# Patient Record
Sex: Female | Born: 1990
Health system: Southern US, Community
[De-identification: ages and names within clinical notes are randomized; demographics above are authoritative.]

## PROBLEM LIST (undated history)

## (undated) DIAGNOSIS — C801 Malignant (primary) neoplasm, unspecified: Secondary | ICD-10-CM

## (undated) HISTORY — PX: HERNIA REPAIR: SHX51

## (undated) HISTORY — PX: ANTERIOR CRUCIATE LIGAMENT REPAIR: SHX115

## (undated) HISTORY — PX: TONSILLECTOMY: SUR1361

---
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
## 2010-10-17 NOTE — Op Note (Signed)
NAME:  Leah Floyd, Leah Floyd NO.:  [id number]   MEDICAL RECORD NO.:  [id number]          PATIENT TYPE:  AMB   LOCATION:  DSC                          FACILITY:  [facility]   PHYSICIAN:  Thornton Park. Daphine Deutscher, MD  DATE OF BIRTH:  03-Jul-1990   DATE OF PROCEDURE:  02/17/2007  DATE OF DISCHARGE:                               OPERATIVE REPORT   PREOPERATIVE DIAGNOSIS:  Left inguinal hernia.   POSTOPERATIVE DIAGNOSIS:  Left indirect inguinal hernia.   PROCEDURE:  Left inguinal exploration, with opening and ligation of the  sac for an indirect hernia, closure of internal ring, division of  gubernaculum, and repair of floor with Ultrapro mesh.   SURGEON:  Thornton Park. Daphine Deutscher, M.D.   ASSISTANT:  None.   ANESTHESIA:  General.   DESCRIPTION OF PROCEDURE:  [name] was taken to room 8, [facility], and was given general by LMA.  The inguinal region was  clipped and then prepped with Techni-Care draped sterilely.  In the area  that we had marked and she had felt a bulge, I made a small transverse  incision and carried this down through the fatty tissue, opening her  external oblique from along the fibers down to the inguinal ligament.  Initially, the floor felt a little weak down there, but no obvious  indirect hernia was seen until I went ahead and dissected at the pubis  and then got around the very prominent wide cord, and I dissected it  proximally to the ring.  I opened it and then demonstrated a very nice  defined sac.  I took it distally and then peeled it away from the cord  structures and then ultimately ligating the distal gubernaculum with 2-0  Vicryl and then overclosing the internal portion of the sac with 2-0  Vicryl and then just tucking everything inside.  I then closed the  external oblique with a figure-of-eight suture of 2-0 Prolene.   Next, I cut a piece of Ultrapro mesh, and a little small piece was laid  down on the floor over the ring and again  tacked with 4 sutures of 2-0  Prolene.  All this did not encroach upon the nerve, which I separated,  and then closed the external oblique with running 2-0 Vicryl.  I  injected everything with 10 cc of 0.5% Marcaine.  Scarpa's was closed  with 4-0 Vicryl.  The wound was closed with 4-0 and 5-0 Vicryl in  layers, with a running subcuticular on the  outside, with Dermabond on the skin.  The patient tolerated the  procedure well.  She will be given Vicodin for pain, and is instructed  to return to the office in 2 weeks to discuss when she can go back to  playing basketball and her activities.      Thornton Park Daphine Deutscher, MD  Electronically Signed     MBM/MEDQ  D:  02/17/2007  T:  02/17/2007  Job:  [id number]   cc:   Clovis Riley, M.D.

## 2011-03-15 LAB — POCT HEMOGLOBIN-HEMACUE
Hemoglobin: 11.8 — ABNORMAL LOW
Operator id: 123881

## 2015-03-17 ENCOUNTER — Other Ambulatory Visit: Payer: Self-pay | Admitting: Family Medicine

## 2015-03-17 DIAGNOSIS — G4489 Other headache syndrome: Secondary | ICD-10-CM

## 2016-10-01 ENCOUNTER — Telehealth: Payer: Self-pay | Admitting: Hematology

## 2016-10-01 NOTE — Telephone Encounter (Signed)
Appt has been scheduled for the pt to see Dr. Irene Limbo on 5/1 at 230pm. Pt aware to arrive 30 minutes early. Demographics verified.

## 2016-10-02 ENCOUNTER — Encounter: Payer: Self-pay | Admitting: Hematology

## 2016-10-02 ENCOUNTER — Ambulatory Visit (HOSPITAL_BASED_OUTPATIENT_CLINIC_OR_DEPARTMENT_OTHER): Payer: Self-pay

## 2016-10-02 ENCOUNTER — Ambulatory Visit (HOSPITAL_BASED_OUTPATIENT_CLINIC_OR_DEPARTMENT_OTHER): Payer: Self-pay | Admitting: Hematology

## 2016-10-02 ENCOUNTER — Telehealth: Payer: Self-pay | Admitting: Hematology

## 2016-10-02 VITALS — BP 138/90 | HR 87 | Temp 98.4°F | Resp 18 | Ht 61.0 in | Wt 163.1 lb

## 2016-10-02 DIAGNOSIS — D509 Iron deficiency anemia, unspecified: Secondary | ICD-10-CM

## 2016-10-02 DIAGNOSIS — C8198 Hodgkin lymphoma, unspecified, lymph nodes of multiple sites: Secondary | ICD-10-CM

## 2016-10-02 DIAGNOSIS — J45909 Unspecified asthma, uncomplicated: Secondary | ICD-10-CM

## 2016-10-02 DIAGNOSIS — C8194 Hodgkin lymphoma, unspecified, lymph nodes of axilla and upper limb: Secondary | ICD-10-CM

## 2016-10-02 DIAGNOSIS — D649 Anemia, unspecified: Secondary | ICD-10-CM

## 2016-10-02 LAB — CBC & DIFF AND RETIC
BASO%: 0.2 % (ref 0.0–2.0)
Basophils Absolute: 0 10*3/uL (ref 0.0–0.1)
EOS%: 21.3 % — ABNORMAL HIGH (ref 0.0–7.0)
Eosinophils Absolute: 0.9 10*3/uL — ABNORMAL HIGH (ref 0.0–0.5)
HCT: 33.3 % — ABNORMAL LOW (ref 34.8–46.6)
HGB: 10.8 g/dL — ABNORMAL LOW (ref 11.6–15.9)
IMMATURE RETIC FRACT: 7.7 % (ref 1.60–10.00)
LYMPH#: 0.4 10*3/uL — AB (ref 0.9–3.3)
LYMPH%: 9.3 % — ABNORMAL LOW (ref 14.0–49.7)
MCH: 25.8 pg (ref 25.1–34.0)
MCHC: 32.4 g/dL (ref 31.5–36.0)
MCV: 79.7 fL (ref 79.5–101.0)
MONO#: 0.5 10*3/uL (ref 0.1–0.9)
MONO%: 10.2 % (ref 0.0–14.0)
NEUT%: 59 % (ref 38.4–76.8)
NEUTROS ABS: 2.6 10*3/uL (ref 1.5–6.5)
Platelets: 302 10*3/uL (ref 145–400)
RBC: 4.18 10*6/uL (ref 3.70–5.45)
RDW: 17.3 % — AB (ref 11.2–14.5)
RETIC %: 1.5 % (ref 0.70–2.10)
RETIC CT ABS: 62.7 10*3/uL (ref 33.70–90.70)
WBC: 4.4 10*3/uL (ref 3.9–10.3)

## 2016-10-02 LAB — COMPREHENSIVE METABOLIC PANEL
ALT: 8 U/L (ref 0–55)
AST: 13 U/L (ref 5–34)
Albumin: 3.2 g/dL — ABNORMAL LOW (ref 3.5–5.0)
Alkaline Phosphatase: 72 U/L (ref 40–150)
Anion Gap: 4 mEq/L (ref 3–11)
BUN: 6.2 mg/dL — ABNORMAL LOW (ref 7.0–26.0)
CO2: 26 meq/L (ref 22–29)
CREATININE: 0.7 mg/dL (ref 0.6–1.1)
Calcium: 9.3 mg/dL (ref 8.4–10.4)
Chloride: 104 mEq/L (ref 98–109)
EGFR: >90 mL/min/{1.73_m2} (ref >90)
GLUCOSE: 80 mg/dL (ref 70–140)
Potassium: 3.9 mEq/L (ref 3.5–5.1)
SODIUM: 135 meq/L — AB (ref 136–145)
TOTAL PROTEIN: 9.5 g/dL — AB (ref 6.4–8.3)

## 2016-10-02 LAB — LACTATE DEHYDROGENASE: LDH: 194 U/L (ref 125–245)

## 2016-10-02 NOTE — Patient Instructions (Signed)
Thank you for choosing [facility] to provide your oncology and hematology care.  To afford each patient quality time with our providers, please arrive 30 minutes before your scheduled appointment time.  If you arrive late for your appointment, you may be asked to reschedule.  We strive to give you quality time with our providers, and arriving late affects you and other patients whose appointments are after yours.  If you are a no show for multiple scheduled visits, you may be dismissed from the clinic at the providers discretion.   Again, thank you for choosing [facility], our hope is that these requests will decrease the amount of time that you wait before being seen by our physicians.  ______________________________________________________________________ Should you have questions after your visit to the [facility], please contact our office at [phone] between the hours of 8:30 and 4:30 p.m.    Voicemails left after 4:30p.m will not be returned until the following business day.   For prescription refill requests, please have your pharmacy contact us directly.  Please also try to allow 48 hours for prescription requests.   Please contact the scheduling department for questions regarding scheduling.  For scheduling of procedures such as PET scans, CT scans, MRI, Ultrasound, etc please contact central scheduling at [phone].   Resources For Cancer Patients and Caregivers:  American Cancer Society:  800-227-2345  Can help patients locate various types of support and financial assistance Cancer Care: 1-800-813-HOPE (4673) Provides financial assistance, online support groups, medication/co-pay assistance.   [place] DSS:  336-641-3447 Where to apply for food stamps, Medicaid, and utility assistance Medicare Rights Center: 800-333-4114 Helps people with Medicare understand their rights and benefits, navigate the Medicare system, and secure the  quality healthcare they deserve SCAT: 336-333-6589 [place] [employer]'s shared-ride transportation service for eligible riders who have a disability that prevents them from riding the fixed route bus.   For additional information on assistance programs please contact our social worker:   Grier Hock/Abigail Elmore:  [phone] 

## 2016-10-02 NOTE — Telephone Encounter (Signed)
Gave patient avs report and appointments for May, including PFT and echo. Central radiology will call re scans.

## 2016-10-02 NOTE — Progress Notes (Signed)
[name]    HEMATOLOGY/ONCOLOGY CONSULTATION NOTE  Date of Service: 10/02/2016  Patient Care Team: No Pcp Per Patient (General Practice)  CHIEF COMPLAINTS/PURPOSE OF CONSULTATION:  Newly diagnosed classical Hodgkin's lymphoma  HISTORY OF PRESENTING ILLNESS:   Leah Floyd is a wonderful 26 y.o. female who has been referred to [place] by [facility] for evaluation and management of newly diagnosed classical Hodgkin's lymphoma.  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  She notes that in November 2017 she developed generalized body aches and flulike symptoms with some subjective fevers chills and night sweats. A primary care physician noted a right axillary lump and she was treated empirically with Z-Pak for possible infection. She notes that around Christmas time in 2017 she developed worsening fatigue and generalized body aches a point that she was having difficulties getting through the day. She notes that she had an autoimmune workup HIV testing which was unrevealing she was noted to have some iron deficiency. She notes that she had a follow-up ultrasound that showed right axillary lymph node was enlarging and she also had a left axillary lymph node and had noted inguinal lymph nodes in the groin. Some of her workup was delayed due to insurance issues that she eventually had a left axillary lymph node biopsy on 09/24/2016 accession number [id number] which showed classical Hodgkin's lymphoma.  Patient then has moved back from [place] to be with her mother and family in [place] not, and is here to seek further cares for her classical Hodgkin's lymphoma newly diagnosed.  Patient notes significant fatigue, night sweats, weight loss of about 40 pounds in the last 4-5 months, generalized body aches,  dry cough and loss of appetite.  She notes that she previously had heavy menstrual periods lasting 7 days which are now become lighter and last about 4 days. She has been taking ferrous sulfate plus vitamin C for her iron deficiency anemia.  No previous heart problems. Has been having some mild diarrhea which she describes as soft stools 2-3 times a day.  MEDICAL HISTORY:   #1 iron deficiency anemia #2 GERD #3 obesity. #4 pseudotumor cerebri however this has resolved with her weight loss as per patient. #5 asthma #6 of newly diagnosed Hodgkin's lymphoma #7 shellfish allergy #8 history of Chlamydia infection   SURGICAL HISTORY: Past Surgical History:  Procedure Laterality Date  . ANTERIOR CRUCIATE LIGAMENT REPAIR     left  . HERNIA REPAIR     left    SOCIAL HISTORY: Social History   Social History  . Marital status: Single    Spouse name: N/A  . Number of children: N/A  . Years of education: N/A   Occupational History  . Not on file.   Social History Main Topics  . Smoking status: Never Smoker  . Smokeless tobacco: Never Used  . Alcohol use Yes     Comment: socially  . Drug use: Yes    Types: Marijuana     Comment: medical   . Sexual activity: Not on file   Other Topics Concern  . Not on file   Social History Narrative  . No narrative on file    FAMILY HISTORY: Notes her first cousin on her mom's side had some form off rare bone marrow cancer Does not know history on her father's side of the family Maternal grandmother-hypertension,  diabetes, fibromyalgia Mother anemia Other relatives on her mom's side with lymphoma and bladder cancer.  ALLERGIES:  is allergic to shellfish-derived products.  MEDICATIONS:  Current Outpatient Prescriptions  Medication Sig Dispense Refill  . Acetaminophen-Pamabrom (WOMENS TYLENOL PO) Take by mouth.    . Ascorbic Acid (VITAMIN C PO) Take by mouth.    . Cholecalciferol (VITAMIN D PO) Take by mouth.    . IRON PO Take  by mouth.     No current facility-administered medications for this visit.     REVIEW OF SYSTEMS:    10 Point review of Systems was done is negative except as noted above.  PHYSICAL EXAMINATION: ECOG PERFORMANCE STATUS: 1 - Symptomatic but completely ambulatory  . Vitals:   10/02/16 1437  BP: 138/90  Pulse: 87  Resp: 18  Temp: 98.4 F (36.9 C)   Filed Weights   10/02/16 1437  Weight: 163 lb 2 oz (74 kg)   .Body mass index is 30.82 kg/m.  GENERAL:alert, in no acute distress and comfortable SKIN: no acute rashes, no significant lesions EYES: conjunctiva are pink and non-injected, sclera anicteric OROPHARYNX: MMM, no exudates, no oropharyngeal erythema or ulceration NECK: supple, no JVD LYMPH:  Patient has palpable lymphadenopathy bilaterally in her neck , occipital area, supraclavicular, bilateral axillary and inguinal areas  LUNGS: clear to auscultation b/l with normal respiratory effort HEART: regular rate & rhythm ABDOMEN:  normoactive bowel sounds , non tender, not distended. Borderline palpable splenomegaly, no palpable hepatomegaly. Extremity: no pedal edema PSYCH: alert & oriented x 3 with fluent speech NEURO: no focal motor/sensory deficits  LABORATORY DATA:  I have reviewed the data as listed  . CBC Latest Ref Rng & Units 10/02/2016 02/17/2007  WBC 3.9 - 10.3 10e3/uL 4.4 -  Hemoglobin 11.6 - 15.9 g/dL 10.8(L) 11.8(L)  Hematocrit 34.8 - 46.6 % 33.3(L) -  Platelets 145 - 400 10e3/uL 302 -   . CMP Latest Ref Rng & Units 10/02/2016  Glucose 70 - 140 mg/dl 80  BUN 7.0 - 26.0 mg/dL 6.2(L)  Creatinine 0.6 - 1.1 mg/dL 0.7  Sodium 136 - 145 mEq/L 135(L)  Potassium 3.5 - 5.1 mEq/L 3.9  CO2 22 - 29 mEq/L 26  Calcium 8.4 - 10.4 mg/dL 9.3  Total Protein 6.4 - 8.3 g/dL 9.5(H)  Total Bilirubin 0.20 - 1.20 mg/dL <0.22  Alkaline Phos 40 - 150 U/L 72  AST 5 - 34 U/L 13  ALT 0 - 55 U/L 8   . Lab Results  Component Value Date   LDH 194 10/02/2016      RADIOGRAPHIC STUDIES: I have personally reviewed the radiological images as listed and agreed with the findings in the report. No results found.  ASSESSMENT & PLAN:   26 year old female with   #1 Classical Hodgkin's lymphoma  Clinically appears to be at least stage III B Patient has constitutional symptoms with significant weight loss of 40 pounds, and night sweats and debilitating fatigue. PLAN -Pathology results were reviewed in detail with the patient We discussed in detail diagnosis, natural history, prognosis based on likely stage, treatment option and potential adverse effects, possible impact on fertility etc. -Patient is agreeable with Port-A-Cath placement for chemotherapy -ordered -Labs today PET/CT ASAP in 5 days ECHO ASAP in 5 days PFT's in 5 days Chemo-counseling for ABVD for Hodgkins lymphoma in 1 week ABVD to be started in about 8-10 days RTC with Dr Irene Limbo C1D1 with labs  #2 fertility preservation -We discussed that ABVD regimen might have up to  a 10% chances of loss of fertility. -We discussed options for using Lupron could try to reduce ovarian injury. -We discussed options for egg preservation and local resources available. Patient is not inclined to pursue oocyte/egg preservation at this time. -She was counseled about the use of Lupron and the potential adverse effects and advantages. She wants to think about this and let us know.  #3 history of iron deficiency anemia Has been on oral iron that causes significant constipation Plan -We'll get iron studies and treat accordingly  #4 history of asthma. This has been mild but tends to get worse in [place]. Plan -she does not have a primary care physician at this time. 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 Patient notes that she tolerates eating shrimp. -Prescribed EpiPen for when necessary use. -Recommended also to carry  Benadryl.  #6 general medical cares -Patient was recommended to establish a primary care physician as soon as possible.   All, subjective fevers and chills and debilitating fatigue of the patients questions were answered with apparent satisfaction. The patient knows to call the clinic with any problems, questions or concerns.  I spent 60 minutes counseling the patient face to face. The total time spent in the appointment was 80 minutes and more than 50% was on counseling and direct patient cares.    Sullivan Lone MD [place] AAHIVMS [facility] Hematology/Oncology Physician [facility]  (Office):       [phone] (Work cell):  [phone] (Fax):           [phone]  10/02/2016 2:44 PM

## 2016-10-03 ENCOUNTER — Ambulatory Visit (HOSPITAL_COMMUNITY)
Admission: RE | Admit: 2016-10-03 | Discharge: 2016-10-03 | Disposition: A | Discharge status: Home / Self Care | Payer: Self-pay | Source: Ambulatory Visit | Attending: Hematology | Admitting: Hematology

## 2016-10-03 ENCOUNTER — Encounter: Payer: Self-pay | Admitting: *Deleted

## 2016-10-03 ENCOUNTER — Other Ambulatory Visit: Payer: Self-pay | Admitting: General Surgery

## 2016-10-03 DIAGNOSIS — C8198 Hodgkin lymphoma, unspecified, lymph nodes of multiple sites: Secondary | ICD-10-CM | POA: Insufficient documentation

## 2016-10-03 LAB — FERRITIN: FERRITIN: 10 ng/mL (ref 9–269)

## 2016-10-03 LAB — IRON AND TIBC
%SAT: 7 % — AB (ref 21–57)
IRON: 26 ug/dL — AB (ref 41–142)
TIBC: 349 ug/dL (ref 236–444)
UIBC: 323 ug/dL (ref 120–384)

## 2016-10-03 LAB — HEPATITIS B CORE ANTIBODY, TOTAL: Hep B Core Ab, Tot: NEGATIVE

## 2016-10-03 LAB — HEPATITIS C ANTIBODY: Hep C Virus Ab: <0.1 s/co ratio (ref 0.0–0.9)

## 2016-10-03 LAB — SEDIMENTATION RATE: Sedimentation Rate-Westergren: 54 mm/hr — ABNORMAL HIGH (ref 0–32)

## 2016-10-03 LAB — HIV ANTIBODY (ROUTINE TESTING W REFLEX): HIV SCREEN 4TH GENERATION: NONREACTIVE

## 2016-10-03 LAB — HEPATITIS B SURFACE ANTIGEN: HEP B S AG: NEGATIVE

## 2016-10-03 MED ORDER — EPINEPHRINE 0.3 MG/0.3ML IJ SOAJ: 0.3000 mg | Freq: Once | INTRAMUSCULAR | 0 refills | Status: AC

## 2016-10-03 MED ORDER — ALBUTEROL SULFATE HFA 108 (90 BASE) MCG/ACT IN AERS: 2.0000 | INHALATION_SPRAY | Freq: Four times a day (QID) | RESPIRATORY_TRACT | 2 refills | Status: AC | PRN

## 2016-10-03 NOTE — Progress Notes (Signed)
  Echocardiogram 2D Echocardiogram has been performed.  Roseanna Rainbow R 10/03/2016, 10:00 AM

## 2016-10-04 ENCOUNTER — Other Ambulatory Visit: Payer: Self-pay | Admitting: Student

## 2016-10-04 ENCOUNTER — Ambulatory Visit (HOSPITAL_COMMUNITY)
Admission: RE | Admit: 2016-10-04 | Discharge: 2016-10-04 | Disposition: A | Discharge status: Home / Self Care | Payer: Self-pay | Source: Ambulatory Visit | Attending: Hematology | Admitting: Hematology

## 2016-10-04 ENCOUNTER — Encounter: Payer: Self-pay | Admitting: *Deleted

## 2016-10-04 ENCOUNTER — Encounter (HOSPITAL_COMMUNITY): Payer: Self-pay

## 2016-10-04 ENCOUNTER — Other Ambulatory Visit: Payer: Self-pay | Admitting: Hematology

## 2016-10-04 DIAGNOSIS — C8198 Hodgkin lymphoma, unspecified, lymph nodes of multiple sites: Secondary | ICD-10-CM

## 2016-10-04 DIAGNOSIS — D509 Iron deficiency anemia, unspecified: Secondary | ICD-10-CM | POA: Insufficient documentation

## 2016-10-04 DIAGNOSIS — K219 Gastro-esophageal reflux disease without esophagitis: Secondary | ICD-10-CM | POA: Insufficient documentation

## 2016-10-04 DIAGNOSIS — C8190 Hodgkin lymphoma, unspecified, unspecified site: Secondary | ICD-10-CM | POA: Insufficient documentation

## 2016-10-04 DIAGNOSIS — J45909 Unspecified asthma, uncomplicated: Secondary | ICD-10-CM | POA: Insufficient documentation

## 2016-10-04 DIAGNOSIS — E669 Obesity, unspecified: Secondary | ICD-10-CM | POA: Insufficient documentation

## 2016-10-04 DIAGNOSIS — F1290 Cannabis use, unspecified, uncomplicated: Secondary | ICD-10-CM | POA: Insufficient documentation

## 2016-10-04 HISTORY — PX: IR US GUIDE VASC ACCESS RIGHT: IMG2390

## 2016-10-04 HISTORY — PX: IR FLUORO GUIDE PORT INSERTION RIGHT: IMG5741

## 2016-10-04 LAB — CBC WITH DIFFERENTIAL/PLATELET
BASOS PCT: 0 %
Basophils Absolute: 0 10*3/uL (ref 0.0–0.1)
EOS ABS: 1.2 10*3/uL — AB (ref 0.0–0.7)
Eosinophils Relative: 20 %
HCT: 33.4 % — ABNORMAL LOW (ref 36.0–46.0)
Hemoglobin: 10.8 g/dL — ABNORMAL LOW (ref 12.0–15.0)
Lymphocytes Relative: 11 %
Lymphs Abs: 0.7 10*3/uL (ref 0.7–4.0)
MCH: 25.5 pg — AB (ref 26.0–34.0)
MCHC: 32.3 g/dL (ref 30.0–36.0)
MCV: 79 fL (ref 78.0–100.0)
MONO ABS: 0.6 10*3/uL (ref 0.1–1.0)
MONOS PCT: 10 %
Neutro Abs: 3.5 10*3/uL (ref 1.7–7.7)
Neutrophils Relative %: 59 %
Platelets: 334 10*3/uL (ref 150–400)
RBC: 4.23 MIL/uL (ref 3.87–5.11)
RDW: 17.4 % — AB (ref 11.5–15.5)
WBC: 5.9 10*3/uL (ref 4.0–10.5)

## 2016-10-04 LAB — PROTIME-INR
INR: 1.13
Prothrombin Time: 14.6 seconds (ref 11.4–15.2)

## 2016-10-04 MED ORDER — HEPARIN SOD (PORK) LOCK FLUSH 100 UNIT/ML IV SOLN
INTRAVENOUS | Status: AC
  Administered 2016-10-04: 10:00:00
  Filled 2016-10-04: qty 5

## 2016-10-04 MED ORDER — FENTANYL CITRATE (PF) 100 MCG/2ML IJ SOLN
INTRAMUSCULAR | Status: AC
  Filled 2016-10-04: qty 8

## 2016-10-04 MED ORDER — MIDAZOLAM HCL 2 MG/2ML IJ SOLN
INTRAMUSCULAR | Status: AC | PRN
  Administered 2016-10-04 (×3): 1 mg via INTRAVENOUS

## 2016-10-04 MED ORDER — CEFAZOLIN SODIUM-DEXTROSE 2-4 GM/100ML-% IV SOLN
2.0000 g | INTRAVENOUS | Status: AC
  Administered 2016-10-04: 2 g via INTRAVENOUS

## 2016-10-04 MED ORDER — MIDAZOLAM HCL 2 MG/2ML IJ SOLN
INTRAMUSCULAR | Status: AC
  Filled 2016-10-04: qty 8

## 2016-10-04 MED ORDER — FENTANYL CITRATE (PF) 100 MCG/2ML IJ SOLN
INTRAMUSCULAR | Status: AC | PRN
  Administered 2016-10-04 (×2): 25 ug via INTRAVENOUS
  Administered 2016-10-04: 50 ug via INTRAVENOUS

## 2016-10-04 MED ORDER — SODIUM CHLORIDE 0.9 % IV SOLN
INTRAVENOUS | Status: DC
  Administered 2016-10-04: 08:00:00 via INTRAVENOUS

## 2016-10-04 MED ORDER — CEFAZOLIN SODIUM-DEXTROSE 2-4 GM/100ML-% IV SOLN
INTRAVENOUS | Status: AC
  Filled 2016-10-04: qty 100

## 2016-10-04 MED ORDER — LIDOCAINE-EPINEPHRINE (PF) 2 %-1:200000 IJ SOLN
INTRAMUSCULAR | Status: AC
  Filled 2016-10-04: qty 20

## 2016-10-04 MED ORDER — LIDOCAINE-EPINEPHRINE (PF) 2 %-1:200000 IJ SOLN
INTRAMUSCULAR | Status: AC | PRN
  Administered 2016-10-04: 10 mL via INTRADERMAL

## 2016-10-04 NOTE — Discharge Instructions (Signed)
Moderate Conscious Sedation, Adult, Care After °These instructions provide you with information about caring for yourself after your procedure. Your health care provider may also give you more specific instructions. Your treatment has been planned according to current medical practices, but problems sometimes occur. Call your health care provider if you have any problems or questions after your procedure. °What can I expect after the procedure? °After your procedure, it is common: °· To feel sleepy for several hours. °· To feel clumsy and have poor balance for several hours. °· To have poor judgment for several hours. °· To vomit if you eat too soon. °Follow these instructions at home: °For at least 24 hours after the procedure:  ° °· Do not: °¨ Participate in activities where you could fall or become injured. °¨ Drive. °¨ Use heavy machinery. °¨ Drink alcohol. °¨ Take sleeping pills or medicines that cause drowsiness. °¨ Make important decisions or sign legal documents. °¨ Take care of children on your own. °· Rest. °Eating and drinking  °· Follow the diet recommended by your health care provider. °· If you vomit: °¨ Drink water, juice, or soup when you can drink without vomiting. °¨ Make sure you have little or no nausea before eating solid foods. °General instructions  °· Have a responsible adult stay with you until you are awake and alert. °· Take over-the-counter and prescription medicines only as told by your health care provider. °· If you smoke, do not smoke without supervision. °· Keep all follow-up visits as told by your health care provider. This is important. °Contact a health care provider if: °· You keep feeling nauseous or you keep vomiting. °· You feel light-headed. °· You develop a rash. °· You have a fever. °Get help right away if: °· You have trouble breathing. °This information is not intended to replace advice given to you by your health care provider. Make sure you discuss any questions you  have with your health care provider. °Document Released: 03/11/2013 Document Revised: 10/24/2015 Document Reviewed: 09/10/2015 °Elsevier Interactive Patient Education © 2017 Elsevier Inc[employer] ° ° °Implanted Port Insertion, Care After °This sheet gives you information about how to care for yourself after your procedure. Your health care provider may also give you more specific instructions. If you have problems or questions, contact your health care provider. °What can I expect after the procedure? °After your procedure, it is common to have: °· Discomfort at the port insertion site. °· Bruising on the skin over the port. This should improve over 3-4 days. °Follow these instructions at home: °Port care  °· After your port is placed, you will get a manufacturer's information card. The card has information about your port. Keep this card with you at all times. °· Take care of the port as told by your health care provider. Ask your health care provider if you or a family member can get training for taking care of the port at home. A home health care nurse may also take care of the port. °· Make sure to remember what type of port you have. °Incision care  °· Follow instructions from your health care provider about how to take care of your port insertion site. Make sure you: °¨ Wash your hands with soap and water before you change your bandage (dressing). If soap and water are not available, use hand sanitizer. °¨ Change your dressing as told by your health care provider. °¨ Leave stitches (sutures), skin glue, or adhesive strips in place. These skin   closures may need to stay in place for 2 weeks or longer. If adhesive strip edges start to loosen and curl up, you may trim the loose edges. Do not remove adhesive strips completely unless your health care provider tells you to do that. °· Check your port insertion site every day for signs of infection. Check for: °¨ More redness, swelling, or pain. °¨ More fluid or  blood. °¨ Warmth. °¨ Pus or a bad smell. °General instructions  °· Do not take baths, swim, or use a hot tub until your health care provider approves. °· Do not lift anything that is heavier than 10 lb (4.5 kg) for a week, or as told by your health care provider. °· Ask your health care provider when it is okay to: °¨ Return to work or school. °¨ Resume usual physical activities or sports. °· Do not drive for 24 hours if you were given a medicine to help you relax (sedative). °· Take over-the-counter and prescription medicines only as told by your health care provider. °· Wear a medical alert bracelet in case of an emergency. This will tell any health care providers that you have a port. °· Keep all follow-up visits as told by your health care provider. This is important. °Contact a health care provider if: °· You cannot flush your port with saline as directed, or you cannot draw blood from the port. °· You have a fever or chills. °· You have more redness, swelling, or pain around your port insertion site. °· You have more fluid or blood coming from your port insertion site. °· Your port insertion site feels warm to the touch. °· You have pus or a bad smell coming from the port insertion site. °Get help right away if: °· You have chest pain or shortness of breath. °· You have bleeding from your port that you cannot control. °Summary °· Take care of the port as told by your health care provider. °· Change your dressing as told by your health care provider. °· Keep all follow-up visits as told by your health care provider. °This information is not intended to replace advice given to you by your health care provider. Make sure you discuss any questions you have with your health care provider. °Document Released: 03/11/2013 Document Revised: 04/11/2016 Document Reviewed: 04/11/2016 °Elsevier Interactive Patient Education © 2017 Elsevier Inc. ° °

## 2016-10-04 NOTE — Consult Note (Signed)
Chief Complaint: Patient was seen in consultation today for Port-A-Cath placement   Referring Physician(s): Brunetta Genera  Supervising Physician: Sandi Mariscal  Patient Status: [facility]  History of Present Illness: Leah Floyd is a 26 y.o. female with history of newly diagnosed classical Hodgkin's lymphoma who presents today for Port-A-Cath placement for chemotherapy. Past medical history also significant for obesity/pseudotumor cerebra which is now resolved with weight loss, iron deficiency anemia, asthma and GERD.  History reviewed. No pertinent past medical history.  Past Surgical History:  Procedure Laterality Date  . ANTERIOR CRUCIATE LIGAMENT REPAIR     left  . HERNIA REPAIR     left    Allergies: Shellfish-derived products  Medications: Prior to Admission medications   Medication Sig Start Date End Date Taking? Authorizing Provider  Acetaminophen-Pamabrom (WOMENS TYLENOL PO) Take by mouth.   Yes Historical Provider, MD  albuterol (PROVENTIL HFA;VENTOLIN HFA) 108 (90 Base) MCG/ACT inhaler Inhale 2 puffs into the lungs every 6 (six) hours as needed for wheezing or shortness of breath. 10/03/16  Yes Brunetta Genera, MD  Ascorbic Acid (VITAMIN C PO) Take by mouth.   Yes Historical Provider, MD  Cholecalciferol (VITAMIN D PO) Take by mouth.   Yes Historical Provider, MD  IRON PO Take by mouth.   Yes Historical Provider, MD  EPINEPHrine 0.3 mg/0.3 mL IJ SOAJ injection Inject 0.3 mLs (0.3 mg total) into the muscle once. For anaphylactic reactions 10/03/16 10/03/16  Brunetta Genera, MD     History reviewed. No pertinent family history.  Social History   Social History  . Marital status: Single    Spouse name: N/A  . Number of children: N/A  . Years of education: N/A   Social History Main Topics  . Smoking status: Never Smoker  . Smokeless tobacco: Never Used  . Alcohol use Yes     Comment: socially  . Drug use: Yes    Types: Marijuana   Comment: medical   . Sexual activity: Not Asked   Other Topics Concern  . None   Social History Narrative  . None      Review of Systems denies fever, headache, vomiting or abnormal bleeding. She does have occasional left-sided chest discomfort, some dyspnea with exertion, occasional cough, back pain, occasional nausea as well as some nosebleeds, weight loss and night sweats.  Vital Signs: BP (!) 147/81 (BP Location: Right Arm)   Pulse 85   Temp 98 F (36.7 C) (Oral)   Resp 16   SpO2 99%   Physical Exam awake, alert. Chest clear to auscultation bilaterally. Heart with regular rate and rhythm. Abdomen soft, positive bowel sounds, mild generalized tenderness, no lower extremity edema  Mallampati Score:     Imaging: No results found.  Labs:  CBC:  Recent Labs  10/02/16 1604 10/04/16 0747  WBC 4.4 5.9  HGB 10.8* 10.8*  HCT 33.3* 33.4*  PLT 302 334    COAGS:  Recent Labs  10/04/16 0747  INR 1.13    BMP:  Recent Labs  10/02/16 1604  NA 135*  K 3.9  CO2 26  GLUCOSE 80  BUN 6.2*  CALCIUM 9.3  CREATININE 0.7    LIVER FUNCTION TESTS:  Recent Labs  10/02/16 1604  BILITOT <0.22  AST 13  ALT 8  ALKPHOS 72  PROT 9.5*  ALBUMIN 3.2*    TUMOR MARKERS: No results for input(s): AFPTM, CEA, CA199, CHROMGRNA in the last 8760 hours.  Assessment and Plan: History of  newly diagnosed classical Hodgkin's lymphoma; plan today is for Port-A-Cath placement for chemotherapy.Risks and benefits discussed with the patient/mother including, but not limited to bleeding, infection, pneumothorax, or fibrin sheath development and need for additional procedures.All of the patient's questions were answered, patient is agreeable to proceed.Consent signed and in chart.     Thank you for this interesting consult.  I greatly enjoyed meeting Leah Floyd and look forward to participating in their care.  A copy of this report was sent to the requesting provider on this  date.  Electronically Signed: D. Rowe Robert 10/04/2016, 8:35 AM   I spent a total of 20 minutes  in face to face in clinical consultation, greater than 50% of which was counseling/coordinating care for Port-A-Cath placement

## 2016-10-04 NOTE — Procedures (Signed)
Pre Procedure Dx: Lymphoma Post Procedural Dx: Same  Successful placement of right IJ approach port-a-cath with tip at the superior caval atrial junction. The catheter is ready for immediate use.  Estimated Blood Loss: Minimal  Complications: None immediate.  Jay Charlesia Canaday, MD Pager #: [phone]   

## 2016-10-05 ENCOUNTER — Telehealth: Payer: Self-pay | Admitting: *Deleted

## 2016-10-05 ENCOUNTER — Ambulatory Visit (HOSPITAL_COMMUNITY)
Admission: RE | Admit: 2016-10-05 | Discharge: 2016-10-05 | Disposition: A | Discharge status: Home / Self Care | Payer: Self-pay | Source: Ambulatory Visit | Attending: Hematology | Admitting: Hematology

## 2016-10-05 ENCOUNTER — Other Ambulatory Visit: Payer: Self-pay

## 2016-10-05 DIAGNOSIS — J984 Other disorders of lung: Secondary | ICD-10-CM | POA: Insufficient documentation

## 2016-10-05 DIAGNOSIS — C8198 Hodgkin lymphoma, unspecified, lymph nodes of multiple sites: Secondary | ICD-10-CM

## 2016-10-05 LAB — PULMONARY FUNCTION TEST
DL/VA % PRED: 139 %
DL/VA: 6.14 ml/min/mmHg/L
DLCO COR % PRED: 135 %
DLCO cor: 27.42 ml/min/mmHg
DLCO unc % pred: 123 %
DLCO unc: 24.95 ml/min/mmHg
FEF 25-75 PRE: 4.69 L/s
FEF 25-75 Post: 4.66 L/sec
FEF2575-%Change-Post: 0 %
FEF2575-%Pred-Post: 148 %
FEF2575-%Pred-Pre: 149 %
FEV1-%Change-Post: 1 %
FEV1-%PRED-POST: 127 %
FEV1-%Pred-Pre: 125 %
FEV1-Post: 3.23 L
FEV1-Pre: 3.19 L
FEV1FVC-%CHANGE-POST: -1 %
FEV1FVC-%Pred-Pre: 110 %
FEV6-%CHANGE-POST: 4 %
FEV6-%Pred-Post: 119 %
FEV6-%Pred-Pre: 114 %
FEV6-Post: 3.44 L
FEV6-Pre: 3.3 L
FEV6FVC-%PRED-POST: 101 %
FEV6FVC-%PRED-PRE: 101 %
FVC-%CHANGE-POST: 3 %
FVC-%PRED-PRE: 114 %
FVC-%Pred-Post: 118 %
FVC-POST: 3.46 L
FVC-PRE: 3.36 L
POST FEV1/FVC RATIO: 93 %
POST FEV6/FVC RATIO: 100 %
PRE FEV1/FVC RATIO: 95 %
Pre FEV6/FVC Ratio: 100 %
RV % PRED: 113 %
RV: 1.32 L
TLC % pred: 100 %
TLC: 4.63 L

## 2016-10-05 MED ORDER — ALBUTEROL SULFATE (2.5 MG/3ML) 0.083% IN NEBU
2.5000 mg | INHALATION_SOLUTION | Freq: Once | RESPIRATORY_TRACT | Status: AC
  Administered 2016-10-05: 2.5 mg via RESPIRATORY_TRACT

## 2016-10-05 NOTE — Telephone Encounter (Signed)
Received call from [facility] with questions about PFT's  She is scheduled for today @ 11 am.  Does she need 6 min walk & mip/mep?  These don't really match her diagnosis.  Discussed with Dr Irene Limbo & pt does not need these two test.  Informed Tara/Respiratory.

## 2016-10-09 ENCOUNTER — Other Ambulatory Visit: Payer: Self-pay | Admitting: *Deleted

## 2016-10-10 ENCOUNTER — Other Ambulatory Visit: Payer: Self-pay | Admitting: Hematology

## 2016-10-10 ENCOUNTER — Encounter (HOSPITAL_COMMUNITY)
Admission: RE | Admit: 2016-10-10 | Discharge: 2016-10-10 | Disposition: A | Discharge status: Home / Self Care | Payer: Self-pay | Source: Ambulatory Visit | Attending: Hematology | Admitting: Hematology

## 2016-10-10 DIAGNOSIS — C8190 Hodgkin lymphoma, unspecified, unspecified site: Secondary | ICD-10-CM | POA: Insufficient documentation

## 2016-10-10 DIAGNOSIS — C8198 Hodgkin lymphoma, unspecified, lymph nodes of multiple sites: Secondary | ICD-10-CM

## 2016-10-10 LAB — GLUCOSE, CAPILLARY: Glucose-Capillary: 91 mg/dL (ref 65–99)

## 2016-10-10 MED ORDER — FLUDEOXYGLUCOSE F - 18 (FDG) INJECTION
8.8000 | Freq: Once | INTRAVENOUS | Status: AC | PRN
  Administered 2016-10-10: 8.8 via INTRAVENOUS

## 2016-10-10 NOTE — Progress Notes (Signed)
START ON PATHWAY REGIMEN - Lymphoma and CLL     A cycle is every 28 days:     Doxorubicin      Dacarbazine      Vinblastine      Bleomycin   **Always confirm dose/schedule in your pharmacy ordering system**    Patient Characteristics: Classic Hodgkin Lymphoma, First Line, Stage III / IV Disease Type: Classic Hodgkin Lymphoma Disease Type: Not Applicable Line of therapy: First Line [place] Stage: IVB  Intent of Therapy: Curative Intent, Discussed with Patient

## 2016-10-11 ENCOUNTER — Other Ambulatory Visit: Payer: Self-pay | Admitting: *Deleted

## 2016-10-11 DIAGNOSIS — C8190 Hodgkin lymphoma, unspecified, unspecified site: Secondary | ICD-10-CM

## 2016-10-12 ENCOUNTER — Ambulatory Visit (HOSPITAL_BASED_OUTPATIENT_CLINIC_OR_DEPARTMENT_OTHER): Payer: Self-pay | Admitting: Hematology

## 2016-10-12 ENCOUNTER — Other Ambulatory Visit (HOSPITAL_COMMUNITY)
Admission: RE | Admit: 2016-10-12 | Discharge: 2016-10-12 | Disposition: A | Discharge status: Home / Self Care | Payer: Self-pay | Source: Ambulatory Visit | Attending: Hematology | Admitting: Hematology

## 2016-10-12 ENCOUNTER — Encounter: Payer: Self-pay | Admitting: Hematology

## 2016-10-12 ENCOUNTER — Ambulatory Visit (HOSPITAL_BASED_OUTPATIENT_CLINIC_OR_DEPARTMENT_OTHER): Payer: Self-pay

## 2016-10-12 ENCOUNTER — Telehealth: Payer: Self-pay | Admitting: Hematology

## 2016-10-12 ENCOUNTER — Other Ambulatory Visit (HOSPITAL_BASED_OUTPATIENT_CLINIC_OR_DEPARTMENT_OTHER): Payer: Self-pay

## 2016-10-12 VITALS — BP 139/78 | HR 73 | Temp 98.3°F | Resp 16

## 2016-10-12 VITALS — BP 134/92 | HR 66 | Temp 98.5°F | Resp 19 | Ht 61.0 in | Wt 162.2 lb

## 2016-10-12 DIAGNOSIS — C8198 Hodgkin lymphoma, unspecified, lymph nodes of multiple sites: Secondary | ICD-10-CM

## 2016-10-12 DIAGNOSIS — Z5111 Encounter for antineoplastic chemotherapy: Secondary | ICD-10-CM

## 2016-10-12 DIAGNOSIS — C8190 Hodgkin lymphoma, unspecified, unspecified site: Secondary | ICD-10-CM

## 2016-10-12 DIAGNOSIS — D500 Iron deficiency anemia secondary to blood loss (chronic): Secondary | ICD-10-CM | POA: Insufficient documentation

## 2016-10-12 DIAGNOSIS — C8194 Hodgkin lymphoma, unspecified, lymph nodes of axilla and upper limb: Secondary | ICD-10-CM

## 2016-10-12 DIAGNOSIS — D509 Iron deficiency anemia, unspecified: Secondary | ICD-10-CM

## 2016-10-12 LAB — CBC WITH DIFFERENTIAL/PLATELET
BASO%: 0.4 % (ref 0.0–2.0)
Basophils Absolute: 0 10*3/uL (ref 0.0–0.1)
EOS ABS: 1.1 10*3/uL — AB (ref 0.0–0.5)
EOS%: 23.7 % — ABNORMAL HIGH (ref 0.0–7.0)
HCT: 32.1 % — ABNORMAL LOW (ref 34.8–46.6)
HEMOGLOBIN: 10.2 g/dL — AB (ref 11.6–15.9)
LYMPH%: 10.9 % — ABNORMAL LOW (ref 14.0–49.7)
MCH: 25.8 pg (ref 25.1–34.0)
MCHC: 31.8 g/dL (ref 31.5–36.0)
MCV: 81.1 fL (ref 79.5–101.0)
MONO#: 0.4 10*3/uL (ref 0.1–0.9)
MONO%: 8.8 % (ref 0.0–14.0)
NEUT%: 56.2 % (ref 38.4–76.8)
NEUTROS ABS: 2.6 10*3/uL (ref 1.5–6.5)
Platelets: 247 10*3/uL (ref 145–400)
RBC: 3.96 10*6/uL (ref 3.70–5.45)
RDW: 18 % — AB (ref 11.2–14.5)
WBC: 4.7 10*3/uL (ref 3.9–10.3)
lymph#: 0.5 10*3/uL — ABNORMAL LOW (ref 0.9–3.3)

## 2016-10-12 LAB — COMPREHENSIVE METABOLIC PANEL
ALBUMIN: 2.9 g/dL — AB (ref 3.5–5.0)
ALK PHOS: 81 U/L (ref 40–150)
ALT: 8 U/L (ref 0–55)
AST: 13 U/L (ref 5–34)
Anion Gap: 4 mEq/L (ref 3–11)
BUN: 5.7 mg/dL — ABNORMAL LOW (ref 7.0–26.0)
CO2: 27 mEq/L (ref 22–29)
CREATININE: 0.7 mg/dL (ref 0.6–1.1)
Calcium: 9.2 mg/dL (ref 8.4–10.4)
Chloride: 105 mEq/L (ref 98–109)
EGFR: >90 mL/min/{1.73_m2} (ref >90)
GLUCOSE: 89 mg/dL (ref 70–140)
Potassium: 4.3 mEq/L (ref 3.5–5.1)
Sodium: 136 mEq/L (ref 136–145)
TOTAL PROTEIN: 9 g/dL — AB (ref 6.4–8.3)

## 2016-10-12 LAB — PREGNANCY, URINE: Preg Test, Ur: NEGATIVE

## 2016-10-12 MED ORDER — DEXAMETHASONE 4 MG PO TABS: ORAL_TABLET | ORAL | 1 refills | Status: DC

## 2016-10-12 MED ORDER — PROCHLORPERAZINE MALEATE 10 MG PO TABS: 10.0000 mg | ORAL_TABLET | Freq: Four times a day (QID) | ORAL | 1 refills | Status: DC | PRN

## 2016-10-12 MED ORDER — ALBUTEROL SULFATE (2.5 MG/3ML) 0.083% IN NEBU
INHALATION_SOLUTION | RESPIRATORY_TRACT | Status: AC
  Filled 2016-10-12: qty 3

## 2016-10-12 MED ORDER — HYDROCODONE-ACETAMINOPHEN 5-325 MG PO TABS: 1.0000 | ORAL_TABLET | Freq: Four times a day (QID) | ORAL | 0 refills | Status: DC | PRN

## 2016-10-12 MED ORDER — SENNOSIDES-DOCUSATE SODIUM 8.6-50 MG PO TABS: 2.0000 | ORAL_TABLET | Freq: Every day | ORAL | 1 refills | Status: DC

## 2016-10-12 MED ORDER — LIDOCAINE-PRILOCAINE 2.5-2.5 % EX CREA: TOPICAL_CREAM | CUTANEOUS | 3 refills | Status: DC

## 2016-10-12 MED ORDER — DOXORUBICIN HCL CHEMO IV INJECTION 2 MG/ML
25.0000 mg/m2 | Freq: Once | INTRAVENOUS | Status: AC
  Administered 2016-10-12: 44 mg via INTRAVENOUS
  Filled 2016-10-12: qty 22

## 2016-10-12 MED ORDER — PALONOSETRON HCL INJECTION 0.25 MG/5ML
INTRAVENOUS | Status: AC
  Filled 2016-10-12: qty 5

## 2016-10-12 MED ORDER — ALBUTEROL SULFATE (2.5 MG/3ML) 0.083% IN NEBU
2.5000 mg | INHALATION_SOLUTION | Freq: Once | RESPIRATORY_TRACT | Status: AC
  Administered 2016-10-12: 2.5 mg via RESPIRATORY_TRACT
  Filled 2016-10-12: qty 3

## 2016-10-12 MED ORDER — SODIUM CHLORIDE 0.9 % IV SOLN
10.0000 [IU]/m2 | Freq: Once | INTRAVENOUS | Status: AC
  Administered 2016-10-12: 18 [IU] via INTRAVENOUS
  Filled 2016-10-12: qty 6

## 2016-10-12 MED ORDER — DACARBAZINE 200 MG IV SOLR
375.0000 mg/m2 | Freq: Once | INTRAVENOUS | Status: AC
  Administered 2016-10-12: 660 mg via INTRAVENOUS
  Filled 2016-10-12: qty 33

## 2016-10-12 MED ORDER — VINBLASTINE SULFATE CHEMO INJECTION 1 MG/ML
5.6000 mg/m2 | Freq: Once | INTRAVENOUS | Status: AC
  Administered 2016-10-12: 10 mg via INTRAVENOUS
  Filled 2016-10-12: qty 10

## 2016-10-12 MED ORDER — SODIUM CHLORIDE 0.9% FLUSH
10.0000 mL | INTRAVENOUS | Status: DC | PRN
  Administered 2016-10-12: 10 mL
  Filled 2016-10-12: qty 10

## 2016-10-12 MED ORDER — SODIUM CHLORIDE 0.9 % IV SOLN
Freq: Once | INTRAVENOUS | Status: AC
  Administered 2016-10-12: 12:00:00 via INTRAVENOUS

## 2016-10-12 MED ORDER — PALONOSETRON HCL INJECTION 0.25 MG/5ML
0.2500 mg | Freq: Once | INTRAVENOUS | Status: AC
Start: 2016-10-12 — End: 2016-10-12
  Administered 2016-10-12: 0.25 mg via INTRAVENOUS

## 2016-10-12 MED ORDER — SODIUM CHLORIDE 0.9 % IV SOLN
Freq: Once | INTRAVENOUS | Status: AC
  Administered 2016-10-12: 12:00:00 via INTRAVENOUS
  Filled 2016-10-12: qty 5

## 2016-10-12 MED ORDER — HEPARIN SOD (PORK) LOCK FLUSH 100 UNIT/ML IV SOLN
500.0000 [IU] | Freq: Once | INTRAVENOUS | Status: AC | PRN
  Administered 2016-10-12: 500 [IU]
  Filled 2016-10-12: qty 5

## 2016-10-12 MED ORDER — ONDANSETRON HCL 8 MG PO TABS
8.0000 mg | ORAL_TABLET | Freq: Two times a day (BID) | ORAL | 1 refills | Status: DC | PRN
Start: 2016-10-12 — End: 2017-06-17

## 2016-10-12 MED ORDER — LORAZEPAM 0.5 MG PO TABS: 0.5000 mg | ORAL_TABLET | Freq: Four times a day (QID) | ORAL | 0 refills | Status: DC | PRN

## 2016-10-12 MED FILL — HYDROCODON-APAP 5-325: 5-325 | 3 days supply | Qty: 30 | Fill #0

## 2016-10-12 MED FILL — PROCHLORPERAZINE 10 MG TAB: 10 | 7 days supply | Qty: 30 | Fill #0

## 2016-10-12 MED FILL — DEXAMETHASONE 4 MG TABLET: 4 | 15 days supply | Qty: 30 | Fill #0

## 2016-10-12 MED FILL — ONDANSETRON HCL 8 MG TABLET: 8 | 15 days supply | Qty: 30 | Fill #0

## 2016-10-12 MED FILL — VENTOLIN HFA 90 MCG INHALER: 108 (90 BAS | 25 days supply | Qty: 18 | Fill #0

## 2016-10-12 MED FILL — SENNA PLUS 8.6-50 MG TAB: 8.6-50 | 30 days supply | Qty: 60 | Fill #0

## 2016-10-12 MED FILL — EPINEPHRINE 0.3 MG AUTO-INJ: 0.3 | 30 days supply | Qty: 2 | Fill #0

## 2016-10-12 MED FILL — LORazepam 0.5 MG TABS: 0.5 | 7 days supply | Qty: 30 | Fill #0

## 2016-10-12 NOTE — Patient Instructions (Signed)
[facility] Discharge Instructions for Patients Receiving Chemotherapy  Today you received the following chemotherapy agents Adriamycin, Vinblastine, Bleomycin and DTIC  To help prevent nausea and vomiting after your treatment, we encourage you to take your nausea medication as directed. No Zofran for 3 days. Take Compazine instead.    If you develop nausea and vomiting that is not controlled by your nausea medication, call the clinic.   BELOW ARE SYMPTOMS THAT SHOULD BE REPORTED IMMEDIATELY:  *FEVER GREATER THAN 100.5 F  *CHILLS WITH OR WITHOUT FEVER  NAUSEA AND VOMITING THAT IS NOT CONTROLLED WITH YOUR NAUSEA MEDICATION  *UNUSUAL SHORTNESS OF BREATH  *UNUSUAL BRUISING OR BLEEDING  TENDERNESS IN MOUTH AND THROAT WITH OR WITHOUT PRESENCE OF ULCERS  *URINARY PROBLEMS  *BOWEL PROBLEMS  UNUSUAL RASH Items with * indicate a potential emergency and should be followed up as soon as possible.  Feel free to call the clinic you have any questions or concerns. The clinic phone number is [phone].  Please show the [place] [facility] at check-in to the Emergency Department and triage nurse.  Doxorubicin injection What is this medicine? DOXORUBICIN (dox oh ROO bi sin) is a chemotherapy drug. It is used to treat many kinds of cancer like leukemia, lymphoma, neuroblastoma, sarcoma, and Wilms' tumor. It is also used to treat bladder cancer, breast cancer, lung cancer, ovarian cancer, stomach cancer, and thyroid cancer. This medicine may be used for other purposes; ask your health care provider or pharmacist if you have questions. COMMON BRAND NAME(S): Adriamycin, Adriamycin PFS, Adriamycin RDF, Rubex What should I tell my health care provider before I take this medicine? They need to know if you have any of these conditions: -heart disease -history of low blood counts caused by a medicine -liver disease -recent or ongoing radiation therapy -an unusual or allergic  reaction to doxorubicin, other chemotherapy agents, other medicines, foods, dyes, or preservatives -pregnant or trying to get pregnant -breast-feeding How should I use this medicine? This drug is given as an infusion into a vein. It is administered in a hospital or clinic by a specially trained health care professional. If you have pain, swelling, burning or any unusual feeling around the site of your injection, tell your health care professional right away. Talk to your pediatrician regarding the use of this medicine in children. Special care may be needed. Overdosage: If you think you have taken too much of this medicine contact a poison control center or emergency room at once. NOTE: This medicine is only for you. Do not share this medicine with others. What if I miss a dose? It is important not to miss your dose. Call your doctor or health care professional if you are unable to keep an appointment. What may interact with this medicine? This medicine may interact with the following medications: -6-mercaptopurine -paclitaxel -phenytoin -St. John's Wort -trastuzumab -verapamil This list may not describe all possible interactions. Give your health care provider a list of all the medicines, herbs, non-prescription drugs, or dietary supplements you use. Also tell them if you smoke, drink alcohol, or use illegal drugs. Some items may interact with your medicine. What should I watch for while using this medicine? This drug may make you feel generally unwell. This is not uncommon, as chemotherapy can affect healthy cells as well as cancer cells. Report any side effects. Continue your course of treatment even though you feel ill unless your doctor tells you to stop. There is a maximum amount of this medicine  you should receive throughout your life. The amount depends on the medical condition being treated and your overall health. Your doctor will watch how much of this medicine you receive in your  lifetime. Tell your doctor if you have taken this medicine before. You may need blood work done while you are taking this medicine. Your urine may turn red for a few days after your dose. This is not blood. If your urine is dark or brown, call your doctor. In some cases, you may be given additional medicines to help with side effects. Follow all directions for their use. Call your doctor or health care professional for advice if you get a fever, chills or sore throat, or other symptoms of a cold or flu. Do not treat yourself. This drug decreases your body's ability to fight infections. Try to avoid being around people who are sick. This medicine may increase your risk to bruise or bleed. Call your doctor or health care professional if you notice any unusual bleeding. Talk to your doctor about your risk of cancer. You may be more at risk for certain types of cancers if you take this medicine. Do not become pregnant while taking this medicine or for 6 months after stopping it. Women should inform their doctor if they wish to become pregnant or think they might be pregnant. Men should not father a child while taking this medicine and for 6 months after stopping it. There is a potential for serious side effects to an unborn child. Talk to your health care professional or pharmacist for more information. Do not breast-feed an infant while taking this medicine. This medicine has caused ovarian failure in some women and reduced sperm counts in some men This medicine may interfere with the ability to have a child. Talk with your doctor or health care professional if you are concerned about your fertility. What side effects may I notice from receiving this medicine? Side effects that you should report to your doctor or health care professional as soon as possible: -allergic reactions like skin rash, itching or hives, swelling of the face, lips, or tongue -breathing problems -chest pain -fast or irregular  heartbeat -low blood counts - this medicine may decrease the number of white blood cells, red blood cells and platelets. You may be at increased risk for infections and bleeding. -pain, redness, or irritation at site where injected -signs of infection - fever or chills, cough, sore throat, pain or difficulty passing urine -signs of decreased platelets or bleeding - bruising, pinpoint red spots on the skin, black, tarry stools, blood in the urine -swelling of the ankles, feet, hands -tiredness -weakness Side effects that usually do not require medical attention (report to your doctor or health care professional if they continue or are bothersome): -diarrhea -hair loss -mouth sores -nail discoloration or damage -nausea -red colored urine -vomiting This list may not describe all possible side effects. Call your doctor for medical advice about side effects. You may report side effects to FDA at 1-800-FDA-1088. Where should I keep my medicine? This drug is given in a hospital or clinic and will not be stored at home. NOTE: This sheet is a summary. It may not cover all possible information. If you have questions about this medicine, talk to your doctor, pharmacist, or health care provider.  2018 Elsevier/Gold Standard (2015-07-18 11:28:51)  Vinblastine injection What is this medicine? VINBLASTINE (vin BLAS teen) is a chemotherapy drug. It slows the growth of cancer cells. This medicine is used  to treat many types of cancer like breast cancer, testicular cancer, Hodgkin's disease, non-Hodgkin's lymphoma, and sarcoma. This medicine may be used for other purposes; ask your health care provider or pharmacist if you have questions. COMMON BRAND NAME(S): Velban What should I tell my health care provider before I take this medicine? They need to know if you have any of these conditions: -blood disorders -dental disease -gout -infection (especially a virus infection such as chickenpox, cold sores,  or herpes) -liver disease -lung disease -nervous system disease -recent or ongoing radiation therapy -an unusual or allergic reaction to vinblastine, other chemotherapy agents, other medicines, foods, dyes, or preservatives -pregnant or trying to get pregnant -breast-feeding How should I use this medicine? This drug is given as an infusion into a vein. It is administered in a hospital or clinic by a specially trained health care professional. If you have pain, swelling, burning or any unusual feeling around the site of your injection, tell your health care professional right away. Talk to your pediatrician regarding the use of this medicine in children. While this drug may be prescribed for selected conditions, precautions do apply. Overdosage: If you think you have taken too much of this medicine contact a poison control center or emergency room at once. NOTE: This medicine is only for you. Do not share this medicine with others. What if I miss a dose? It is important not to miss your dose. Call your doctor or health care professional if you are unable to keep an appointment. What may interact with this medicine? Do not take this medicine with any of the following medications: -erythromycin -itraconazole -mibefradil -voriconazole This medicine may also interact with the following medications: -cyclosporine -fluconazole -ketoconazole -medicines for seizures like phenytoin -medicines to increase blood counts like filgrastim, pegfilgrastim, sargramostim -vaccines -verapamil Talk to your doctor or health care professional before taking any of these medicines: -acetaminophen -aspirin -ibuprofen -ketoprofen -naproxen This list may not describe all possible interactions. Give your health care provider a list of all the medicines, herbs, non-prescription drugs, or dietary supplements you use. Also tell them if you smoke, drink alcohol, or use illegal drugs. Some items may interact with  your medicine. What should I watch for while using this medicine? Your condition will be monitored carefully while you are receiving this medicine. You will need important blood work done while you are taking this medicine. This drug may make you feel generally unwell. This is not uncommon, as chemotherapy can affect healthy cells as well as cancer cells. Report any side effects. Continue your course of treatment even though you feel ill unless your doctor tells you to stop. In some cases, you may be given additional medicines to help with side effects. Follow all directions for their use. Call your doctor or health care professional for advice if you get a fever, chills or sore throat, or other symptoms of a cold or flu. Do not treat yourself. This drug decreases your body's ability to fight infections. Try to avoid being around people who are sick. This medicine may increase your risk to bruise or bleed. Call your doctor or health care professional if you notice any unusual bleeding. Be careful brushing and flossing your teeth or using a toothpick because you may get an infection or bleed more easily. If you have any dental work done, tell your dentist you are receiving this medicine. Avoid taking products that contain aspirin, acetaminophen, ibuprofen, naproxen, or ketoprofen unless instructed by your doctor. These medicines may hide  a fever. Do not become pregnant while taking this medicine. Women should inform their doctor if they wish to become pregnant or think they might be pregnant. There is a potential for serious side effects to an unborn child. Talk to your health care professional or pharmacist for more information. Do not breast-feed an infant while taking this medicine. Men may have a lower sperm count while taking this medicine. Talk to your doctor if you plan to father a child. What side effects may I notice from receiving this medicine? Side effects that you should report to your doctor  or health care professional as soon as possible: -allergic reactions like skin rash, itching or hives, swelling of the face, lips, or tongue -low blood counts - This drug may decrease the number of white blood cells, red blood cells and platelets. You may be at increased risk for infections and bleeding. -signs of infection - fever or chills, cough, sore throat, pain or difficulty passing urine -signs of decreased platelets or bleeding - bruising, pinpoint red spots on the skin, black, tarry stools, nosebleeds -signs of decreased red blood cells - unusually weak or tired, fainting spells, lightheadedness -breathing problems -changes in hearing -change in the amount of urine -chest pain -high blood pressure -mouth sores -nausea and vomiting -pain, swelling, redness or irritation at the injection site -pain, tingling, numbness in the hands or feet -problems with balance, dizziness -seizures Side effects that usually do not require medical attention (report to your doctor or health care professional if they continue or are bothersome): -constipation -hair loss -jaw pain -loss of appetite -sensitivity to light -stomach pain -tumor pain This list may not describe all possible side effects. Call your doctor for medical advice about side effects. You may report side effects to FDA at 1-800-FDA-1088. Where should I keep my medicine? This drug is given in a hospital or clinic and will not be stored at home. NOTE: This sheet is a summary. It may not cover all possible information. If you have questions about this medicine, talk to your doctor, pharmacist, or health care provider.  2018 Elsevier/Gold Standard (2008-02-16 17:15:59)  Bleomycin injection What is this medicine? BLEOMYCIN (blee oh MYE sin) is a chemotherapy drug. It is used to treat many kinds of cancer like lymphoma, cervical cancer, head and neck cancer, and testicular cancer. It is also used to prevent and to treat fluid  build-up around the lungs caused by some cancers. This medicine may be used for other purposes; ask your health care provider or pharmacist if you have questions. COMMON BRAND NAME(S): Blenoxane What should I tell my health care provider before I take this medicine? They need to know if you have any of these conditions: -cigarette smoker -kidney disease -lung disease -recent or ongoing radiation therapy -an unusual or allergic reaction to bleomycin, other chemotherapy agents, other medicines, foods, dyes, or preservatives -pregnant or trying to get pregnant -breast-feeding How should I use this medicine? This drug is given as an infusion into a vein or a body cavity. It can also be given as an injection into a muscle or under the skin. It is administered in a hospital or clinic by a specially trained health care professional. Talk to your pediatrician regarding the use of this medicine in children. Special care may be needed. Overdosage: If you think you have taken too much of this medicine contact a poison control center or emergency room at once. NOTE: This medicine is only for you. Do not share  this medicine with others. What if I miss a dose? It is important not to miss your dose. Call your doctor or health care professional if you are unable to keep an appointment. What may interact with this medicine? -certain antibiotics given by injection -cisplatin -cyclosporine -diuretics -foscarnet -medicines to increase blood counts like filgrastim, pegfilgrastim, sargramostim -vaccines This list may not describe all possible interactions. Give your health care provider a list of all the medicines, herbs, non-prescription drugs, or dietary supplements you use. Also tell them if you smoke, drink alcohol, or use illegal drugs. Some items may interact with your medicine. What should I watch for while using this medicine? Visit your doctor for checks on your progress. This drug may make you feel  generally unwell. This is not uncommon, as chemotherapy can affect healthy cells as well as cancer cells. Report any side effects. Continue your course of treatment even though you feel ill unless your doctor tells you to stop. Call your doctor or health care professional for advice if you get a fever, chills or sore throat, or other symptoms of a cold or flu. Do not treat yourself. This drug decreases your body's ability to fight infections. Try to avoid being around people who are sick. Avoid taking products that contain aspirin, acetaminophen, ibuprofen, naproxen, or ketoprofen unless instructed by your doctor. These medicines may hide a fever. Do not become pregnant while taking this medicine. Women should inform their doctor if they wish to become pregnant or think they might be pregnant. There is a potential for serious side effects to an unborn child. Talk to your health care professional or pharmacist for more information. Do not breast-feed an infant while taking this medicine. There is a maximum amount of this medicine you should receive throughout your life. The amount depends on the medical condition being treated and your overall health. Your doctor will watch how much of this medicine you receive in your lifetime. Tell your doctor if you have taken this medicine before. What side effects may I notice from receiving this medicine? Side effects that you should report to your doctor or health care professional as soon as possible: -allergic reactions like skin rash, itching or hives, swelling of the face, lips, or tongue -breathing problems -chest pain -confusion -cough -fast, irregular heartbeat -feeling faint or lightheaded, falls -fever or chills -mouth sores -pain, tingling, numbness in the hands or feet -trouble passing urine or change in the amount of urine -yellowing of the eyes or skin Side effects that usually do not require medical attention (report to your doctor or health  care professional if they continue or are bothersome): -darker skin color -hair loss -irritation at site where injected -loss of appetite -nail changes -nausea and vomiting -weight loss This list may not describe all possible side effects. Call your doctor for medical advice about side effects. You may report side effects to FDA at 1-800-FDA-1088. Where should I keep my medicine? This drug is given in a hospital or clinic and will not be stored at home. NOTE: This sheet is a summary. It may not cover all possible information. If you have questions about this medicine, talk to your doctor, pharmacist, or health care provider.  2018 Else[employer]/Gold Standard (2012-09-16 09:36:48)  Dacarbazine, DTIC injection What is this medicine? DACARBAZINE (da KAR ba zeen) is a chemotherapy drug. This medicine is used to treat skin cancer. It is also used with other medicines to treat Hodgkin's disease. This medicine may be used for other  purposes; ask your health care provider or pharmacist if you have questions. COMMON BRAND NAME(S): DTIC-Dome What should I tell my health care provider before I take this medicine? They need to know if you have any of these conditions: -infection (especially virus infection such as chickenpox, cold sores, or herpes) -kidney disease -liver disease -low blood counts like low platelets, red blood cells, white blood cells -recent radiation therapy -an unusual or allergic reaction to dacarbazine, other chemotherapy agents, other medicines, foods, dyes, or preservatives -pregnant or trying to get pregnant -breast-feeding How should I use this medicine? This drug is given as an injection or infusion into a vein. It is administered in a hospital or clinic by a specially trained health care professional. Talk to your pediatrician regarding the use of this medicine in children. While this drug may be prescribed for selected conditions, precautions do apply. Overdosage: If you  think you have taken too much of this medicine contact a poison control center or emergency room at once. NOTE: This medicine is only for you. Do not share this medicine with others. What if I miss a dose? It is important not to miss your dose. Call your doctor or health care professional if you are unable to keep an appointment. What may interact with this medicine? -medicines to increase blood counts like filgrastim, pegfilgrastim, sargramostim -vaccines This list may not describe all possible interactions. Give your health care provider a list of all the medicines, herbs, non-prescription drugs, or dietary supplements you use. Also tell them if you smoke, drink alcohol, or use illegal drugs. Some items may interact with your medicine. What should I watch for while using this medicine? Your condition will be monitored carefully while you are receiving this medicine. You will need important blood work done while you are taking this medicine. This drug may make you feel generally unwell. This is not uncommon, as chemotherapy can affect healthy cells as well as cancer cells. Report any side effects. Continue your course of treatment even though you feel ill unless your doctor tells you to stop. Call your doctor or health care professional for advice if you get a fever, chills or sore throat, or other symptoms of a cold or flu. Do not treat yourself. This drug decreases your body's ability to fight infections. Try to avoid being around people who are sick. This medicine may increase your risk to bruise or bleed. Call your doctor or health care professional if you notice any unusual bleeding. Talk to your doctor about your risk of cancer. You may be more at risk for certain types of cancers if you take this medicine. Do not become pregnant while taking this medicine. Women should inform their doctor if they wish to become pregnant or think they might be pregnant. There is a potential for serious side  effects to an unborn child. Talk to your health care professional or pharmacist for more information. Do not breast-feed an infant while taking this medicine. What side effects may I notice from receiving this medicine? Side effects that you should report to your doctor or health care professional as soon as possible: -allergic reactions like skin rash, itching or hives, swelling of the face, lips, or tongue -low blood counts - this medicine may decrease the number of white blood cells, red blood cells and platelets. You may be at increased risk for infections and bleeding. -signs of infection - fever or chills, cough, sore throat, pain or difficulty passing urine -signs of decreased platelets  or bleeding - bruising, pinpoint red spots on the skin, black, tarry stools, blood in the urine -signs of decreased red blood cells - unusually weak or tired, fainting spells, lightheadedness -breathing problems -muscle pains -pain at site where injected -trouble passing urine or change in the amount of urine -vomiting -yellowing of the eyes or skin Side effects that usually do not require medical attention (report to your doctor or health care professional if they continue or are bothersome): -diarrhea -hair loss -loss of appetite -nausea -skin more sensitive to sun or ultraviolet light -stomach upset This list may not describe all possible side effects. Call your doctor for medical advice about side effects. You may report side effects to FDA at 1-800-FDA-1088. Where should I keep my medicine? This drug is given in a hospital or clinic and will not be stored at home. NOTE: This sheet is a summary. It may not cover all possible information. If you have questions about this medicine, talk to your doctor, pharmacist, or health care provider.  2018 [employer]/Gold Standard (2015-07-22 15:17:39)

## 2016-10-12 NOTE — Progress Notes (Signed)
After administration of Adrimycin, Vinblastin and Bleomycin, patient reported needing to urinate. After patient returned from restroom, patient reported feeling short of breath stating "I feel like I can't take a deep breath in". Patient also reports a history of asthma and that she did not have her inhaler with her today. MD Gudena notified. 2L O2 applied via nasal cannula. VS taken, elevated BP noted (See flowsheets). MD Lindi Adie at chairside. Per MD Lindi Adie, administer Proventil nebulizer (See eMAR). After nebulizer treatment, patient reports "feeling much better", VSS. Per MD Lindi Adie, ok to start DTIC.

## 2016-10-12 NOTE — Progress Notes (Signed)
Met with patient and mom with financial questions or concerns regarding prescriptions. Patient's mom wrote a letter of support stating she provides a place to live for her daughter. Her daughter has no income and is a Ship broker. Approved patient for one-time $400 [place]. Called [facility]) to have rx's pulled from [facility] to them for her to be able to use the grant to cover. Patient has a copy of the approval letter as well as the expense sheet and Outpatient pharmacy information. Patient has my contact information for any additional financial questions or concerns.

## 2016-10-12 NOTE — Telephone Encounter (Signed)
Scheduled appt per 5/11 LOS and treatment plan . Patient did not stop by scheduling - patient to get new schedule in the treatment area.

## 2016-10-13 LAB — SEDIMENTATION RATE: Sedimentation Rate-Westergren: 71 mm/hr — ABNORMAL HIGH (ref 0–32)

## 2016-10-13 NOTE — Progress Notes (Signed)
[name]    HEMATOLOGY/ONCOLOGY CLINIC NOTE  Date of Service: .10/12/2016  Patient Care Team: Brunetta Genera, MD as PCP - General (Hematology) Patient, No Pcp Per (General Practice)  CHIEF COMPLAINTS/PURPOSE OF CONSULTATION:  Newly diagnosed classical Hodgkin's lymphoma  HISTORY OF PRESENTING ILLNESS:   Leah Floyd is a wonderful 26 y.o. female who has been referred to [place] by [employer] [place] for evaluation and management of newly diagnosed classical Hodgkin's lymphoma.  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  She notes that in November 2017 she developed generalized body aches and flulike symptoms with some subjective fevers chills and night sweats. A primary care physician noted a right axillary lump and she was treated empirically with Z-Pak for possible infection. She notes that around Christmas time in 2017 she developed worsening fatigue and generalized body aches a point that she was having difficulties getting through the day. She notes that she had an autoimmune workup HIV testing which was unrevealing she was noted to have some iron deficiency. She notes that she had a follow-up ultrasound that showed right axillary lymph node was enlarging and she also had a left axillary lymph node and had noted inguinal lymph nodes in the groin. Some of her workup was delayed due to insurance issues that she eventually had a left axillary lymph node biopsy on 09/24/2016 accession number [id number] which showed classical Hodgkin's lymphoma.  Patient then has moved back from [place] to be with her mother and family in [place] not, and is here to seek further cares for her classical Hodgkin's lymphoma newly diagnosed.  Patient notes significant fatigue, night sweats, weight loss of about 40  pounds in the last 4-5 months, generalized body aches, dry cough and loss of appetite.  She notes that she previously had heavy menstrual periods lasting 7 days which are now become lighter and last about 4 days. She has been taking ferrous sulfate plus vitamin C for her iron deficiency anemia.  No previous heart problems. Has been having some mild diarrhea which she describes as soft stools 2-3 times a day.  INTERVAL HISTORY  Leah Floyd is here with her mother prior to starting her 1st cycle of ABVD. She has thought about it but refuses lupron for fertility preservation. Notes she had a heavier period and has been feeling more fatigued.She notes that she cannot tolerate po iron and prefers po iron replacement. Chemo-counseling completed PET/CT scan results were discussed in details with the patient. All of her questions we answered in details and she is ready to proceed with treatment.  MEDICAL HISTORY:   #1 iron deficiency anemia #2 GERD #3 obesity. #4 pseudotumor cerebri however this has resolved with her weight loss as per patient. #5 asthma #6 of newly diagnosed Hodgkin's lymphoma #7 shellfish allergy #8 history of Chlamydia infection   SURGICAL HISTORY: Past Surgical History:  Procedure Laterality Date  . ANTERIOR CRUCIATE LIGAMENT REPAIR     left  . HERNIA REPAIR     left  . IR FLUORO GUIDE PORT INSERTION RIGHT  10/04/2016  . IR US GUIDE VASC ACCESS RIGHT  10/04/2016    SOCIAL HISTORY: Social History   Social History  . Marital status: Single    Spouse name: N/A  . Number of children: N/A  . Years of education: N/A   Occupational History  .  Not on file.   Social History Main Topics  . Smoking status: Never Smoker  . Smokeless tobacco: Never Used  . Alcohol use Yes     Comment: socially  . Drug use: Yes    Types: Marijuana     Comment: medical   . Sexual activity: Not on file   Other Topics Concern  . Not on file   Social History Narrative  . No  narrative on file    FAMILY HISTORY: Notes her first cousin on her mom's side had some form off rare bone marrow cancer Does not know history on her father's side of the family Maternal grandmother-hypertension, diabetes, fibromyalgia Mother anemia Other relatives on her mom's side with lymphoma and bladder cancer.  ALLERGIES:  is allergic to shellfish-derived products.  MEDICATIONS:  Current Outpatient Prescriptions  Medication Sig Dispense Refill  . Acetaminophen-Pamabrom (WOMENS TYLENOL PO) Take by mouth.    [name] albuterol (PROVENTIL HFA;VENTOLIN HFA) 108 (90 Base) MCG/ACT inhaler Inhale 2 puffs into the lungs every 6 (six) hours as needed for wheezing or shortness of breath. 1 Inhaler 2  . Ascorbic Acid (VITAMIN C PO) Take by mouth.    . Cholecalciferol (VITAMIN D PO) Take by mouth.    . dexamethasone (DECADRON) 4 MG tablet Take 2 tablets by mouth once a day on the day after chemotherapy and then take 2 tablets two times a day for 2 days. Take with food. 30 tablet 1  . lidocaine-prilocaine (EMLA) cream Apply to affected area once 30 g 3  . LORazepam (ATIVAN) 0.5 MG tablet Take 1 tablet (0.5 mg total) by mouth every 6 (six) hours as needed (Nausea or vomiting). 30 tablet 0  . ondansetron (ZOFRAN) 8 MG tablet Take 1 tablet (8 mg total) by mouth 2 (two) times daily as needed. Start on the third day after chemotherapy. 30 tablet 1  . prochlorperazine (COMPAZINE) 10 MG tablet Take 1 tablet (10 mg total) by mouth every 6 (six) hours as needed (Nausea or vomiting). 30 tablet 1  . EPINEPHrine 0.3 mg/0.3 mL IJ SOAJ injection Inject 0.3 mLs (0.3 mg total) into the muscle once. For anaphylactic reactions 1 Device 0  . HYDROcodone-acetaminophen (NORCO/VICODIN) 5-325 MG tablet Take 1-2 tablets by mouth every 6 (six) hours as needed for moderate pain or severe pain. 30 tablet 0  . senna-docusate (SENNA S) 8.6-50 MG tablet Take 2 tablets by mouth at bedtime. Hold if diarrhea ensues 60 tablet 1   No  current facility-administered medications for this visit.     REVIEW OF SYSTEMS:    10 Point review of Systems was done is negative except as noted above.  PHYSICAL EXAMINATION: ECOG PERFORMANCE STATUS: 1 - Symptomatic but completely ambulatory  . Vitals:   10/12/16 0947  BP: (!) 134/92  Pulse: 66  Resp: 19  Temp: 98.5 F (36.9 C)   Filed Weights   10/12/16 0947  Weight: 162 lb 3.2 oz (73.6 kg)   .Body mass index is 30.65 kg/m.  GENERAL:alert, in no acute distress and comfortable SKIN: no acute rashes, no significant lesions EYES: conjunctiva are pink and non-injected, sclera anicteric OROPHARYNX: MMM, no exudates, no oropharyngeal erythema or ulceration NECK: supple, no JVD LYMPH:  Patient has palpable lymphadenopathy bilaterally in her neck , occipital area, supraclavicular, bilateral axillary and inguinal areas  LUNGS: clear to auscultation b/l with normal respiratory effort HEART: regular rate & rhythm ABDOMEN:  normoactive bowel sounds , non tender, not distended. Borderline palpable splenomegaly, no palpable hepatomegaly.  Extremity: no pedal edema PSYCH: alert & oriented x 3 with fluent speech NEURO: no focal motor/sensory deficits  LABORATORY DATA:  I have reviewed the data as listed  . CBC Latest Ref Rng & Units 10/12/2016 10/04/2016 10/02/2016  WBC 3.9 - 10.3 10e3/uL 4.7 5.9 4.4  Hemoglobin 11.6 - 15.9 g/dL 10.2(L) 10.8(L) 10.8(L)  Hematocrit 34.8 - 46.6 % 32.1(L) 33.4(L) 33.3(L)  Platelets 145 - 400 10e3/uL 247 334 302   . CBC    Component Value Date/Time   WBC 4.7 10/12/2016 0933   WBC 5.9 10/04/2016 0747   RBC 3.96 10/12/2016 0933   RBC 4.23 10/04/2016 0747   HGB 10.2 (L) 10/12/2016 0933   HCT 32.1 (L) 10/12/2016 0933   PLT 247 10/12/2016 0933   MCV 81.1 10/12/2016 0933   MCH 25.8 10/12/2016 0933   MCH 25.5 (L) 10/04/2016 0747   MCHC 31.8 10/12/2016 0933   MCHC 32.3 10/04/2016 0747   RDW 18.0 (H) 10/12/2016 0933   LYMPHSABS 0.5 (L) 10/12/2016  0933   MONOABS 0.4 10/12/2016 0933   EOSABS 1.1 (H) 10/12/2016 0933   BASOSABS 0.0 10/12/2016 0933    . CMP Latest Ref Rng & Units 10/12/2016 10/02/2016  Glucose 70 - 140 mg/dl 89 80  BUN 7.0 - 26.0 mg/dL 5.7(L) 6.2(L)  Creatinine 0.6 - 1.1 mg/dL 0.7 0.7  Sodium 136 - 145 mEq/L 136 135(L)  Potassium 3.5 - 5.1 mEq/L 4.3 3.9  CO2 22 - 29 mEq/L 27 26  Calcium 8.4 - 10.4 mg/dL 9.2 9.3  Total Protein 6.4 - 8.3 g/dL 9.0(H) 9.5(H)  Total Bilirubin 0.20 - 1.20 mg/dL <0.22 <0.22  Alkaline Phos 40 - 150 U/L 81 72  AST 5 - 34 U/L 13 13  ALT 0 - 55 U/L 8 8   . Lab Results  Component Value Date   LDH 194 10/02/2016   . Lab Results  Component Value Date   IRON 26 (L) 10/02/2016   TIBC 349 10/02/2016   IRONPCTSAT 7 (L) 10/02/2016   (Iron and TIBC)  Lab Results  Component Value Date   FERRITIN 10 10/02/2016      RADIOGRAPHIC STUDIES: I have personally reviewed the radiological images as listed and agreed with the findings in the report. Nm Pet Image Initial (pi) Skull Base To Thigh  Result Date: 10/10/2016 CLINICAL DATA:  Initial treatment strategy for Hodgkin's lymphoma. EXAM: NUCLEAR MEDICINE PET SKULL BASE TO THIGH TECHNIQUE: 8.8 mCi F-18 FDG was injected intravenously. Full-ring PET imaging was performed from the skull base to thigh after the radiotracer. CT data was obtained and used for attenuation correction and anatomic localization. FASTING BLOOD GLUCOSE:  Value: 91 mg/dl COMPARISON:  None. FINDINGS: NECK Left-sided deep parotid lymph node about 0.8 cm in short axis on image 19/4, maximum SUV 7.6. Left station IIa lymph nodes are small, measuring up to 7 mm in short axis on image 23/4, with maximum SUV 4.5. Somewhat similar small right station 2A lymph nodes. Accentuated metabolic activity in the supraclavicular region is thought to be due to a combination of lymph nodes but also hypermetabolic brown fat. An index left supraclavicular lymph node with short axis diameter of 1.1 cm  on image 40/4 has a maximum standard uptake value of 6.3. CHEST Enlarged and hypermetabolic bilateral axillary, bilateral subpectoral, and prevascular adenopathy is noted. An index left lateral axillary lymph node on image 51/4 measures 2.8 cm in short axis and has a maximum standard uptake value of 8.0. Port-A-Cath tip: Right atrium.  For reference purposes, background mediastinal blood pool activity is 2.7. ABDOMEN/PELVIS Hypermetabolic and enlarged retrocrural, periaortic, iliac chain, and inguinal adenopathy. An index left eccentric periaortic lymph node measures 1.7 cm on image 112/4 and has a maximum standard uptake value of 7.6. A left common iliac node measuring 2.2 cm on image 139/4 has a maximum standard uptake value of 19.0. An index left external iliac node measuring 2.2 cm in short axis on image 105 T5 of series 4 has a maximum standard uptake value of 10.1. Left inguinal lymph node measuring 1.5 cm in short axis on image 161/4 has a maximum standard uptake value of 7.2. For reference purposes, background liver activity SUV is 3.1. SKELETON Variable degrees of hypermetabolic activity within the bony structures including the left mandible, right proximal humerus, spine, bony pelvis, and proximal femurs. An index lesion in the left iliac bone above the acetabulum has a maximum SUV of 11.4, this is one of the more hypermetabolic foci although many of the lesions have less striking but still abnormal activity. There appears to be a small hypermetabolic right brachial lymph node in the right upper extremity near the proximal humeral metaphysis which is mildly hypermetabolic, this measures 8 mm in short axis diameter on image 31/4 and has maximum SUV of 5.1. IMPRESSION: 1. Extensive hypermetabolic adenopathy in the chest, abdomen, and pelvis compatible with active lymphoma, maximum standard uptake value measurements up to 19.0 (Deauville 5). 2. Scattered hypermetabolic activity in the axial and appendicular  skeleton, much of which is Deauville 5. 3. Level IIa lymph nodes in the neck are small and Deauville 4. Electronically Signed   By: Van Clines M.D.   On: 10/10/2016 15:29   Ir US Guide Vasc Access Right  Result Date: 10/04/2016 INDICATION: History of lymphoma. In need of durable intravenous access for chemotherapy administration. EXAM: IMPLANTED PORT A CATH PLACEMENT WITH ULTRASOUND AND FLUOROSCOPIC GUIDANCE COMPARISON:  None. MEDICATIONS: Ancef 2 gm IV; The antibiotic was administered within an appropriate time interval prior to skin puncture. ANESTHESIA/SEDATION: Moderate (conscious) sedation was employed during this procedure. A total of Versed 3 mg and Fentanyl 100 mcg was administered intravenously. Moderate Sedation Time: 27 minutes. The patient's level of consciousness and vital signs were monitored continuously by radiology nursing throughout the procedure under my direct supervision. CONTRAST:  None FLUOROSCOPY TIME:  36 seconds (7.8 MGy) COMPLICATIONS: None immediate. PROCEDURE: The procedure, risks, benefits, and alternatives were explained to the patient. Questions regarding the procedure were encouraged and answered. The patient understands and consents to the procedure. The right neck and chest were prepped with chlorhexidine in a sterile fashion, and a sterile drape was applied covering the operative field. Maximum barrier sterile technique with sterile gowns and gloves were used for the procedure. A timeout was performed prior to the initiation of the procedure. Local anesthesia was provided with 1% lidocaine with epinephrine. After creating a small venotomy incision, a micropuncture kit was utilized to access the internal jugular vein under direct, real-time ultrasound guidance. Ultrasound image documentation was performed. The microwire was kinked to measure appropriate catheter length. A subcutaneous port pocket was then created along the upper chest wall utilizing a combination of  sharp and blunt dissection. The pocket was irrigated with sterile saline. A single lumen ISP power injectable port was chosen for placement. The 8 Fr catheter was tunneled from the port pocket site to the venotomy incision. The port was placed in the pocket. The external catheter was trimmed to appropriate length. At the  venotomy, an 8 Fr peel-away sheath was placed over a guidewire under fluoroscopic guidance. The catheter was then placed through the sheath and the sheath was removed. Final catheter positioning was confirmed and documented with a fluoroscopic spot radiograph. The port was accessed with a Huber needle, aspirated and flushed with heparinized saline. The venotomy site was closed with an interrupted 4-0 Vicryl suture. The port pocket incision was closed with interrupted 2-0 Vicryl suture and the skin was opposed with a running subcuticular 4-0 Vicryl suture. Dermabond and Steri-strips were applied to both incisions. Dressings were placed. The patient tolerated the procedure well without immediate post procedural complication. FINDINGS: After catheter placement, the tip lies within the superior aspect of the right atrium. The catheter aspirates and flushes normally and is ready for immediate use. IMPRESSION: Successful placement of a right internal jugular approach power injectable Port-A-Cath. The catheter is ready for immediate use. Electronically Signed   By: Sandi Mariscal M.D.   On: 10/04/2016 10:27   Ir Fluoro Guide Port Insertion Right  Result Date: 10/04/2016 INDICATION: History of lymphoma. In need of durable intravenous access for chemotherapy administration. EXAM: IMPLANTED PORT A CATH PLACEMENT WITH ULTRASOUND AND FLUOROSCOPIC GUIDANCE COMPARISON:  None. MEDICATIONS: Ancef 2 gm IV; The antibiotic was administered within an appropriate time interval prior to skin puncture. ANESTHESIA/SEDATION: Moderate (conscious) sedation was employed during this procedure. A total of Versed 3 mg and  Fentanyl 100 mcg was administered intravenously. Moderate Sedation Time: 27 minutes. The patient's level of consciousness and vital signs were monitored continuously by radiology nursing throughout the procedure under my direct supervision. CONTRAST:  None FLUOROSCOPY TIME:  36 seconds (7.8 MGy) COMPLICATIONS: None immediate. PROCEDURE: The procedure, risks, benefits, and alternatives were explained to the patient. Questions regarding the procedure were encouraged and answered. The patient understands and consents to the procedure. The right neck and chest were prepped with chlorhexidine in a sterile fashion, and a sterile drape was applied covering the operative field. Maximum barrier sterile technique with sterile gowns and gloves were used for the procedure. A timeout was performed prior to the initiation of the procedure. Local anesthesia was provided with 1% lidocaine with epinephrine. After creating a small venotomy incision, a micropuncture kit was utilized to access the internal jugular vein under direct, real-time ultrasound guidance. Ultrasound image documentation was performed. The microwire was kinked to measure appropriate catheter length. A subcutaneous port pocket was then created along the upper chest wall utilizing a combination of sharp and blunt dissection. The pocket was irrigated with sterile saline. A single lumen ISP power injectable port was chosen for placement. The 8 Fr catheter was tunneled from the port pocket site to the venotomy incision. The port was placed in the pocket. The external catheter was trimmed to appropriate length. At the venotomy, an 8 Fr peel-away sheath was placed over a guidewire under fluoroscopic guidance. The catheter was then placed through the sheath and the sheath was removed. Final catheter positioning was confirmed and documented with a fluoroscopic spot radiograph. The port was accessed with a Huber needle, aspirated and flushed with heparinized saline. The  venotomy site was closed with an interrupted 4-0 Vicryl suture. The port pocket incision was closed with interrupted 2-0 Vicryl suture and the skin was opposed with a running subcuticular 4-0 Vicryl suture. Dermabond and Steri-strips were applied to both incisions. Dressings were placed. The patient tolerated the procedure well without immediate post procedural complication. FINDINGS: After catheter placement, the tip lies within the superior aspect  of the right atrium. The catheter aspirates and flushes normally and is ready for immediate use. IMPRESSION: Successful placement of a right internal jugular approach power injectable Port-A-Cath. The catheter is ready for immediate use. Electronically Signed   By: Sandi Mariscal M.D.   On: 10/04/2016 10:27    ASSESSMENT & PLAN:   26 year old female with   #1 Classical Hodgkin's lymphoma Stage IVB as per PET/CT Patient has constitutional symptoms with significant weight loss of 40 pounds, and night sweats and debilitating fatigue. PLAN -PET/CT results discussed in details with the patient and her mom ECHO shows nl ef -PFT's nl DLCO -patient will start ABVD from today. Chemo-counseling completed.. -RTC in 1 weeks with labs for toxicity check.   #2 fertility preservation -patient declined all fertility preservation options including lupron.  #3 history of iron deficiency anemia Has been on oral iron that causes significant constipation and is still significantly anemic. Plan -IV Injectafer 740m po x 1 dose in 1 week  #4 history of asthma. This has been mild [place] Plan -she does not have a primary care physician at this time. 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 Patient notes that she tolerates eating shrimp. -Prescribed EpiPen for when necessary use. -Recommended also to carry Benadryl.  #6 general medical cares -Patient was  recommended to establish a primary care physician as soon as possible.  Continue ABVD as per schedule starting today IV Injectafer 7553mx 1 dose in 1 week for Iron deficiency RTC with Dr KaIrene Limbon 1 week with labs for a toxicity check  I spent 30 minutes counseling the patient face to face. The total time spent in the appointment was 40 minutes and more than 50% was on counseling and direct patient cares.    GaSullivan LoneD MSAllianceAHIVMS SCRegional One HealthTPinellas Surgery Center Ltd Dba Center For Special Surgeryematology/Oncology Physician CoEastern Orange [facility](Office):       [phone] cell):  [phone]):           [phone]

## 2016-10-15 ENCOUNTER — Telehealth: Payer: Self-pay

## 2016-10-15 NOTE — Telephone Encounter (Signed)
Pt had adria, vinblastin, bleo on 5/11. She started throat tenderness this AM. She denies fever. She is eating and drinking well. Nausea is controlled.  She cannot see the area of her tongue/throat that is sore to visualize color. This RN discussed biotene or baking soda mouth rinses and eating cultured buttermilk or yogurt. She is asking if she needs diflucan or other medication for her sore throat. She is requesting we use [facility].

## 2016-10-16 ENCOUNTER — Other Ambulatory Visit: Payer: Self-pay | Admitting: *Deleted

## 2016-10-16 MED ORDER — MAGIC MOUTHWASH W/LIDOCAINE: 5.0000 mL | Freq: Four times a day (QID) | ORAL | 0 refills | Status: DC | PRN

## 2016-10-16 MED FILL — CMPD-MMW:MYLANTA,LIDO,NYST: 6 days supply | Qty: 120 | Fill #0

## 2016-10-18 ENCOUNTER — Other Ambulatory Visit: Payer: Self-pay | Admitting: *Deleted

## 2016-10-18 DIAGNOSIS — C8190 Hodgkin lymphoma, unspecified, unspecified site: Secondary | ICD-10-CM

## 2016-10-19 ENCOUNTER — Ambulatory Visit (HOSPITAL_BASED_OUTPATIENT_CLINIC_OR_DEPARTMENT_OTHER): Payer: Self-pay | Admitting: Hematology

## 2016-10-19 ENCOUNTER — Encounter: Payer: Self-pay | Admitting: Hematology

## 2016-10-19 ENCOUNTER — Ambulatory Visit (HOSPITAL_BASED_OUTPATIENT_CLINIC_OR_DEPARTMENT_OTHER): Payer: Self-pay

## 2016-10-19 ENCOUNTER — Telehealth: Payer: Self-pay | Admitting: Hematology

## 2016-10-19 ENCOUNTER — Other Ambulatory Visit (HOSPITAL_BASED_OUTPATIENT_CLINIC_OR_DEPARTMENT_OTHER): Payer: Self-pay

## 2016-10-19 VITALS — BP 146/97 | HR 81 | Temp 98.0°F | Resp 18 | Wt 158.8 lb

## 2016-10-19 VITALS — BP 125/92 | HR 79 | Temp 98.5°F | Resp 18

## 2016-10-19 DIAGNOSIS — D509 Iron deficiency anemia, unspecified: Secondary | ICD-10-CM

## 2016-10-19 DIAGNOSIS — D500 Iron deficiency anemia secondary to blood loss (chronic): Secondary | ICD-10-CM

## 2016-10-19 DIAGNOSIS — C8194 Hodgkin lymphoma, unspecified, lymph nodes of axilla and upper limb: Secondary | ICD-10-CM

## 2016-10-19 DIAGNOSIS — C8198 Hodgkin lymphoma, unspecified, lymph nodes of multiple sites: Secondary | ICD-10-CM

## 2016-10-19 DIAGNOSIS — D649 Anemia, unspecified: Secondary | ICD-10-CM

## 2016-10-19 DIAGNOSIS — C8190 Hodgkin lymphoma, unspecified, unspecified site: Secondary | ICD-10-CM

## 2016-10-19 LAB — COMPREHENSIVE METABOLIC PANEL
ALBUMIN: 3.3 g/dL — AB (ref 3.5–5.0)
ALK PHOS: 65 U/L (ref 40–150)
ALT: 15 U/L (ref 0–55)
AST: 13 U/L (ref 5–34)
Anion Gap: 10 mEq/L (ref 3–11)
BILIRUBIN TOTAL: 0.36 mg/dL (ref 0.20–1.20)
BUN: 6.2 mg/dL — AB (ref 7.0–26.0)
CO2: 26 mEq/L (ref 22–29)
Calcium: 9.8 mg/dL (ref 8.4–10.4)
Chloride: 100 mEq/L (ref 98–109)
Creatinine: 0.7 mg/dL (ref 0.6–1.1)
EGFR: >90 mL/min/{1.73_m2} (ref >90)
GLUCOSE: 98 mg/dL (ref 70–140)
POTASSIUM: 4 meq/L (ref 3.5–5.1)
SODIUM: 135 meq/L — AB (ref 136–145)
TOTAL PROTEIN: 8.9 g/dL — AB (ref 6.4–8.3)

## 2016-10-19 LAB — CBC & DIFF AND RETIC
BASO%: 0.4 % (ref 0.0–2.0)
BASOS ABS: 0 10*3/uL (ref 0.0–0.1)
EOS%: 12.9 % — AB (ref 0.0–7.0)
Eosinophils Absolute: 0.3 10*3/uL (ref 0.0–0.5)
HEMATOCRIT: 33.7 % — AB (ref 34.8–46.6)
HGB: 10.8 g/dL — ABNORMAL LOW (ref 11.6–15.9)
Immature Retic Fract: 0 % — ABNORMAL LOW (ref 1.60–10.00)
LYMPH%: 17.3 % (ref 14.0–49.7)
MCH: 25.8 pg (ref 25.1–34.0)
MCHC: 32 g/dL (ref 31.5–36.0)
MCV: 80.4 fL (ref 79.5–101.0)
MONO#: 0 10*3/uL — ABNORMAL LOW (ref 0.1–0.9)
MONO%: 1.2 % (ref 0.0–14.0)
NEUT#: 1.7 10*3/uL (ref 1.5–6.5)
NEUT%: 68.2 % (ref 38.4–76.8)
Platelets: 256 10*3/uL (ref 145–400)
RBC: 4.19 10*6/uL (ref 3.70–5.45)
RDW: 16.3 % — AB (ref 11.2–14.5)
RETIC %: 0.38 % — AB (ref 0.70–2.10)
Retic Ct Abs: 15.92 10*3/uL — ABNORMAL LOW (ref 33.70–90.70)
WBC: 2.5 10*3/uL — AB (ref 3.9–10.3)
lymph#: 0.4 10*3/uL — ABNORMAL LOW (ref 0.9–3.3)

## 2016-10-19 MED ORDER — SODIUM CHLORIDE 0.9% FLUSH
10.0000 mL | INTRAVENOUS | Status: DC | PRN
  Administered 2016-10-19: 10 mL via INTRAVENOUS
  Filled 2016-10-19: qty 10

## 2016-10-19 MED ORDER — HEPARIN SOD (PORK) LOCK FLUSH 100 UNIT/ML IV SOLN
500.0000 [IU] | Freq: Once | INTRAVENOUS | Status: AC
  Administered 2016-10-19: 500 [IU] via INTRAVENOUS
  Filled 2016-10-19: qty 5

## 2016-10-19 MED ORDER — SODIUM CHLORIDE 0.9 % IV SOLN
750.0000 mg | Freq: Once | INTRAVENOUS | Status: AC
  Administered 2016-10-19: 750 mg via INTRAVENOUS
  Filled 2016-10-19: qty 15

## 2016-10-19 NOTE — Patient Instructions (Signed)
Thank you for choosing [facility] to provide your oncology and hematology care.  To afford each patient quality time with our providers, please arrive 30 minutes before your scheduled appointment time.  If you arrive late for your appointment, you may be asked to reschedule.  We strive to give you quality time with our providers, and arriving late affects you and other patients whose appointments are after yours.   If you are a no show for multiple scheduled visits, you may be dismissed from the clinic at the providers discretion.    Again, thank you for choosing [facility], our hope is that these requests will decrease the amount of time that you wait before being seen by our physicians.  ______________________________________________________________________  Should you have questions after your visit to the [facility], please contact our office at [phone] between the hours of 8:30 and 4:30 p.m.    Voicemails left after 4:30p.m will not be returned until the following business day.    For prescription refill requests, please have your pharmacy contact us directly.  Please also try to allow 48 hours for prescription requests.    Please contact the scheduling department for questions regarding scheduling.  For scheduling of procedures such as PET scans, CT scans, MRI, Ultrasound, etc please contact central scheduling at [phone].    Resources For Cancer Patients and Caregivers:   Oncolink.org:  A wonderful resource for patients and healthcare providers for information regarding your disease, ways to tract your treatment, what to expect, etc.     American Cancer Society:  800-227-2345  Can help patients locate various types of support and financial assistance  Cancer Care: 1-800-813-HOPE (4673) Provides financial assistance, online support groups, medication/co-pay assistance.    [place] DSS:  336-641-3447 Where to apply for food  stamps, Medicaid, and utility assistance  Medicare Rights Center: 800-333-4114 Helps people with Medicare understand their rights and benefits, navigate the Medicare system, and secure the quality healthcare they deserve  SCAT: 336-333-6589 [place] [employer]'s shared-ride transportation service for eligible riders who have a disability that prevents them from riding the fixed route bus.    For additional information on assistance programs please contact our social worker:   Grier Hock/Abigail Elmore:  [phone]            

## 2016-10-19 NOTE — Patient Instructions (Signed)
Ferric carboxymaltose injection What is this medicine? FERRIC CARBOXYMALTOSE (ferr-ik car-box-ee-mol-toes) is an iron complex. Iron is used to make healthy red blood cells, which carry oxygen and nutrients throughout the body. This medicine is used to treat anemia in people with chronic kidney disease or people who cannot take iron by mouth. This medicine may be used for other purposes; ask your health care provider or pharmacist if you have questions. COMMON BRAND NAME(S): Injectafer What should I tell my health care provider before I take this medicine? They need to know if you have any of these conditions: -anemia not caused by low iron levels -high levels of iron in the blood -liver disease -an unusual or allergic reaction to iron, other medicines, foods, dyes, or preservatives -pregnant or trying to get pregnant -breast-feeding How should I use this medicine? This medicine is for infusion into a vein. It is given by a health care professional in a hospital or clinic setting. Talk to your pediatrician regarding the use of this medicine in children. Special care may be needed. Overdosage: If you think you have taken too much of this medicine contact a poison control center or emergency room at once. NOTE: This medicine is only for you. Do not share this medicine with others. What if I miss a dose? It is important not to miss your dose. Call your doctor or health care professional if you are unable to keep an appointment. What may interact with this medicine? Do not take this medicine with any of the following medications: -deferoxamine -dimercaprol -other iron products This medicine may also interact with the following medications: -chloramphenicol -deferasirox This list may not describe all possible interactions. Give your health care provider a list of all the medicines, herbs, non-prescription drugs, or dietary supplements you use. Also tell them if you smoke, drink alcohol, or use  illegal drugs. Some items may interact with your medicine. What should I watch for while using this medicine? Visit your doctor or health care professional regularly. Tell your doctor if your symptoms do not start to get better or if they get worse. You may need blood work done while you are taking this medicine. You may need to follow a special diet. Talk to your doctor. Foods that contain iron include: whole grains/cereals, dried fruits, beans, or peas, leafy green vegetables, and organ meats (liver, kidney). What side effects may I notice from receiving this medicine? Side effects that you should report to your doctor or health care professional as soon as possible: -allergic reactions like skin rash, itching or hives, swelling of the face, lips, or tongue -breathing problems -changes in blood pressure -feeling faint or lightheaded, falls -flushing, sweating, or hot feelings Side effects that usually do not require medical attention (report to your doctor or health care professional if they continue or are bothersome): -changes in taste -constipation -dizziness -headache -nausea -pain, redness, or irritation at site where injected -vomiting This list may not describe all possible side effects. Call your doctor for medical advice about side effects. You may report side effects to FDA at 1-800-FDA-1088. Where should I keep my medicine? This drug is given in a hospital or clinic and will not be stored at home. NOTE: This sheet is a summary. It may not cover all possible information. If you have questions about this medicine, talk to your doctor, pharmacist, or health care provider.  2018 [employer] (2015-06-23 11:20:47)  

## 2016-10-19 NOTE — Telephone Encounter (Signed)
Appointments complete per 5/18 los. Lab/fu added for 6/7 due to [place] out of office 6/8 - tx only 6/8.

## 2016-10-19 NOTE — Progress Notes (Signed)
[name]    HEMATOLOGY/ONCOLOGY CLINIC NOTE  Date of Service: .10/19/2016  Patient Care Team: Brunetta Genera, MD as PCP - General (Hematology) Patient, No Pcp Per (General Practice)  CHIEF COMPLAINTS/PURPOSE OF CONSULTATION:  Newly diagnosed classical Hodgkin's lymphoma  HISTORY OF PRESENTING ILLNESS:   Leah Floyd is a wonderful 26 y.o. female who has been referred to [place] by [employer] [place] for evaluation and management of newly diagnosed classical Hodgkin's lymphoma.  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  She notes that in November 2017 she developed generalized body aches and flulike symptoms with some subjective fevers chills and night sweats. A primary care physician noted a right axillary lump and she was treated empirically with Z-Pak for possible infection. She notes that around Christmas time in 2017 she developed worsening fatigue and generalized body aches a point that she was having difficulties getting through the day. She notes that she had an autoimmune workup HIV testing which was unrevealing she was noted to have some iron deficiency. She notes that she had a follow-up ultrasound that showed right axillary lymph node was enlarging and she also had a left axillary lymph node and had noted inguinal lymph nodes in the groin. Some of her workup was delayed due to insurance issues that she eventually had a left axillary lymph node biopsy on 09/24/2016 accession number [id number] which showed classical Hodgkin's lymphoma.  Patient then has moved back from [place] to be with her mother and family in [place] not, and is here to seek further cares for her classical Hodgkin's lymphoma newly diagnosed.  Patient notes significant fatigue, night sweats, weight loss of about 40  pounds in the last 4-5 months, generalized body aches, dry cough and loss of appetite.  She notes that she previously had heavy menstrual periods lasting 7 days which are now become lighter and last about 4 days. She has been taking ferrous sulfate plus vitamin C for her iron deficiency anemia.  No previous heart problems. Has been having some mild diarrhea which she describes as soft stools 2-3 times a day.  INTERVAL HISTORY  Leah Floyd is here with her her grandmother for a one-week follow-up after first cycle of ABVD for toxicity check. She notes that she had some mild wheezing after the first cycle of treatment which responded to nebulization. We have scheduled her albuterol treatment treatment from our next cycle. She has picked up her albuterol inhaler an EpiPen. No significant nausea vomiting diarrhea mouth soreness fevers or chills chest pain or shortness of breath. Overall feels well and notes that she is eating better.  MEDICAL HISTORY:   #1 iron deficiency anemia #2 GERD #3 obesity. #4 pseudotumor cerebri however this has resolved with her weight loss as per patient. #5 asthma #6 of newly diagnosed Hodgkin's lymphoma #7 shellfish allergy #8 history of Chlamydia infection   SURGICAL HISTORY: Past Surgical History:  Procedure Laterality Date  . ANTERIOR CRUCIATE LIGAMENT REPAIR     left  . HERNIA REPAIR     left  . IR FLUORO GUIDE PORT INSERTION RIGHT  10/04/2016  . IR US GUIDE VASC ACCESS RIGHT  10/04/2016    SOCIAL HISTORY: Social History   Social History  . Marital status: Single    Spouse name: N/A  . Number of children: N/A  . Years of education: N/A  Occupational History  . Not on file.   Social History Main Topics  . Smoking status: Never Smoker  . Smokeless tobacco: Never Used  . Alcohol use Yes     Comment: socially  . Drug use: Yes    Types: Marijuana     Comment: medical   . Sexual activity: Not on file   Other Topics Concern  . Not on file    Social History Narrative  . No narrative on file    FAMILY HISTORY: Notes her first cousin on her mom's side had some form off rare bone marrow cancer Does not know history on her father's side of the family Maternal grandmother-hypertension, diabetes, fibromyalgia Mother anemia Other relatives on her mom's side with lymphoma and bladder cancer.  ALLERGIES:  is allergic to shellfish-derived products.  MEDICATIONS:  Current Outpatient Prescriptions  Medication Sig Dispense Refill  . Acetaminophen-Pamabrom (WOMENS TYLENOL PO) Take by mouth.    [name] albuterol (PROVENTIL HFA;VENTOLIN HFA) 108 (90 Base) MCG/ACT inhaler Inhale 2 puffs into the lungs every 6 (six) hours as needed for wheezing or shortness of breath. 1 Inhaler 2  . Ascorbic Acid (VITAMIN C PO) Take by mouth.    . Cholecalciferol (VITAMIN D PO) Take by mouth.    . dexamethasone (DECADRON) 4 MG tablet Take 2 tablets by mouth once a day on the day after chemotherapy and then take 2 tablets two times a day for 2 days. Take with food. 30 tablet 1  . HYDROcodone-acetaminophen (NORCO/VICODIN) 5-325 MG tablet Take 1-2 tablets by mouth every 6 (six) hours as needed for moderate pain or severe pain. 30 tablet 0  . lidocaine-prilocaine (EMLA) cream Apply to affected area once 30 g 3  . LORazepam (ATIVAN) 0.5 MG tablet Take 1 tablet (0.5 mg total) by mouth every 6 (six) hours as needed (Nausea or vomiting). 30 tablet 0  . magic mouthwash w/lidocaine SOLN Take 5 mLs by mouth 4 (four) times daily as needed for mouth pain. 120 mL 0  . ondansetron (ZOFRAN) 8 MG tablet Take 1 tablet (8 mg total) by mouth 2 (two) times daily as needed. Start on the third day after chemotherapy. 30 tablet 1  . prochlorperazine (COMPAZINE) 10 MG tablet Take 1 tablet (10 mg total) by mouth every 6 (six) hours as needed (Nausea or vomiting). 30 tablet 1  . senna-docusate (SENNA S) 8.6-50 MG tablet Take 2 tablets by mouth at bedtime. Hold if diarrhea ensues 60 tablet 1   . EPINEPHrine 0.3 mg/0.3 mL IJ SOAJ injection Inject 0.3 mLs (0.3 mg total) into the muscle once. For anaphylactic reactions 1 Device 0   No current facility-administered medications for this visit.    Facility-Administered Medications Ordered in Other Visits  Medication Dose Route Frequency Provider Last Rate Last Dose  . sodium chloride flush (NS) 0.9 % injection 10 mL  10 mL Intravenous PRN Brunetta Genera, MD   10 mL at 10/19/16 1100    REVIEW OF SYSTEMS:    10 Point review of Systems was done is negative except as noted above.  PHYSICAL EXAMINATION: ECOG PERFORMANCE STATUS: 1 - Symptomatic but completely ambulatory  . Vitals:   10/19/16 0840  BP: (!) 146/97  Pulse: 81  Resp: 18  Temp: 98 F (36.7 C)   Filed Weights   10/19/16 0840  Weight: 158 lb 12.8 oz (72 kg)   .Body mass index is 30 kg/m.  GENERAL:alert, in no acute distress and comfortable SKIN: no acute rashes, no significant  lesions EYES: conjunctiva are pink and non-injected, sclera anicteric OROPHARYNX: MMM, no exudates, no oropharyngeal erythema or ulceration NECK: supple, no JVD LYMPH:  Patient has palpable lymphadenopathy bilaterally in her neck , occipital area, supraclavicular, bilateral axillary and inguinal areas  LUNGS: clear to auscultation b/l with normal respiratory effort HEART: regular rate & rhythm ABDOMEN:  normoactive bowel sounds , non tender, not distended. Borderline palpable splenomegaly, no palpable hepatomegaly. Extremity: no pedal edema PSYCH: alert & oriented x 3 with fluent speech NEURO: no focal motor/sensory deficits  LABORATORY DATA:  I have reviewed the data as listed  . CBC Latest Ref Rng & Units 10/19/2016 10/12/2016 10/04/2016  WBC 3.9 - 10.3 10e3/uL 2.5(L) 4.7 5.9  Hemoglobin 11.6 - 15.9 g/dL 10.8(L) 10.2(L) 10.8(L)  Hematocrit 34.8 - 46.6 % 33.7(L) 32.1(L) 33.4(L)  Platelets 145 - 400 10e3/uL 256 247 334   . CBC    Component Value Date/Time   WBC 2.5 (L)  10/19/2016 0820   WBC 5.9 10/04/2016 0747   RBC 4.19 10/19/2016 0820   RBC 4.23 10/04/2016 0747   HGB 10.8 (L) 10/19/2016 0820   HCT 33.7 (L) 10/19/2016 0820   PLT 256 10/19/2016 0820   MCV 80.4 10/19/2016 0820   MCH 25.8 10/19/2016 0820   MCH 25.5 (L) 10/04/2016 0747   MCHC 32.0 10/19/2016 0820   MCHC 32.3 10/04/2016 0747   RDW 16.3 (H) 10/19/2016 0820   LYMPHSABS 0.4 (L) 10/19/2016 0820   MONOABS 0.0 (L) 10/19/2016 0820   EOSABS 0.3 10/19/2016 0820   BASOSABS 0.0 10/19/2016 0820    . CMP Latest Ref Rng & Units 10/19/2016 10/12/2016 10/02/2016  Glucose 70 - 140 mg/dl 98 89 80  BUN 7.0 - 26.0 mg/dL 6.2(L) 5.7(L) 6.2(L)  Creatinine 0.6 - 1.1 mg/dL 0.7 0.7 0.7  Sodium 136 - 145 mEq/L 135(L) 136 135(L)  Potassium 3.5 - 5.1 mEq/L 4.0 4.3 3.9  CO2 22 - 29 mEq/L _0 Calcium 8.4 - 10.4 mg/dL 9.8 9.2 9.3  Total Protein 6.4 - 8.3 g/dL 8.9(H) 9.0(H) 9.5(H)  Total Bilirubin 0.20 - 1.20 mg/dL 0.36 <0.22 <0.22  Alkaline Phos 40 - 150 U/L 65 81 72  AST 5 - 34 U/L _1 ALT 0 - 55 U/L _2 . Lab Results  Component Value Date   LDH 194 10/02/2016   . Lab Results  Component Value Date   IRON 26 (L) 10/02/2016   TIBC 349 10/02/2016   IRONPCTSAT 7 (L) 10/02/2016   (Iron and TIBC)  Lab Results  Component Value Date   FERRITIN 10 10/02/2016      RADIOGRAPHIC STUDIES: I have personally reviewed the radiological images as listed and agreed with the findings in the report. Nm Pet Image Initial (pi) Skull Base To Thigh  Result Date: 10/10/2016 CLINICAL DATA:  Initial treatment strategy for Hodgkin's lymphoma. EXAM: NUCLEAR MEDICINE PET SKULL BASE TO THIGH TECHNIQUE: 8.8 mCi F-18 FDG was injected intravenously. Full-ring PET imaging was performed from the skull base to thigh after the radiotracer. CT data was obtained and used for attenuation correction and anatomic localization. FASTING BLOOD GLUCOSE:  Value: 91 mg/dl COMPARISON:  None. FINDINGS: NECK Left-sided deep  parotid lymph node about 0.8 cm in short axis on image 19/4, maximum SUV 7.6. Left station IIa lymph nodes are small, measuring up to 7 mm in short axis on image 23/4, with maximum SUV 4.5. Somewhat similar small right station 2A lymph nodes. Accentuated metabolic activity  in the supraclavicular region is thought to be due to a combination of lymph nodes but also hypermetabolic brown fat. An index left supraclavicular lymph node with short axis diameter of 1.1 cm on image 40/4 has a maximum standard uptake value of 6.3. CHEST Enlarged and hypermetabolic bilateral axillary, bilateral subpectoral, and prevascular adenopathy is noted. An index left lateral axillary lymph node on image 51/4 measures 2.8 cm in short axis and has a maximum standard uptake value of 8.0. Port-A-Cath tip: Right atrium. For reference purposes, background mediastinal blood pool activity is 2.7. ABDOMEN/PELVIS Hypermetabolic and enlarged retrocrural, periaortic, iliac chain, and inguinal adenopathy. An index left eccentric periaortic lymph node measures 1.7 cm on image 112/4 and has a maximum standard uptake value of 7.6. A left common iliac node measuring 2.2 cm on image 139/4 has a maximum standard uptake value of 19.0. An index left external iliac node measuring 2.2 cm in short axis on image 105 T5 of series 4 has a maximum standard uptake value of 10.1. Left inguinal lymph node measuring 1.5 cm in short axis on image 161/4 has a maximum standard uptake value of 7.2. For reference purposes, background liver activity SUV is 3.1. SKELETON Variable degrees of hypermetabolic activity within the bony structures including the left mandible, right proximal humerus, spine, bony pelvis, and proximal femurs. An index lesion in the left iliac bone above the acetabulum has a maximum SUV of 11.4, this is one of the more hypermetabolic foci although many of the lesions have less striking but still abnormal activity. There appears to be a small  hypermetabolic right brachial lymph node in the right upper extremity near the proximal humeral metaphysis which is mildly hypermetabolic, this measures 8 mm in short axis diameter on image 31/4 and has maximum SUV of 5.1. IMPRESSION: 1. Extensive hypermetabolic adenopathy in the chest, abdomen, and pelvis compatible with active lymphoma, maximum standard uptake value measurements up to 19.0 (Deauville 5). 2. Scattered hypermetabolic activity in the axial and appendicular skeleton, much of which is Deauville 5. 3. Level IIa lymph nodes in the neck are small and Deauville 4. Electronically Signed   By: Van Clines M.D.   On: 10/10/2016 15:29   Ir US Guide Vasc Access Right  Result Date: 10/04/2016 INDICATION: History of lymphoma. In need of durable intravenous access for chemotherapy administration. EXAM: IMPLANTED PORT A CATH PLACEMENT WITH ULTRASOUND AND FLUOROSCOPIC GUIDANCE COMPARISON:  None. MEDICATIONS: Ancef 2 gm IV; The antibiotic was administered within an appropriate time interval prior to skin puncture. ANESTHESIA/SEDATION: Moderate (conscious) sedation was employed during this procedure. A total of Versed 3 mg and Fentanyl 100 mcg was administered intravenously. Moderate Sedation Time: 27 minutes. The patient's level of consciousness and vital signs were monitored continuously by radiology nursing throughout the procedure under my direct supervision. CONTRAST:  None FLUOROSCOPY TIME:  36 seconds (7.8 MGy) COMPLICATIONS: None immediate. PROCEDURE: The procedure, risks, benefits, and alternatives were explained to the patient. Questions regarding the procedure were encouraged and answered. The patient understands and consents to the procedure. The right neck and chest were prepped with chlorhexidine in a sterile fashion, and a sterile drape was applied covering the operative field. Maximum barrier sterile technique with sterile gowns and gloves were used for the procedure. A timeout was performed  prior to the initiation of the procedure. Local anesthesia was provided with 1% lidocaine with epinephrine. After creating a small venotomy incision, a micropuncture kit was utilized to access the internal jugular vein under direct, real-time  ultrasound guidance. Ultrasound image documentation was performed. The microwire was kinked to measure appropriate catheter length. A subcutaneous port pocket was then created along the upper chest wall utilizing a combination of sharp and blunt dissection. The pocket was irrigated with sterile saline. A single lumen ISP power injectable port was chosen for placement. The 8 Fr catheter was tunneled from the port pocket site to the venotomy incision. The port was placed in the pocket. The external catheter was trimmed to appropriate length. At the venotomy, an 8 Fr peel-away sheath was placed over a guidewire under fluoroscopic guidance. The catheter was then placed through the sheath and the sheath was removed. Final catheter positioning was confirmed and documented with a fluoroscopic spot radiograph. The port was accessed with a Huber needle, aspirated and flushed with heparinized saline. The venotomy site was closed with an interrupted 4-0 Vicryl suture. The port pocket incision was closed with interrupted 2-0 Vicryl suture and the skin was opposed with a running subcuticular 4-0 Vicryl suture. Dermabond and Steri-strips were applied to both incisions. Dressings were placed. The patient tolerated the procedure well without immediate post procedural complication. FINDINGS: After catheter placement, the tip lies within the superior aspect of the right atrium. The catheter aspirates and flushes normally and is ready for immediate use. IMPRESSION: Successful placement of a right internal jugular approach power injectable Port-A-Cath. The catheter is ready for immediate use. Electronically Signed   By: Sandi Mariscal M.D.   On: 10/04/2016 10:27   Ir Fluoro Guide Port Insertion  Right  Result Date: 10/04/2016 INDICATION: History of lymphoma. In need of durable intravenous access for chemotherapy administration. EXAM: IMPLANTED PORT A CATH PLACEMENT WITH ULTRASOUND AND FLUOROSCOPIC GUIDANCE COMPARISON:  None. MEDICATIONS: Ancef 2 gm IV; The antibiotic was administered within an appropriate time interval prior to skin puncture. ANESTHESIA/SEDATION: Moderate (conscious) sedation was employed during this procedure. A total of Versed 3 mg and Fentanyl 100 mcg was administered intravenously. Moderate Sedation Time: 27 minutes. The patient's level of consciousness and vital signs were monitored continuously by radiology nursing throughout the procedure under my direct supervision. CONTRAST:  None FLUOROSCOPY TIME:  36 seconds (7.8 MGy) COMPLICATIONS: None immediate. PROCEDURE: The procedure, risks, benefits, and alternatives were explained to the patient. Questions regarding the procedure were encouraged and answered. The patient understands and consents to the procedure. The right neck and chest were prepped with chlorhexidine in a sterile fashion, and a sterile drape was applied covering the operative field. Maximum barrier sterile technique with sterile gowns and gloves were used for the procedure. A timeout was performed prior to the initiation of the procedure. Local anesthesia was provided with 1% lidocaine with epinephrine. After creating a small venotomy incision, a micropuncture kit was utilized to access the internal jugular vein under direct, real-time ultrasound guidance. Ultrasound image documentation was performed. The microwire was kinked to measure appropriate catheter length. A subcutaneous port pocket was then created along the upper chest wall utilizing a combination of sharp and blunt dissection. The pocket was irrigated with sterile saline. A single lumen ISP power injectable port was chosen for placement. The 8 Fr catheter was tunneled from the port pocket site to the  venotomy incision. The port was placed in the pocket. The external catheter was trimmed to appropriate length. At the venotomy, an 8 Fr peel-away sheath was placed over a guidewire under fluoroscopic guidance. The catheter was then placed through the sheath and the sheath was removed. Final catheter positioning was confirmed and documented  with a fluoroscopic spot radiograph. The port was accessed with a Huber needle, aspirated and flushed with heparinized saline. The venotomy site was closed with an interrupted 4-0 Vicryl suture. The port pocket incision was closed with interrupted 2-0 Vicryl suture and the skin was opposed with a running subcuticular 4-0 Vicryl suture. Dermabond and Steri-strips were applied to both incisions. Dressings were placed. The patient tolerated the procedure well without immediate post procedural complication. FINDINGS: After catheter placement, the tip lies within the superior aspect of the right atrium. The catheter aspirates and flushes normally and is ready for immediate use. IMPRESSION: Successful placement of a right internal jugular approach power injectable Port-A-Cath. The catheter is ready for immediate use. Electronically Signed   By: Sandi Mariscal M.D.   On: 10/04/2016 10:27    ASSESSMENT & PLAN:   26 year old female with   #1 Classical Hodgkin's lymphoma Stage IVB as per PET/CT Patient has constitutional symptoms with significant weight loss of 40 pounds, and night sweats and debilitating fatigue. PLAN -Patient is here for toxicity check. Labs are stable. No overt neutropenia at this time.  We'll continue the same supportive medications. -We shall add albuterol to her premedications due to some wheezing during her previous treatment - will continue treatment as per schedule. -I shall see her back in 3 weeks with cycle 2 day 1 of treatment. -Counseled on importance for infection prevention.  #2 history of iron deficiency anemia Has been on oral iron that  causes significant constipation and is still significantly anemic. Plan -IV Injectafer 781m po x 1 dose today. -we hope this helps her fatigue and reduce significance of possible ctx related anemia.  #3 history of asthma. [place] been mild [place] -continue albuterol inhaler on an as-needed basis  #4 history of shellfish allergies with anaphylaxis/angioedema. Patient notes that she tolerates eating shrimp. -Prescribed EpiPen for when necessary use. -Recommended also to carry Benadryl.  #5 general medical cares -Patient was recommended to establish a primary care physician as soon as possible.  Continue ABVD as per schedule starting today IV Injectafer [id number] 1 dose today Iron deficiency RTC with Dr KaIrene Limbon 3 week with labsC2D1 of treatment.  I spent 20 minutes counseling the patient face to face. The total time spent in the appointment was 25 minutes and more than 50% was on counseling and direct patient cares.    GaSullivan LoneD MSHendersonAHIVMS SCQuad City Ambulatory Surgery Center LLCTBaptist Memorial Hospital - Union Cityematology/Oncology Physician CoDauterive [facility](Office):       [phone] cell):  [phone]):           [phone]

## 2016-10-23 ENCOUNTER — Encounter: Payer: Self-pay | Admitting: *Deleted

## 2016-10-23 ENCOUNTER — Telehealth: Payer: Self-pay | Admitting: *Deleted

## 2016-10-23 NOTE — Telephone Encounter (Signed)
"  I need to receive paperwork, a letter stating I am unable to work.  Call me [phone]) to discuss further details."

## 2016-10-26 ENCOUNTER — Ambulatory Visit (HOSPITAL_BASED_OUTPATIENT_CLINIC_OR_DEPARTMENT_OTHER): Payer: Self-pay

## 2016-10-26 ENCOUNTER — Other Ambulatory Visit (HOSPITAL_BASED_OUTPATIENT_CLINIC_OR_DEPARTMENT_OTHER): Payer: Self-pay

## 2016-10-26 ENCOUNTER — Other Ambulatory Visit: Payer: Self-pay | Admitting: Hematology

## 2016-10-26 VITALS — BP 126/82 | HR 80 | Temp 98.2°F | Resp 20

## 2016-10-26 DIAGNOSIS — Z5189 Encounter for other specified aftercare: Secondary | ICD-10-CM

## 2016-10-26 DIAGNOSIS — C8194 Hodgkin lymphoma, unspecified, lymph nodes of axilla and upper limb: Secondary | ICD-10-CM

## 2016-10-26 DIAGNOSIS — C8198 Hodgkin lymphoma, unspecified, lymph nodes of multiple sites: Secondary | ICD-10-CM

## 2016-10-26 DIAGNOSIS — Z5111 Encounter for antineoplastic chemotherapy: Secondary | ICD-10-CM

## 2016-10-26 LAB — CBC WITH DIFFERENTIAL/PLATELET
BASO%: 2.8 % — ABNORMAL HIGH (ref 0.0–2.0)
BASOS ABS: 0.1 10*3/uL (ref 0.0–0.1)
EOS%: 11.2 % — AB (ref 0.0–7.0)
Eosinophils Absolute: 0.2 10*3/uL (ref 0.0–0.5)
HCT: 31 % — ABNORMAL LOW (ref 34.8–46.6)
HEMOGLOBIN: 10.2 g/dL — AB (ref 11.6–15.9)
LYMPH%: 40.9 % (ref 14.0–49.7)
MCH: 27.2 pg (ref 25.1–34.0)
MCHC: 32.8 g/dL (ref 31.5–36.0)
MCV: 82.7 fL (ref 79.5–101.0)
MONO#: 0.5 10*3/uL (ref 0.1–0.9)
MONO%: 22.9 % — AB (ref 0.0–14.0)
NEUT%: 22.2 % — ABNORMAL LOW (ref 38.4–76.8)
NEUTROS ABS: 0.5 10*3/uL — AB (ref 1.5–6.5)
Platelets: 269 10*3/uL (ref 145–400)
RBC: 3.74 10*6/uL (ref 3.70–5.45)
RDW: 20.7 % — AB (ref 11.2–14.5)
WBC: 2.1 10*3/uL — AB (ref 3.9–10.3)
lymph#: 0.9 10*3/uL (ref 0.9–3.3)

## 2016-10-26 LAB — COMPREHENSIVE METABOLIC PANEL WITH GFR
ALT: 15 U/L (ref 0–55)
AST: 18 U/L (ref 5–34)
Albumin: 2.9 g/dL — ABNORMAL LOW (ref 3.5–5.0)
Alkaline Phosphatase: 49 U/L (ref 40–150)
Anion Gap: 6 meq/L (ref 3–11)
BUN: 3.1 mg/dL — ABNORMAL LOW (ref 7.0–26.0)
CO2: 26 meq/L (ref 22–29)
Calcium: 8.3 mg/dL — ABNORMAL LOW (ref 8.4–10.4)
Chloride: 106 meq/L (ref 98–109)
Creatinine: 0.6 mg/dL (ref 0.6–1.1)
EGFR: >90 ml/min/1.73 m2 (ref >90)
Glucose: 93 mg/dL (ref 70–140)
Potassium: 3.8 meq/L (ref 3.5–5.1)
Sodium: 138 meq/L (ref 136–145)
Total Bilirubin: <0.22 mg/dL (ref 0.20–1.20)
Total Protein: 6.8 g/dL (ref 6.4–8.3)

## 2016-10-26 MED ORDER — DOXORUBICIN HCL CHEMO IV INJECTION 2 MG/ML
25.0000 mg/m2 | Freq: Once | INTRAVENOUS | Status: AC
  Administered 2016-10-26: 44 mg via INTRAVENOUS
  Filled 2016-10-26: qty 22

## 2016-10-26 MED ORDER — PEGFILGRASTIM 6 MG/0.6ML ~~LOC~~ PSKT
6.0000 mg | PREFILLED_SYRINGE | Freq: Once | SUBCUTANEOUS | Status: AC
  Administered 2016-10-26: 6 mg via SUBCUTANEOUS
  Filled 2016-10-26: qty 0.6

## 2016-10-26 MED ORDER — PALONOSETRON HCL INJECTION 0.25 MG/5ML
INTRAVENOUS | Status: AC
  Filled 2016-10-26: qty 5

## 2016-10-26 MED ORDER — SODIUM CHLORIDE 0.9 % IV SOLN
375.0000 mg/m2 | Freq: Once | INTRAVENOUS | Status: AC
  Administered 2016-10-26: 660 mg via INTRAVENOUS
  Filled 2016-10-26: qty 33

## 2016-10-26 MED ORDER — ALBUTEROL SULFATE (2.5 MG/3ML) 0.083% IN NEBU
2.5000 mg | INHALATION_SOLUTION | Freq: Once | RESPIRATORY_TRACT | Status: AC
  Administered 2016-10-26: 2.5 mg via RESPIRATORY_TRACT
  Filled 2016-10-26: qty 3

## 2016-10-26 MED ORDER — VINBLASTINE SULFATE CHEMO INJECTION 1 MG/ML
5.6000 mg/m2 | Freq: Once | INTRAVENOUS | Status: AC
  Administered 2016-10-26: 10 mg via INTRAVENOUS
  Filled 2016-10-26: qty 10

## 2016-10-26 MED ORDER — SODIUM CHLORIDE 0.9 % IV SOLN
Freq: Once | INTRAVENOUS | Status: AC
  Administered 2016-10-26: 15:00:00 via INTRAVENOUS
  Filled 2016-10-26: qty 5

## 2016-10-26 MED ORDER — ALBUTEROL SULFATE (2.5 MG/3ML) 0.083% IN NEBU
INHALATION_SOLUTION | RESPIRATORY_TRACT | Status: AC
  Filled 2016-10-26: qty 3

## 2016-10-26 MED ORDER — SODIUM CHLORIDE 0.9 % IV SOLN
10.0000 [IU]/m2 | Freq: Once | INTRAVENOUS | Status: AC
  Administered 2016-10-26: 18 [IU] via INTRAVENOUS
  Filled 2016-10-26: qty 6

## 2016-10-26 MED ORDER — PALONOSETRON HCL INJECTION 0.25 MG/5ML
0.2500 mg | Freq: Once | INTRAVENOUS | Status: AC
  Administered 2016-10-26: 0.25 mg via INTRAVENOUS

## 2016-10-26 MED ORDER — SODIUM CHLORIDE 0.9 % IV SOLN
Freq: Once | INTRAVENOUS | Status: AC
  Administered 2016-10-26: 15:00:00 via INTRAVENOUS

## 2016-10-26 MED ORDER — HEPARIN SOD (PORK) LOCK FLUSH 100 UNIT/ML IV SOLN
500.0000 [IU] | Freq: Once | INTRAVENOUS | Status: AC | PRN
  Administered 2016-10-26: 500 [IU]
  Filled 2016-10-26: qty 5

## 2016-10-26 MED ORDER — SODIUM CHLORIDE 0.9% FLUSH
10.0000 mL | INTRAVENOUS | Status: DC | PRN
Start: 2016-10-26 — End: 2016-10-26
  Administered 2016-10-26: 10 mL
  Filled 2016-10-26: qty 10

## 2016-10-26 NOTE — Patient Instructions (Signed)
[facility] Discharge Instructions for Patients Receiving Chemotherapy  Today you received the following chemotherapy agents Adriamycin, Vinblastine, Bleomycin and DTIC  To help prevent nausea and vomiting after your treatment, we encourage you to take your nausea medication as directed. No Zofran for 3 days. Take Compazine instead.    If you develop nausea and vomiting that is not controlled by your nausea medication, call the clinic.   BELOW ARE SYMPTOMS THAT SHOULD BE REPORTED IMMEDIATELY:  *FEVER GREATER THAN 100.5 F  *CHILLS WITH OR WITHOUT FEVER  NAUSEA AND VOMITING THAT IS NOT CONTROLLED WITH YOUR NAUSEA MEDICATION  *UNUSUAL SHORTNESS OF BREATH  *UNUSUAL BRUISING OR BLEEDING  TENDERNESS IN MOUTH AND THROAT WITH OR WITHOUT PRESENCE OF ULCERS  *URINARY PROBLEMS  *BOWEL PROBLEMS  UNUSUAL RASH Items with * indicate a potential emergency and should be followed up as soon as possible.  Feel free to call the clinic you have any questions or concerns. The clinic phone number is [phone].  Please show the [place] [place] at check-in to the Emergency Department and triage nurse.  Doxorubicin injection What is this medicine? DOXORUBICIN (dox oh ROO bi sin) is a chemotherapy drug. It is used to treat many kinds of cancer like leukemia, lymphoma, neuroblastoma, sarcoma, and Wilms' tumor. It is also used to treat bladder cancer, breast cancer, lung cancer, ovarian cancer, stomach cancer, and thyroid cancer. This medicine may be used for other purposes; ask your health care provider or pharmacist if you have questions. COMMON BRAND NAME(S): Adriamycin, Adriamycin PFS, Adriamycin RDF, Rubex What should I tell my health care provider before I take this medicine? They need to know if you have any of these conditions: -heart disease -history of low blood counts caused by a medicine -liver disease -recent or ongoing radiation therapy -an unusual or allergic  reaction to doxorubicin, other chemotherapy agents, other medicines, foods, dyes, or preservatives -pregnant or trying to get pregnant -breast-feeding How should I use this medicine? This drug is given as an infusion into a vein. It is administered in a hospital or clinic by a specially trained health care professional. If you have pain, swelling, burning or any unusual feeling around the site of your injection, tell your health care professional right away. Talk to your pediatrician regarding the use of this medicine in children. Special care may be needed. Overdosage: If you think you have taken too much of this medicine contact a poison control center or emergency room at once. NOTE: This medicine is only for you. Do not share this medicine with others. What if I miss a dose? It is important not to miss your dose. Call your doctor or health care professional if you are unable to keep an appointment. What may interact with this medicine? This medicine may interact with the following medications: -6-mercaptopurine -paclitaxel -phenytoin -St. John's Wort -trastuzumab -verapamil This list may not describe all possible interactions. Give your health care provider a list of all the medicines, herbs, non-prescription drugs, or dietary supplements you use. Also tell them if you smoke, drink alcohol, or use illegal drugs. Some items may interact with your medicine. What should I watch for while using this medicine? This drug may make you feel generally unwell. This is not uncommon, as chemotherapy can affect healthy cells as well as cancer cells. Report any side effects. Continue your course of treatment even though you feel ill unless your doctor tells you to stop. There is a maximum amount of this medicine  you should receive throughout your life. The amount depends on the medical condition being treated and your overall health. Your doctor will watch how much of this medicine you receive in your  lifetime. Tell your doctor if you have taken this medicine before. You may need blood work done while you are taking this medicine. Your urine may turn red for a few days after your dose. This is not blood. If your urine is dark or brown, call your doctor. In some cases, you may be given additional medicines to help with side effects. Follow all directions for their use. Call your doctor or health care professional for advice if you get a fever, chills or sore throat, or other symptoms of a cold or flu. Do not treat yourself. This drug decreases your body's ability to fight infections. Try to avoid being around people who are sick. This medicine may increase your risk to bruise or bleed. Call your doctor or health care professional if you notice any unusual bleeding. Talk to your doctor about your risk of cancer. You may be more at risk for certain types of cancers if you take this medicine. Do not become pregnant while taking this medicine or for 6 months after stopping it. Women should inform their doctor if they wish to become pregnant or think they might be pregnant. Men should not father a child while taking this medicine and for 6 months after stopping it. There is a potential for serious side effects to an unborn child. Talk to your health care professional or pharmacist for more information. Do not breast-feed an infant while taking this medicine. This medicine has caused ovarian failure in some women and reduced sperm counts in some men This medicine may interfere with the ability to have a child. Talk with your doctor or health care professional if you are concerned about your fertility. What side effects may I notice from receiving this medicine? Side effects that you should report to your doctor or health care professional as soon as possible: -allergic reactions like skin rash, itching or hives, swelling of the face, lips, or tongue -breathing problems -chest pain -fast or irregular  heartbeat -low blood counts - this medicine may decrease the number of white blood cells, red blood cells and platelets. You may be at increased risk for infections and bleeding. -pain, redness, or irritation at site where injected -signs of infection - fever or chills, cough, sore throat, pain or difficulty passing urine -signs of decreased platelets or bleeding - bruising, pinpoint red spots on the skin, black, tarry stools, blood in the urine -swelling of the ankles, feet, hands -tiredness -weakness Side effects that usually do not require medical attention (report to your doctor or health care professional if they continue or are bothersome): -diarrhea -hair loss -mouth sores -nail discoloration or damage -nausea -red colored urine -vomiting This list may not describe all possible side effects. Call your doctor for medical advice about side effects. You may report side effects to FDA at 1-800-FDA-1088. Where should I keep my medicine? This drug is given in a hospital or clinic and will not be stored at home. NOTE: This sheet is a summary. It may not cover all possible information. If you have questions about this medicine, talk to your doctor, pharmacist, or health care provider.  2018 Elsevier/Gold Standard (2015-07-18 11:28:51)  Vinblastine injection What is this medicine? VINBLASTINE (vin BLAS teen) is a chemotherapy drug. It slows the growth of cancer cells. This medicine is used  to treat many types of cancer like breast cancer, testicular cancer, Hodgkin's disease, non-Hodgkin's lymphoma, and sarcoma. This medicine may be used for other purposes; ask your health care provider or pharmacist if you have questions. COMMON BRAND NAME(S): Velban What should I tell my health care provider before I take this medicine? They need to know if you have any of these conditions: -blood disorders -dental disease -gout -infection (especially a virus infection such as chickenpox, cold sores,  or herpes) -liver disease -lung disease -nervous system disease -recent or ongoing radiation therapy -an unusual or allergic reaction to vinblastine, other chemotherapy agents, other medicines, foods, dyes, or preservatives -pregnant or trying to get pregnant -breast-feeding How should I use this medicine? This drug is given as an infusion into a vein. It is administered in a hospital or clinic by a specially trained health care professional. If you have pain, swelling, burning or any unusual feeling around the site of your injection, tell your health care professional right away. Talk to your pediatrician regarding the use of this medicine in children. While this drug may be prescribed for selected conditions, precautions do apply. Overdosage: If you think you have taken too much of this medicine contact a poison control center or emergency room at once. NOTE: This medicine is only for you. Do not share this medicine with others. What if I miss a dose? It is important not to miss your dose. Call your doctor or health care professional if you are unable to keep an appointment. What may interact with this medicine? Do not take this medicine with any of the following medications: -erythromycin -itraconazole -mibefradil -voriconazole This medicine may also interact with the following medications: -cyclosporine -fluconazole -ketoconazole -medicines for seizures like phenytoin -medicines to increase blood counts like filgrastim, pegfilgrastim, sargramostim -vaccines -verapamil Talk to your doctor or health care professional before taking any of these medicines: -acetaminophen -aspirin -ibuprofen -ketoprofen -naproxen This list may not describe all possible interactions. Give your health care provider a list of all the medicines, herbs, non-prescription drugs, or dietary supplements you use. Also tell them if you smoke, drink alcohol, or use illegal drugs. Some items may interact with  your medicine. What should I watch for while using this medicine? Your condition will be monitored carefully while you are receiving this medicine. You will need important blood work done while you are taking this medicine. This drug may make you feel generally unwell. This is not uncommon, as chemotherapy can affect healthy cells as well as cancer cells. Report any side effects. Continue your course of treatment even though you feel ill unless your doctor tells you to stop. In some cases, you may be given additional medicines to help with side effects. Follow all directions for their use. Call your doctor or health care professional for advice if you get a fever, chills or sore throat, or other symptoms of a cold or flu. Do not treat yourself. This drug decreases your body's ability to fight infections. Try to avoid being around people who are sick. This medicine may increase your risk to bruise or bleed. Call your doctor or health care professional if you notice any unusual bleeding. Be careful brushing and flossing your teeth or using a toothpick because you may get an infection or bleed more easily. If you have any dental work done, tell your dentist you are receiving this medicine. Avoid taking products that contain aspirin, acetaminophen, ibuprofen, naproxen, or ketoprofen unless instructed by your doctor. These medicines may hide  a fever. Do not become pregnant while taking this medicine. Women should inform their doctor if they wish to become pregnant or think they might be pregnant. There is a potential for serious side effects to an unborn child. Talk to your health care professional or pharmacist for more information. Do not breast-feed an infant while taking this medicine. Men may have a lower sperm count while taking this medicine. Talk to your doctor if you plan to father a child. What side effects may I notice from receiving this medicine? Side effects that you should report to your doctor  or health care professional as soon as possible: -allergic reactions like skin rash, itching or hives, swelling of the face, lips, or tongue -low blood counts - This drug may decrease the number of white blood cells, red blood cells and platelets. You may be at increased risk for infections and bleeding. -signs of infection - fever or chills, cough, sore throat, pain or difficulty passing urine -signs of decreased platelets or bleeding - bruising, pinpoint red spots on the skin, black, tarry stools, nosebleeds -signs of decreased red blood cells - unusually weak or tired, fainting spells, lightheadedness -breathing problems -changes in hearing -change in the amount of urine -chest pain -high blood pressure -mouth sores -nausea and vomiting -pain, swelling, redness or irritation at the injection site -pain, tingling, numbness in the hands or feet -problems with balance, dizziness -seizures Side effects that usually do not require medical attention (report to your doctor or health care professional if they continue or are bothersome): -constipation -hair loss -jaw pain -loss of appetite -sensitivity to light -stomach pain -tumor pain This list may not describe all possible side effects. Call your doctor for medical advice about side effects. You may report side effects to FDA at 1-800-FDA-1088. Where should I keep my medicine? This drug is given in a hospital or clinic and will not be stored at home. NOTE: This sheet is a summary. It may not cover all possible information. If you have questions about this medicine, talk to your doctor, pharmacist, or health care provider.  2018 Elsevier/Gold Standard (2008-02-16 17:15:59)  Bleomycin injection What is this medicine? BLEOMYCIN (blee oh MYE sin) is a chemotherapy drug. It is used to treat many kinds of cancer like lymphoma, cervical cancer, head and neck cancer, and testicular cancer. It is also used to prevent and to treat fluid  build-up around the lungs caused by some cancers. This medicine may be used for other purposes; ask your health care provider or pharmacist if you have questions. COMMON BRAND NAME(S): Blenoxane What should I tell my health care provider before I take this medicine? They need to know if you have any of these conditions: -cigarette smoker -kidney disease -lung disease -recent or ongoing radiation therapy -an unusual or allergic reaction to bleomycin, other chemotherapy agents, other medicines, foods, dyes, or preservatives -pregnant or trying to get pregnant -breast-feeding How should I use this medicine? This drug is given as an infusion into a vein or a body cavity. It can also be given as an injection into a muscle or under the skin. It is administered in a hospital or clinic by a specially trained health care professional. Talk to your pediatrician regarding the use of this medicine in children. Special care may be needed. Overdosage: If you think you have taken too much of this medicine contact a poison control center or emergency room at once. NOTE: This medicine is only for you. Do not share  this medicine with others. What if I miss a dose? It is important not to miss your dose. Call your doctor or health care professional if you are unable to keep an appointment. What may interact with this medicine? -certain antibiotics given by injection -cisplatin -cyclosporine -diuretics -foscarnet -medicines to increase blood counts like filgrastim, pegfilgrastim, sargramostim -vaccines This list may not describe all possible interactions. Give your health care provider a list of all the medicines, herbs, non-prescription drugs, or dietary supplements you use. Also tell them if you smoke, drink alcohol, or use illegal drugs. Some items may interact with your medicine. What should I watch for while using this medicine? Visit your doctor for checks on your progress. This drug may make you feel  generally unwell. This is not uncommon, as chemotherapy can affect healthy cells as well as cancer cells. Report any side effects. Continue your course of treatment even though you feel ill unless your doctor tells you to stop. Call your doctor or health care professional for advice if you get a fever, chills or sore throat, or other symptoms of a cold or flu. Do not treat yourself. This drug decreases your body's ability to fight infections. Try to avoid being around people who are sick. Avoid taking products that contain aspirin, acetaminophen, ibuprofen, naproxen, or ketoprofen unless instructed by your doctor. These medicines may hide a fever. Do not become pregnant while taking this medicine. Women should inform their doctor if they wish to become pregnant or think they might be pregnant. There is a potential for serious side effects to an unborn child. Talk to your health care professional or pharmacist for more information. Do not breast-feed an infant while taking this medicine. There is a maximum amount of this medicine you should receive throughout your life. The amount depends on the medical condition being treated and your overall health. Your doctor will watch how much of this medicine you receive in your lifetime. Tell your doctor if you have taken this medicine before. What side effects may I notice from receiving this medicine? Side effects that you should report to your doctor or health care professional as soon as possible: -allergic reactions like skin rash, itching or hives, swelling of the face, lips, or tongue -breathing problems -chest pain -confusion -cough -fast, irregular heartbeat -feeling faint or lightheaded, falls -fever or chills -mouth sores -pain, tingling, numbness in the hands or feet -trouble passing urine or change in the amount of urine -yellowing of the eyes or skin Side effects that usually do not require medical attention (report to your doctor or health  care professional if they continue or are bothersome): -darker skin color -hair loss -irritation at site where injected -loss of appetite -nail changes -nausea and vomiting -weight loss This list may not describe all possible side effects. Call your doctor for medical advice about side effects. You may report side effects to FDA at 1-800-FDA-1088. Where should I keep my medicine? This drug is given in a hospital or clinic and will not be stored at home. NOTE: This sheet is a summary. It may not cover all possible information. If you have questions about this medicine, talk to your doctor, pharmacist, or health care provider.  2018 Else[employer]/Gold Standard (2012-09-16 09:36:48)  Dacarbazine, DTIC injection What is this medicine? DACARBAZINE (da KAR ba zeen) is a chemotherapy drug. This medicine is used to treat skin cancer. It is also used with other medicines to treat Hodgkin's disease. This medicine may be used for other  purposes; ask your health care provider or pharmacist if you have questions. COMMON BRAND NAME(S): DTIC-Dome What should I tell my health care provider before I take this medicine? They need to know if you have any of these conditions: -infection (especially virus infection such as chickenpox, cold sores, or herpes) -kidney disease -liver disease -low blood counts like low platelets, red blood cells, white blood cells -recent radiation therapy -an unusual or allergic reaction to dacarbazine, other chemotherapy agents, other medicines, foods, dyes, or preservatives -pregnant or trying to get pregnant -breast-feeding How should I use this medicine? This drug is given as an injection or infusion into a vein. It is administered in a hospital or clinic by a specially trained health care professional. Talk to your pediatrician regarding the use of this medicine in children. While this drug may be prescribed for selected conditions, precautions do apply. Overdosage: If you  think you have taken too much of this medicine contact a poison control center or emergency room at once. NOTE: This medicine is only for you. Do not share this medicine with others. What if I miss a dose? It is important not to miss your dose. Call your doctor or health care professional if you are unable to keep an appointment. What may interact with this medicine? -medicines to increase blood counts like filgrastim, pegfilgrastim, sargramostim -vaccines This list may not describe all possible interactions. Give your health care provider a list of all the medicines, herbs, non-prescription drugs, or dietary supplements you use. Also tell them if you smoke, drink alcohol, or use illegal drugs. Some items may interact with your medicine. What should I watch for while using this medicine? Your condition will be monitored carefully while you are receiving this medicine. You will need important blood work done while you are taking this medicine. This drug may make you feel generally unwell. This is not uncommon, as chemotherapy can affect healthy cells as well as cancer cells. Report any side effects. Continue your course of treatment even though you feel ill unless your doctor tells you to stop. Call your doctor or health care professional for advice if you get a fever, chills or sore throat, or other symptoms of a cold or flu. Do not treat yourself. This drug decreases your body's ability to fight infections. Try to avoid being around people who are sick. This medicine may increase your risk to bruise or bleed. Call your doctor or health care professional if you notice any unusual bleeding. Talk to your doctor about your risk of cancer. You may be more at risk for certain types of cancers if you take this medicine. Do not become pregnant while taking this medicine. Women should inform their doctor if they wish to become pregnant or think they might be pregnant. There is a potential for serious side  effects to an unborn child. Talk to your health care professional or pharmacist for more information. Do not breast-feed an infant while taking this medicine. What side effects may I notice from receiving this medicine? Side effects that you should report to your doctor or health care professional as soon as possible: -allergic reactions like skin rash, itching or hives, swelling of the face, lips, or tongue -low blood counts - this medicine may decrease the number of white blood cells, red blood cells and platelets. You may be at increased risk for infections and bleeding. -signs of infection - fever or chills, cough, sore throat, pain or difficulty passing urine -signs of decreased platelets  or bleeding - bruising, pinpoint red spots on the skin, black, tarry stools, blood in the urine -signs of decreased red blood cells - unusually weak or tired, fainting spells, lightheadedness -breathing problems -muscle pains -pain at site where injected -trouble passing urine or change in the amount of urine -vomiting -yellowing of the eyes or skin Side effects that usually do not require medical attention (report to your doctor or health care professional if they continue or are bothersome): -diarrhea -hair loss -loss of appetite -nausea -skin more sensitive to sun or ultraviolet light -stomach upset This list may not describe all possible side effects. Call your doctor for medical advice about side effects. You may report side effects to FDA at 1-800-FDA-1088. Where should I keep my medicine? This drug is given in a hospital or clinic and will not be stored at home. NOTE: This sheet is a summary. It may not cover all possible information. If you have questions about this medicine, talk to your doctor, pharmacist, or health care provider.  2018 [employer]/Gold Standard (2015-07-22 15:17:39)

## 2016-10-26 NOTE — Progress Notes (Signed)
1425 - Per MD OK to treat with ANC of 0.5. Neulasta added to treatment plan. Patient educated and voiced understanding.

## 2016-10-27 LAB — HCG, SERUM, QUALITATIVE: hCG,Beta Subunit,Qual,Serum: NEGATIVE m[IU]/mL (ref <6)

## 2016-10-30 ENCOUNTER — Other Ambulatory Visit: Payer: Self-pay | Admitting: *Deleted

## 2016-10-30 DIAGNOSIS — C8198 Hodgkin lymphoma, unspecified, lymph nodes of multiple sites: Secondary | ICD-10-CM

## 2016-10-30 MED ORDER — MAGIC MOUTHWASH W/LIDOCAINE: 5.0000 mL | Freq: Four times a day (QID) | ORAL | 0 refills | Status: DC | PRN

## 2016-10-30 MED ORDER — LORAZEPAM 0.5 MG PO TABS: 0.5000 mg | ORAL_TABLET | Freq: Four times a day (QID) | ORAL | 0 refills | Status: DC | PRN

## 2016-10-30 MED ORDER — PROCHLORPERAZINE MALEATE 10 MG PO TABS: 10.0000 mg | ORAL_TABLET | Freq: Four times a day (QID) | ORAL | 1 refills | Status: DC | PRN

## 2016-10-30 MED FILL — PROCHLORPERAZINE 10 MG TAB: 10 | 8 days supply | Qty: 30 | Fill #0

## 2016-10-30 MED FILL — LORazepam 0.5 MG TABS: 0.5 | 7 days supply | Qty: 30 | Fill #0

## 2016-10-30 MED FILL — CMPD-MMW:MYLANTA,LIDO,NYST: 6 days supply | Qty: 120 | Fill #0

## 2016-11-02 ENCOUNTER — Emergency Department (HOSPITAL_COMMUNITY)
Admission: EM | Admit: 2016-11-02 | Discharge: 2016-11-02 | Disposition: A | Discharge status: Home / Self Care | Payer: Self-pay | Attending: Emergency Medicine | Admitting: Emergency Medicine

## 2016-11-02 ENCOUNTER — Other Ambulatory Visit: Payer: Self-pay | Admitting: Hematology

## 2016-11-02 ENCOUNTER — Encounter (HOSPITAL_COMMUNITY): Payer: Self-pay | Admitting: Emergency Medicine

## 2016-11-02 DIAGNOSIS — R112 Nausea with vomiting, unspecified: Secondary | ICD-10-CM | POA: Insufficient documentation

## 2016-11-02 DIAGNOSIS — D638 Anemia in other chronic diseases classified elsewhere: Secondary | ICD-10-CM | POA: Insufficient documentation

## 2016-11-02 DIAGNOSIS — T451X5A Adverse effect of antineoplastic and immunosuppressive drugs, initial encounter: Secondary | ICD-10-CM | POA: Insufficient documentation

## 2016-11-02 DIAGNOSIS — Z8571 Personal history of Hodgkin lymphoma: Secondary | ICD-10-CM | POA: Insufficient documentation

## 2016-11-02 HISTORY — DX: Malignant (primary) neoplasm, unspecified: C80.1

## 2016-11-02 LAB — CBC
HEMATOCRIT: 36.5 % (ref 36.0–46.0)
HEMOGLOBIN: 12.2 g/dL (ref 12.0–15.0)
MCH: 27.5 pg (ref 26.0–34.0)
MCHC: 33.4 g/dL (ref 30.0–36.0)
MCV: 82.2 fL (ref 78.0–100.0)
Platelets: 219 10*3/uL (ref 150–400)
RBC: 4.44 MIL/uL (ref 3.87–5.11)
RDW: 19.5 % — ABNORMAL HIGH (ref 11.5–15.5)
WBC: 16.8 10*3/uL — AB (ref 4.0–10.5)

## 2016-11-02 LAB — COMPREHENSIVE METABOLIC PANEL
ALBUMIN: 3.2 g/dL — AB (ref 3.5–5.0)
ALT: 16 U/L (ref 14–54)
ANION GAP: 4 — AB (ref 5–15)
AST: 22 U/L (ref 15–41)
Alkaline Phosphatase: 71 U/L (ref 38–126)
BILIRUBIN TOTAL: 0.2 mg/dL — AB (ref 0.3–1.2)
BUN: 6 mg/dL (ref 6–20)
CO2: 25 mmol/L (ref 22–32)
Calcium: 8.4 mg/dL — ABNORMAL LOW (ref 8.9–10.3)
Chloride: 105 mmol/L (ref 101–111)
Creatinine, Ser: 0.58 mg/dL (ref 0.44–1.00)
GLUCOSE: 94 mg/dL (ref 65–99)
POTASSIUM: 4.2 mmol/L (ref 3.5–5.1)
Sodium: 134 mmol/L — ABNORMAL LOW (ref 135–145)
TOTAL PROTEIN: 6.6 g/dL (ref 6.5–8.1)

## 2016-11-02 LAB — URINALYSIS, ROUTINE W REFLEX MICROSCOPIC
BACTERIA UA: NONE SEEN
BILIRUBIN URINE: NEGATIVE
Glucose, UA: NEGATIVE mg/dL
KETONES UR: NEGATIVE mg/dL
LEUKOCYTES UA: NEGATIVE
NITRITE: NEGATIVE
Protein, ur: NEGATIVE mg/dL
RBC / HPF: NONE SEEN RBC/hpf (ref 0–5)
SPECIFIC GRAVITY, URINE: 1.005 (ref 1.005–1.030)
pH: 9 — ABNORMAL HIGH (ref 5.0–8.0)

## 2016-11-02 LAB — I-STAT BETA HCG BLOOD, ED (MC, WL, AP ONLY): HCG, QUANTITATIVE: 9.4 m[IU]/mL — AB (ref <5)

## 2016-11-02 LAB — HCG, QUANTITATIVE, PREGNANCY: hCG, Beta Chain, Quant, S: <1 m[IU]/mL (ref <5)

## 2016-11-02 LAB — LIPASE, BLOOD: Lipase: 18 U/L (ref 11–51)

## 2016-11-02 MED ORDER — ONDANSETRON HCL 4 MG/2ML IJ SOLN
4.0000 mg | Freq: Once | INTRAMUSCULAR | Status: AC | PRN
  Administered 2016-11-02: 4 mg via INTRAVENOUS
  Filled 2016-11-02: qty 2

## 2016-11-02 MED ORDER — HEPARIN SOD (PORK) LOCK FLUSH 100 UNIT/ML IV SOLN
500.0000 [IU] | Freq: Once | INTRAVENOUS | Status: AC
  Administered 2016-11-02: 500 [IU]
  Filled 2016-11-02: qty 5

## 2016-11-02 MED ORDER — SODIUM CHLORIDE 0.9 % IV BOLUS (SEPSIS)
1000.0000 mL | Freq: Once | INTRAVENOUS | Status: AC
  Administered 2016-11-02: 1000 mL via INTRAVENOUS

## 2016-11-02 NOTE — ED Provider Notes (Signed)
[facility] DEPT Provider Note   CSN: [id number] Arrival date & time: 11/02/16  6222     History   Chief Complaint Chief Complaint  Patient presents with  . Weakness  . Nausea  . Emesis    HPI Leah Floyd is a 26 y.o. female.  Patient is a 26 year old female with recently diagnosed Hodgkin's lymphoma. She had her second round of chemotherapy this past Friday. On Tuesday she developed abdominal discomfort, vomiting, and loose stools alternating with constipation. All has been nonbloody and nonbilious. She has felt chilled, but has not had a fever.   The history is provided by the patient.  Weakness  Primary symptoms comment: Generalized weakness. This is a new problem. The problem has been gradually worsening. There was no focality noted. There has been no fever. Associated symptoms include vomiting.  Emesis      Past Medical History:  Diagnosis Date  . Cancer [facility])    lymphoma     Patient Active Problem List   Diagnosis Date Noted  . Iron deficiency anemia due to chronic blood loss 10/12/2016  . Hodgkin's lymphoma ([place]) 10/10/2016    Past Surgical History:  Procedure Laterality Date  . ANTERIOR CRUCIATE LIGAMENT REPAIR     left  . HERNIA REPAIR     left  . IR FLUORO GUIDE PORT INSERTION RIGHT  10/04/2016  . IR US GUIDE VASC ACCESS RIGHT  10/04/2016    OB History    No data available       Home Medications    Prior to Admission medications   Medication Sig Start Date End Date Taking? Authorizing Provider  Acetaminophen-Pamabrom (WOMENS TYLENOL PO) Take by mouth.    [provider]  albuterol (PROVENTIL HFA;VENTOLIN HFA) 108 (90 Base) MCG/ACT inhaler Inhale 2 puffs into the lungs every 6 (six) hours as needed for wheezing or shortness of breath. 10/03/16   Brunetta Genera, MD  Ascorbic Acid (VITAMIN C PO) Take by mouth.    [provider]  Cholecalciferol (VITAMIN D PO) Take by mouth.    [provider]  dexamethasone  (DECADRON) 4 MG tablet Take 2 tablets by mouth once a day on the day after chemotherapy and then take 2 tablets two times a day for 2 days. Take with food. 10/12/16   Brunetta Genera, MD  EPINEPHrine 0.3 mg/0.3 mL IJ SOAJ injection Inject 0.3 mLs (0.3 mg total) into the muscle once. For anaphylactic reactions 10/03/16 10/03/16  Brunetta Genera, MD  HYDROcodone-acetaminophen (NORCO/VICODIN) 5-325 MG tablet Take 1-2 tablets by mouth every 6 (six) hours as needed for moderate pain or severe pain. 10/12/16   Brunetta Genera, MD  lidocaine-prilocaine (EMLA) cream Apply to affected area once 10/12/16   Brunetta Genera, MD  LORazepam (ATIVAN) 0.5 MG tablet Take 1 tablet (0.5 mg total) by mouth every 6 (six) hours as needed (Nausea or vomiting). 10/30/16   Brunetta Genera, MD  magic mouthwash w/lidocaine SOLN Take 5 mLs by mouth 4 (four) times daily as needed for mouth pain. 10/30/16   Brunetta Genera, MD  ondansetron (ZOFRAN) 8 MG tablet Take 1 tablet (8 mg total) by mouth 2 (two) times daily as needed. Start on the third day after chemotherapy. 10/12/16   Brunetta Genera, MD  prochlorperazine (COMPAZINE) 10 MG tablet Take 1 tablet (10 mg total) by mouth every 6 (six) hours as needed (Nausea or vomiting). 10/30/16   Brunetta Genera, MD  senna-docusate (SENNA S) 8.6-50 MG  tablet Take 2 tablets by mouth at bedtime. Hold if diarrhea ensues 10/12/16   Brunetta Genera, MD    Family History No family history on file.  Social History Social History  Substance Use Topics  . Smoking status: Never Smoker  . Smokeless tobacco: Never Used  . Alcohol use Yes     Comment: socially     Allergies   Shellfish-derived products   Review of Systems Review of Systems  Gastrointestinal: Positive for vomiting.  Neurological: Positive for weakness.  All other systems reviewed and are negative.    Physical Exam Updated Vital Signs BP (!) 129/95 (BP Location: Left Arm)   Pulse  88   Temp 98.2 F (36.8 C) (Oral)   Resp 16   LMP 10/10/2016   SpO2 100%   Physical Exam  Constitutional: She is oriented to person, place, and time. She appears well-developed and well-nourished. No distress.  HENT:  Head: Normocephalic and atraumatic.  Mucous membranes somewhat dry.  Neck: Normal range of motion. Neck supple.  Cardiovascular: Normal rate and regular rhythm.  Exam reveals no gallop and no friction rub.   No murmur heard. Pulmonary/Chest: Effort normal and breath sounds normal. No respiratory distress. She has no wheezes.  Abdominal: Soft. Bowel sounds are normal. She exhibits no distension. There is tenderness. There is no rebound and no guarding.  There is mild generalized abdominal tenderness, most notably in the epigastric region.  Musculoskeletal: Normal range of motion.  Lymphadenopathy:    She has no cervical adenopathy.  Neurological: She is alert and oriented to person, place, and time.  Skin: Skin is warm and dry. She is not diaphoretic.  Nursing note and vitals reviewed.    ED Treatments / Results  Labs (all labs ordered are listed, but only abnormal results are displayed) Labs Reviewed  LIPASE, BLOOD  COMPREHENSIVE METABOLIC PANEL  CBC  URINALYSIS, ROUTINE W REFLEX MICROSCOPIC  I-STAT BETA HCG BLOOD, ED ([facility], [facility], [facility] ONLY)    EKG  EKG Interpretation None       Radiology No results found.  Procedures Procedures (including critical care time)  Medications Ordered in ED Medications  ondansetron (ZOFRAN) injection 4 mg (not administered)     Initial Impression / Assessment and Plan / ED Course  I have reviewed the triage vital signs and the nursing notes.  Pertinent labs & imaging results that were available during my care of the patient were reviewed by me and considered in my medical decision making (see chart for details).  Patient with history of recently diagnosed Hodgkin's lymphoma presenting with nausea and vomiting after  recent chemotherapy treatment. Her laboratory studies are reassuring. She is feeling better after IV fluids and medications. I've discussed this with Dr. Irene Limbo who is her oncologist. He is in agreement with me that her symptoms are likely related to a side effect of her chemotherapy and that no hospitalization or further workup is indicated. She will be discharged, to follow-up with him in the next 2-3 days. She has nausea medications at home which she can use in the meantime.  Final Clinical Impressions(s) / ED Diagnoses   Final diagnoses:  None    New Prescriptions New Prescriptions   No medications on file     Veryl Speak, MD 11/02/16 1213

## 2016-11-02 NOTE — ED Notes (Signed)
Per EMT attempt straight stick for lab draw x2 unsuccessful. This Probation officer attempt x1 unsuccessful. Main lab phlebotomy  called and verbalized en route to attempt lab draw.

## 2016-11-02 NOTE — ED Triage Notes (Signed)
Patient had treatment on 5/25 and since Tuesday been having weakness, nausea and antiemetics have not been helping that now turned into vomiting.  Patient states that she had several BMS but not loose. Patient also having abd pains.  Patient reports abd pain "is more in my large intestines".

## 2016-11-02 NOTE — Discharge Instructions (Signed)
Continue your nausea medications as before.  Clear liquid diet for the next 12 hours, then slowly advance to crackers, toast, and bland diet.  Return to the emergency department for severe abdominal pain, bloody stools, fevers, or other new and concerning symptoms.

## 2016-11-05 ENCOUNTER — Encounter: Payer: Self-pay | Admitting: Pharmacy Technician

## 2016-11-05 NOTE — Progress Notes (Signed)
The patient has drug assistance approval for Emend, Injectafer, and Neulasta. Coverage varies from the 5/18 - to 5/19.

## 2016-11-07 ENCOUNTER — Other Ambulatory Visit: Payer: Self-pay | Admitting: *Deleted

## 2016-11-07 DIAGNOSIS — C8190 Hodgkin lymphoma, unspecified, unspecified site: Secondary | ICD-10-CM

## 2016-11-08 ENCOUNTER — Ambulatory Visit (HOSPITAL_BASED_OUTPATIENT_CLINIC_OR_DEPARTMENT_OTHER): Payer: Self-pay | Admitting: Hematology

## 2016-11-08 ENCOUNTER — Encounter: Payer: Self-pay | Admitting: Hematology

## 2016-11-08 ENCOUNTER — Other Ambulatory Visit (HOSPITAL_BASED_OUTPATIENT_CLINIC_OR_DEPARTMENT_OTHER): Payer: Self-pay

## 2016-11-08 ENCOUNTER — Telehealth: Payer: Self-pay | Admitting: Hematology

## 2016-11-08 VITALS — BP 128/86 | HR 66 | Temp 98.1°F | Resp 18 | Ht 61.0 in | Wt 165.3 lb

## 2016-11-08 DIAGNOSIS — C8190 Hodgkin lymphoma, unspecified, unspecified site: Secondary | ICD-10-CM

## 2016-11-08 DIAGNOSIS — D509 Iron deficiency anemia, unspecified: Secondary | ICD-10-CM

## 2016-11-08 DIAGNOSIS — C8198 Hodgkin lymphoma, unspecified, lymph nodes of multiple sites: Secondary | ICD-10-CM

## 2016-11-08 DIAGNOSIS — C8194 Hodgkin lymphoma, unspecified, lymph nodes of axilla and upper limb: Secondary | ICD-10-CM

## 2016-11-08 LAB — COMPREHENSIVE METABOLIC PANEL
ALBUMIN: 3.3 g/dL — AB (ref 3.5–5.0)
ALK PHOS: 66 U/L (ref 40–150)
ALT: 12 U/L (ref 0–55)
ANION GAP: 6 meq/L (ref 3–11)
AST: 14 U/L (ref 5–34)
BUN: 7.1 mg/dL (ref 7.0–26.0)
CALCIUM: 9.2 mg/dL (ref 8.4–10.4)
CO2: 27 mEq/L (ref 22–29)
Chloride: 105 mEq/L (ref 98–109)
Creatinine: 0.7 mg/dL (ref 0.6–1.1)
Glucose: 74 mg/dl (ref 70–140)
POTASSIUM: 4.3 meq/L (ref 3.5–5.1)
Sodium: 138 mEq/L (ref 136–145)
Total Bilirubin: <0.22 mg/dL (ref 0.20–1.20)
Total Protein: 6.9 g/dL (ref 6.4–8.3)

## 2016-11-08 LAB — CBC WITH DIFFERENTIAL/PLATELET
BASO%: 1 % (ref 0.0–2.0)
BASOS ABS: 0.1 10*3/uL (ref 0.0–0.1)
EOS ABS: 0.1 10*3/uL (ref 0.0–0.5)
EOS%: 0.9 % (ref 0.0–7.0)
HEMATOCRIT: 35.4 % (ref 34.8–46.6)
HEMOGLOBIN: 11.7 g/dL (ref 11.6–15.9)
LYMPH#: 1.3 10*3/uL (ref 0.9–3.3)
LYMPH%: 14.7 % (ref 14.0–49.7)
MCH: 28.1 pg (ref 25.1–34.0)
MCHC: 33.2 g/dL (ref 31.5–36.0)
MCV: 84.7 fL (ref 79.5–101.0)
MONO#: 0.7 10*3/uL (ref 0.1–0.9)
MONO%: 7.9 % (ref 0.0–14.0)
NEUT#: 6.9 10*3/uL — ABNORMAL HIGH (ref 1.5–6.5)
NEUT%: 75.5 % (ref 38.4–76.8)
PLATELETS: 157 10*3/uL (ref 145–400)
RBC: 4.18 10*6/uL (ref 3.70–5.45)
RDW: 23.8 % — AB (ref 11.2–14.5)
WBC: 9.1 10*3/uL (ref 3.9–10.3)

## 2016-11-08 MED ORDER — HYDROCODONE-ACETAMINOPHEN 5-325 MG PO TABS: 1.0000 | ORAL_TABLET | Freq: Four times a day (QID) | ORAL | 0 refills | Status: DC | PRN

## 2016-11-08 NOTE — Telephone Encounter (Signed)
Gave patient avs report and appointments for June and July.  °

## 2016-11-08 NOTE — Patient Instructions (Signed)
Thank you for choosing [facility] to provide your oncology and hematology care.  To afford each patient quality time with our providers, please arrive 30 minutes before your scheduled appointment time.  If you arrive late for your appointment, you may be asked to reschedule.  We strive to give you quality time with our providers, and arriving late affects you and other patients whose appointments are after yours.   If you are a no show for multiple scheduled visits, you may be dismissed from the clinic at the providers discretion.    Again, thank you for choosing [facility], our hope is that these requests will decrease the amount of time that you wait before being seen by our physicians.  ______________________________________________________________________  Should you have questions after your visit to the [facility], please contact our office at [phone] between the hours of 8:30 and 4:30 p.m.    Voicemails left after 4:30p.m will not be returned until the following business day.    For prescription refill requests, please have your pharmacy contact us directly.  Please also try to allow 48 hours for prescription requests.    Please contact the scheduling department for questions regarding scheduling.  For scheduling of procedures such as PET scans, CT scans, MRI, Ultrasound, etc please contact central scheduling at [phone].    Resources For Cancer Patients and Caregivers:   Oncolink.org:  A wonderful resource for patients and healthcare providers for information regarding your disease, ways to tract your treatment, what to expect, etc.     American Cancer Society:  800-227-2345  Can help patients locate various types of support and financial assistance  Cancer Care: 1-800-813-HOPE (4673) Provides financial assistance, online support groups, medication/co-pay assistance.    [place] DSS:  336-641-3447 Where to apply for food  stamps, Medicaid, and utility assistance  Medicare Rights Center: 800-333-4114 Helps people with Medicare understand their rights and benefits, navigate the Medicare system, and secure the quality healthcare they deserve  SCAT: 336-333-6589 [place] [employer]'s shared-ride transportation service for eligible riders who have a disability that prevents them from riding the fixed route bus.    For additional information on assistance programs please contact our social worker:   Grier Hock/Abigail Elmore:  [phone]            

## 2016-11-09 ENCOUNTER — Telehealth: Payer: Self-pay | Admitting: Hematology

## 2016-11-09 ENCOUNTER — Ambulatory Visit (HOSPITAL_BASED_OUTPATIENT_CLINIC_OR_DEPARTMENT_OTHER): Payer: Self-pay

## 2016-11-09 DIAGNOSIS — Z5189 Encounter for other specified aftercare: Secondary | ICD-10-CM

## 2016-11-09 DIAGNOSIS — C8198 Hodgkin lymphoma, unspecified, lymph nodes of multiple sites: Secondary | ICD-10-CM

## 2016-11-09 DIAGNOSIS — C8194 Hodgkin lymphoma, unspecified, lymph nodes of axilla and upper limb: Secondary | ICD-10-CM

## 2016-11-09 DIAGNOSIS — Z5111 Encounter for antineoplastic chemotherapy: Secondary | ICD-10-CM

## 2016-11-09 MED ORDER — BLEOMYCIN SULFATE CHEMO INJECTION 30 UNIT
10.0000 [IU]/m2 | Freq: Once | INTRAMUSCULAR | Status: AC
  Administered 2016-11-09: 18 [IU] via INTRAVENOUS
  Filled 2016-11-09: qty 6

## 2016-11-09 MED ORDER — PALONOSETRON HCL INJECTION 0.25 MG/5ML
INTRAVENOUS | Status: AC
  Filled 2016-11-09: qty 5

## 2016-11-09 MED ORDER — FOSAPREPITANT DIMEGLUMINE INJECTION 150 MG
Freq: Once | INTRAVENOUS | Status: AC
  Administered 2016-11-09: 11:00:00 via INTRAVENOUS
  Filled 2016-11-09: qty 5

## 2016-11-09 MED ORDER — SODIUM CHLORIDE 0.9 % IV SOLN
375.0000 mg/m2 | Freq: Once | INTRAVENOUS | Status: AC
  Administered 2016-11-09: 660 mg via INTRAVENOUS
  Filled 2016-11-09: qty 33

## 2016-11-09 MED ORDER — ALBUTEROL SULFATE (2.5 MG/3ML) 0.083% IN NEBU
INHALATION_SOLUTION | RESPIRATORY_TRACT | Status: AC
  Filled 2016-11-09: qty 3

## 2016-11-09 MED ORDER — PALONOSETRON HCL INJECTION 0.25 MG/5ML
0.2500 mg | Freq: Once | INTRAVENOUS | Status: AC
Start: 2016-11-09 — End: 2016-11-09
  Administered 2016-11-09: 0.25 mg via INTRAVENOUS

## 2016-11-09 MED ORDER — ALBUTEROL SULFATE (2.5 MG/3ML) 0.083% IN NEBU
2.5000 mg | INHALATION_SOLUTION | Freq: Once | RESPIRATORY_TRACT | Status: AC
  Administered 2016-11-09: 2.5 mg via RESPIRATORY_TRACT
  Filled 2016-11-09: qty 3

## 2016-11-09 MED ORDER — VINBLASTINE SULFATE CHEMO INJECTION 1 MG/ML
10.0000 mg | Freq: Once | INTRAVENOUS | Status: AC
  Administered 2016-11-09: 10 mg via INTRAVENOUS
  Filled 2016-11-09: qty 10

## 2016-11-09 MED ORDER — HEPARIN SOD (PORK) LOCK FLUSH 100 UNIT/ML IV SOLN
500.0000 [IU] | Freq: Once | INTRAVENOUS | Status: AC | PRN
  Administered 2016-11-09: 500 [IU]
  Filled 2016-11-09: qty 5

## 2016-11-09 MED ORDER — SODIUM CHLORIDE 0.9% FLUSH
10.0000 mL | INTRAVENOUS | Status: DC | PRN
  Administered 2016-11-09: 10 mL
  Filled 2016-11-09: qty 10

## 2016-11-09 MED ORDER — SODIUM CHLORIDE 0.9 % IV SOLN
Freq: Once | INTRAVENOUS | Status: AC
  Administered 2016-11-09: 11:00:00 via INTRAVENOUS

## 2016-11-09 MED ORDER — PEGFILGRASTIM 6 MG/0.6ML ~~LOC~~ PSKT
6.0000 mg | PREFILLED_SYRINGE | Freq: Once | SUBCUTANEOUS | Status: AC
  Administered 2016-11-09: 6 mg via SUBCUTANEOUS
  Filled 2016-11-09: qty 0.6

## 2016-11-09 MED ORDER — DOXORUBICIN HCL CHEMO IV INJECTION 2 MG/ML
25.0000 mg/m2 | Freq: Once | INTRAVENOUS | Status: AC
  Administered 2016-11-09: 44 mg via INTRAVENOUS
  Filled 2016-11-09: qty 22

## 2016-11-09 NOTE — Progress Notes (Signed)
Stitch noted at lateral edge of port incision. Pt reports she noticed this yesterday. She denied pain at site. No erythema, edema or drainage noted. Pt instructed to call office if site changes. She voiced understanding. Message to MD to make him aware.

## 2016-11-09 NOTE — Patient Instructions (Signed)
[facility] Discharge Instructions for Patients Receiving Chemotherapy  Today you received the following chemotherapy agents: Adriamycin, Vinblastine, Bleomycin and Dacarbazine.  To help prevent nausea and vomiting after your treatment, we encourage you to take your nausea medication: Compazine. Take one every 6 hours as needed.  For nausea on or after 6/11, take Zofran every 8 hours as needed. If you develop nausea and vomiting that is not controlled by your nausea medication, call the clinic.   BELOW ARE SYMPTOMS THAT SHOULD BE REPORTED IMMEDIATELY:  *FEVER GREATER THAN 100.5 F  *CHILLS WITH OR WITHOUT FEVER  NAUSEA AND VOMITING THAT IS NOT CONTROLLED WITH YOUR NAUSEA MEDICATION  *UNUSUAL SHORTNESS OF BREATH  *UNUSUAL BRUISING OR BLEEDING  TENDERNESS IN MOUTH AND THROAT WITH OR WITHOUT PRESENCE OF ULCERS  *URINARY PROBLEMS  *BOWEL PROBLEMS  UNUSUAL RASH Items with * indicate a potential emergency and should be followed up as soon as possible.  Feel free to call the clinic should you have any questions or concerns. The clinic phone number is [phone].  Please show the [facility] at check-in to the Emergency Department and triage nurse.

## 2016-11-09 NOTE — Telephone Encounter (Signed)
Faxed records to [facility] [phone]

## 2016-11-12 ENCOUNTER — Other Ambulatory Visit: Payer: Self-pay | Admitting: Hematology

## 2016-11-12 NOTE — Progress Notes (Signed)
[name]    HEMATOLOGY/ONCOLOGY CLINIC NOTE  Date of Service: .11/08/2016  Patient Care Team: Brunetta Genera, MD as PCP - General (Hematology) Patient, No Pcp Per (General Practice)  CHIEF COMPLAINTS/PURPOSE OF CONSULTATION:  Newly diagnosed classical Hodgkin's lymphoma  HISTORY OF PRESENTING ILLNESS:   Leah Floyd is a wonderful 26 y.o. female who has been referred to [place] by [employer] [place] for evaluation and management of newly diagnosed classical Hodgkin's lymphoma.  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.  She notes that in November 2017 she developed generalized body aches and flulike symptoms with some subjective fevers chills and night sweats. A primary care physician noted a right axillary lump and she was treated empirically with Z-Pak for possible infection. She notes that around Christmas time in 2017 she developed worsening fatigue and generalized body aches a point that she was having difficulties getting through the day. She notes that she had an autoimmune workup HIV testing which was unrevealing she was noted to have some iron deficiency. She notes that she had a follow-up ultrasound that showed right axillary lymph node was enlarging and she also had a left axillary lymph node and had noted inguinal lymph nodes in the groin. Some of her workup was delayed due to insurance issues that she eventually had a left axillary lymph node biopsy on 09/24/2016 accession number [id number] which showed classical Hodgkin's lymphoma.  Patient then has moved back from [place] to be with her mother and family in [place] not, and is here to seek further cares for her classical Hodgkin's lymphoma newly diagnosed.  Patient notes significant fatigue, night sweats, weight loss of about 40  pounds in the last 4-5 months, generalized body aches, dry cough and loss of appetite.  She notes that she previously had heavy menstrual periods lasting 7 days which are now become lighter and last about 4 days. She has been taking ferrous sulfate plus vitamin C for her iron deficiency anemia.  No previous heart problems. Has been having some mild diarrhea which she describes as soft stools 2-3 times a day.  INTERVAL HISTORY  Leah Floyd is here with a friend for a toxicity and f/u prior to her next dose of treatment. She was in the ED briefly for generalized body aches likely due to her Neulasta. Notes that she is feeling better overall. Some fatigue. Hemoglobin is improved. Notes she has fairly vivid dreams likely due to steroids and her other chemotherapy medications. We discussed some of the pros and cons of switching her bleomycin to Brentuximab and she prefer this considering she baseline issues with Asthma. No fevers no chills or night sweats. No other pulmonary toxicities.   MEDICAL HISTORY:   #1 iron deficiency anemia #2 GERD #3 obesity. #4 pseudotumor cerebri however this has resolved with her weight loss as per patient. #5 asthma #6 of newly diagnosed Hodgkin's lymphoma #7 shellfish allergy #8 history of Chlamydia infection   SURGICAL HISTORY: Past Surgical History:  Procedure Laterality Date  . ANTERIOR CRUCIATE LIGAMENT REPAIR     left  . HERNIA REPAIR     left  . IR FLUORO GUIDE PORT INSERTION RIGHT  10/04/2016  . IR US GUIDE VASC ACCESS RIGHT  10/04/2016    SOCIAL HISTORY: Social History   Social History  . Marital status: Single    Spouse name:  N/A  . Number of children: N/A  . Years of education: N/A   Occupational History  . Not on file.   Social History Main Topics  . Smoking status: Never Smoker  . Smokeless tobacco: Never Used  . Alcohol use Yes     Comment: socially  . Drug use: Yes    Types: Marijuana     Comment: medical   . Sexual activity:  Not on file   Other Topics Concern  . Not on file   Social History Narrative  . No narrative on file    FAMILY HISTORY: Notes her first cousin on her mom's side had some form off rare bone marrow cancer Does not know history on her father's side of the family Maternal grandmother-hypertension, diabetes, fibromyalgia Mother anemia Other relatives on her mom's side with lymphoma and bladder cancer.  ALLERGIES:  is allergic to shellfish-derived products.  MEDICATIONS:  Current Outpatient Prescriptions  Medication Sig Dispense Refill  . albuterol (PROVENTIL HFA;VENTOLIN HFA) 108 (90 Base) MCG/ACT inhaler Inhale 2 puffs into the lungs every 6 (six) hours as needed for wheezing or shortness of breath. 1 Inhaler 2  . Ascorbic Acid (VITAMIN C PO) Take by mouth.    . Cyanocobalamin (VITAMIN B-12 CR PO) Take 1 tablet by mouth daily.    [name] dexamethasone (DECADRON) 4 MG tablet Take 2 tablets by mouth once a day on the day after chemotherapy and then take 2 tablets two times a day for 2 days. Take with food. 30 tablet 1  . diphenhydramine-acetaminophen (TYLENOL PM) 25-500 MG TABS tablet Take 2 tablets by mouth at bedtime as needed.    [name] HYDROcodone-acetaminophen (NORCO/VICODIN) 5-325 MG tablet Take 1-2 tablets by mouth every 6 (six) hours as needed for moderate pain or severe pain. 30 tablet 0  . lidocaine-prilocaine (EMLA) cream Apply to affected area once 30 g 3  . loratadine (CLARITIN) 10 MG tablet Take 10 mg by mouth daily.    [name] LORazepam (ATIVAN) 0.5 MG tablet Take 1 tablet (0.5 mg total) by mouth every 6 (six) hours as needed (Nausea or vomiting). 30 tablet 0  . magic mouthwash w/lidocaine SOLN Take 5 mLs by mouth 4 (four) times daily as needed for mouth pain. 120 mL 0  . omeprazole (PRILOSEC) 20 MG capsule Take 20 mg by mouth daily.    . ondansetron (ZOFRAN) 8 MG tablet Take 1 tablet (8 mg total) by mouth 2 (two) times daily as needed. Start on the third day after chemotherapy. 30 tablet 1    . PEGFILGRASTIM Dike Apply 1 patch topically once.    . prochlorperazine (COMPAZINE) 10 MG tablet Take 1 tablet (10 mg total) by mouth every 6 (six) hours as needed (Nausea or vomiting). 30 tablet 1  . senna-docusate (SENNA S) 8.6-50 MG tablet Take 2 tablets by mouth at bedtime. Hold if diarrhea ensues 60 tablet 1  . EPINEPHrine 0.3 mg/0.3 mL IJ SOAJ injection Inject 0.3 mLs (0.3 mg total) into the muscle once. For anaphylactic reactions 1 Device 0   No current facility-administered medications for this visit.     REVIEW OF SYSTEMS:    10 Point review of Systems was done is negative except as noted above.  PHYSICAL EXAMINATION: ECOG PERFORMANCE STATUS: 1 - Symptomatic but completely ambulatory  . Vitals:   11/08/16 1217  BP: 128/86  Pulse: 66  Resp: 18  Temp: 98.1 F (36.7 C)   Filed Weights   11/08/16 1217  Weight: 165 lb 4.8 oz (  75 kg)   .Body mass index is 31.23 kg/m.  GENERAL:alert, in no acute distress and comfortable SKIN: no acute rashes, no significant lesions EYES: conjunctiva are pink and non-injected, sclera anicteric OROPHARYNX: MMM, no exudates, no oropharyngeal erythema or ulceration NECK: supple, no JVD LYMPH:  Patient has palpable lymphadenopathy bilaterally in her neck , occipital area, supraclavicular, bilateral axillary and inguinal areas  LUNGS: clear to auscultation b/l with normal respiratory effort HEART: regular rate & rhythm ABDOMEN:  normoactive bowel sounds , non tender, not distended. Borderline palpable splenomegaly, no palpable hepatomegaly. Extremity: no pedal edema PSYCH: alert & oriented x 3 with fluent speech NEURO: no focal motor/sensory deficits  LABORATORY DATA:  I have reviewed the data as listed  . CBC Latest Ref Rng & Units 11/08/2016 11/02/2016 10/26/2016  WBC 3.9 - 10.3 10e3/uL 9.1 16.8(H) 2.1(L)  Hemoglobin 11.6 - 15.9 g/dL 11.7 12.2 10.2(L)  Hematocrit 34.8 - 46.6 % 35.4 36.5 31.0(L)  Platelets 145 - 400 10e3/uL 157 219 269    . CBC    Component Value Date/Time   WBC 9.1 11/08/2016 1150   WBC 16.8 (H) 11/02/2016 1030   RBC 4.18 11/08/2016 1150   RBC 4.44 11/02/2016 1030   HGB 11.7 11/08/2016 1150   HCT 35.4 11/08/2016 1150   PLT 157 11/08/2016 1150   MCV 84.7 11/08/2016 1150   MCH 28.1 11/08/2016 1150   MCH 27.5 11/02/2016 1030   MCHC 33.2 11/08/2016 1150   MCHC 33.4 11/02/2016 1030   RDW 23.8 (H) 11/08/2016 1150   LYMPHSABS 1.3 11/08/2016 1150   MONOABS 0.7 11/08/2016 1150   EOSABS 0.1 11/08/2016 1150   BASOSABS 0.1 11/08/2016 1150    . CMP Latest Ref Rng & Units 11/08/2016 11/02/2016 10/26/2016  Glucose 70 - 140 mg/dl 74 94 93  BUN 7.0 - 26.0 mg/dL 7.1 6 3.1(L)  Creatinine 0.6 - 1.1 mg/dL 0.7 0.58 0.6  Sodium 136 - 145 mEq/L 138 134(L) 138  Potassium 3.5 - 5.1 mEq/L 4.3 4.2 3.8  Chloride 101 - 111 mmol/L - 105 -  CO2 22 - 29 mEq/L 27 25 26   Calcium 8.4 - 10.4 mg/dL 9.2 8.4(L) 8.3(L)  Total Protein 6.4 - 8.3 g/dL 6.9 6.6 6.8  Total Bilirubin 0.20 - 1.20 mg/dL <0.22 0.2(L) <0.22  Alkaline Phos 40 - 150 U/L 66 71 49  AST 5 - 34 U/L 14 22 18   ALT 0 - 55 U/L 12 16 15    . Lab Results  Component Value Date   LDH 194 10/02/2016   . Lab Results  Component Value Date   IRON 26 (L) 10/02/2016   TIBC 349 10/02/2016   IRONPCTSAT 7 (L) 10/02/2016   (Iron and TIBC)  Lab Results  Component Value Date   FERRITIN 10 10/02/2016      RADIOGRAPHIC STUDIES: I have personally reviewed the radiological images as listed and agreed with the findings in the report. No results found.  ASSESSMENT & PLAN:   26 year old female with   #1 Classical Hodgkin's lymphoma Stage IVB as per PET/CT Patient has constitutional symptoms with significant weight loss of 40 pounds, and night sweats and debilitating fatigue. PLAN -Patient is here for Scheduled follow-up prior to cycle 2 day 1 of treatment. She had some neutropenia with cycle 1 day 15 and was started on Neulasta. -After discussion with her today we  shall switch her Bleomycin to Brentuximab as per the Echelon-1 trial. - We'll continue the same supportive medications and continue Neulasta -I shall  see her back in 2 weeks with cycle 2 day 15 of treatment. -Counseled on importance for infection prevention.  #2 history of iron deficiency anemia Has been on oral iron that causes significant constipation and is still significantly anemic. S/p IV Injectafer 750mg  po x 1 dose today. hgb has improved.  #3 history of asthma. This has been mild but tends to get worse in [place]. 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 Patient notes that she tolerates eating shrimp. -Prescribed EpiPen for when necessary use. -Recommended also to carry Benadryl.  #5 general medical cares -Patient was recommended to establish a primary care physician as soon as possible.  Continue treatment as per plan RTC with Dr Irene Limbo in 2 weeks with C2D15 with labs Continue labs q2weeks  I spent 20 minutes counseling the patient face to face. The total time spent in the appointment was 25 minutes and more than 50% was on counseling and direct patient cares.    Sullivan Lone MD [place] AAHIVMS [facility] Hematology/Oncology Physician [facility]  (Office):       [phone] (Work cell):  [phone] (Fax):           [phone]

## 2016-11-21 ENCOUNTER — Other Ambulatory Visit: Payer: Self-pay | Admitting: *Deleted

## 2016-11-21 DIAGNOSIS — C8190 Hodgkin lymphoma, unspecified, unspecified site: Secondary | ICD-10-CM

## 2016-11-22 ENCOUNTER — Ambulatory Visit (HOSPITAL_BASED_OUTPATIENT_CLINIC_OR_DEPARTMENT_OTHER): Payer: Self-pay | Admitting: Hematology

## 2016-11-22 ENCOUNTER — Other Ambulatory Visit (HOSPITAL_BASED_OUTPATIENT_CLINIC_OR_DEPARTMENT_OTHER): Payer: Self-pay

## 2016-11-22 DIAGNOSIS — D509 Iron deficiency anemia, unspecified: Secondary | ICD-10-CM

## 2016-11-22 DIAGNOSIS — C8190 Hodgkin lymphoma, unspecified, unspecified site: Secondary | ICD-10-CM

## 2016-11-22 DIAGNOSIS — C8194 Hodgkin lymphoma, unspecified, lymph nodes of axilla and upper limb: Secondary | ICD-10-CM

## 2016-11-22 DIAGNOSIS — C8198 Hodgkin lymphoma, unspecified, lymph nodes of multiple sites: Secondary | ICD-10-CM

## 2016-11-22 LAB — COMPREHENSIVE METABOLIC PANEL
ALT: 17 U/L (ref 0–55)
AST: 14 U/L (ref 5–34)
Albumin: 3.1 g/dL — ABNORMAL LOW (ref 3.5–5.0)
Alkaline Phosphatase: 67 U/L (ref 40–150)
Anion Gap: 9 mEq/L (ref 3–11)
BUN: 5.3 mg/dL — ABNORMAL LOW (ref 7.0–26.0)
CHLORIDE: 105 meq/L (ref 98–109)
CO2: 25 meq/L (ref 22–29)
CREATININE: 0.7 mg/dL (ref 0.6–1.1)
Calcium: 9.3 mg/dL (ref 8.4–10.4)
EGFR: >90 mL/min/{1.73_m2} (ref >90)
GLUCOSE: 88 mg/dL (ref 70–140)
Potassium: 3.5 mEq/L (ref 3.5–5.1)
SODIUM: 139 meq/L (ref 136–145)
TOTAL PROTEIN: 6.5 g/dL (ref 6.4–8.3)

## 2016-11-22 LAB — CBC WITH DIFFERENTIAL/PLATELET
BASO%: 0.5 % (ref 0.0–2.0)
Basophils Absolute: 0 10*3/uL (ref 0.0–0.1)
EOS%: 3.7 % (ref 0.0–7.0)
Eosinophils Absolute: 0.3 10*3/uL (ref 0.0–0.5)
HCT: 34.9 % (ref 34.8–46.6)
HGB: 11.8 g/dL (ref 11.6–15.9)
LYMPH%: 12.1 % — AB (ref 14.0–49.7)
MCH: 29.1 pg (ref 25.1–34.0)
MCHC: 33.7 g/dL (ref 31.5–36.0)
MCV: 86.4 fL (ref 79.5–101.0)
MONO#: 0.5 10*3/uL (ref 0.1–0.9)
MONO%: 6.5 % (ref 0.0–14.0)
NEUT%: 77.2 % — AB (ref 38.4–76.8)
NEUTROS ABS: 5.6 10*3/uL (ref 1.5–6.5)
Platelets: 229 10*3/uL (ref 145–400)
RBC: 4.04 10*6/uL (ref 3.70–5.45)
RDW: 23.9 % — ABNORMAL HIGH (ref 11.2–14.5)
WBC: 7.3 10*3/uL (ref 3.9–10.3)
lymph#: 0.9 10*3/uL (ref 0.9–3.3)

## 2016-11-22 MED ORDER — LORAZEPAM 0.5 MG PO TABS: 0.5000 mg | ORAL_TABLET | Freq: Four times a day (QID) | ORAL | 0 refills | Status: DC | PRN

## 2016-11-22 MED ORDER — SENNOSIDES-DOCUSATE SODIUM 8.6-50 MG PO TABS: 2.0000 | ORAL_TABLET | Freq: Every day | ORAL | 1 refills | Status: DC

## 2016-11-22 MED ORDER — PROCHLORPERAZINE MALEATE 10 MG PO TABS: 10.0000 mg | ORAL_TABLET | Freq: Four times a day (QID) | ORAL | 1 refills | Status: DC | PRN

## 2016-11-22 MED FILL — PROCHLORPERAZINE 10 MG TAB: 10 | 8 days supply | Qty: 30 | Fill #0

## 2016-11-22 MED FILL — SENNA PLUS 8.6-50 MG TAB: 8.6-50 | 30 days supply | Qty: 60 | Fill #0

## 2016-11-22 NOTE — Patient Instructions (Signed)
Thank you for choosing [facility] to provide your oncology and hematology care.  To afford each patient quality time with our providers, please arrive 30 minutes before your scheduled appointment time.  If you arrive late for your appointment, you may be asked to reschedule.  We strive to give you quality time with our providers, and arriving late affects you and other patients whose appointments are after yours.   If you are a no show for multiple scheduled visits, you may be dismissed from the clinic at the providers discretion.    Again, thank you for choosing [facility], our hope is that these requests will decrease the amount of time that you wait before being seen by our physicians.  ______________________________________________________________________  Should you have questions after your visit to the [facility], please contact our office at [phone] between the hours of 8:30 and 4:30 p.m.    Voicemails left after 4:30p.m will not be returned until the following business day.    For prescription refill requests, please have your pharmacy contact us directly.  Please also try to allow 48 hours for prescription requests.    Please contact the scheduling department for questions regarding scheduling.  For scheduling of procedures such as PET scans, CT scans, MRI, Ultrasound, etc please contact central scheduling at [phone].    Resources For Cancer Patients and Caregivers:   Oncolink.org:  A wonderful resource for patients and healthcare providers for information regarding your disease, ways to tract your treatment, what to expect, etc.     American Cancer Society:  800-227-2345  Can help patients locate various types of support and financial assistance  Cancer Care: 1-800-813-HOPE (4673) Provides financial assistance, online support groups, medication/co-pay assistance.    [place] DSS:  336-641-3447 Where to apply for food  stamps, Medicaid, and utility assistance  Medicare Rights Center: 800-333-4114 Helps people with Medicare understand their rights and benefits, navigate the Medicare system, and secure the quality healthcare they deserve  SCAT: 336-333-6589 [employer]'s shared-ride transportation service for eligible riders who have a disability that prevents them from riding the fixed route bus.    For additional information on assistance programs please contact our social worker:   Grier Hock/Abigail Elmore:  [phone]            

## 2016-11-23 ENCOUNTER — Telehealth: Payer: Self-pay | Admitting: Hematology

## 2016-11-23 ENCOUNTER — Ambulatory Visit (HOSPITAL_BASED_OUTPATIENT_CLINIC_OR_DEPARTMENT_OTHER): Payer: Self-pay

## 2016-11-23 ENCOUNTER — Encounter: Payer: Self-pay | Admitting: Pharmacist

## 2016-11-23 VITALS — BP 118/88 | HR 71 | Temp 98.7°F | Resp 16

## 2016-11-23 DIAGNOSIS — C8198 Hodgkin lymphoma, unspecified, lymph nodes of multiple sites: Secondary | ICD-10-CM

## 2016-11-23 DIAGNOSIS — Z5111 Encounter for antineoplastic chemotherapy: Secondary | ICD-10-CM

## 2016-11-23 DIAGNOSIS — C8194 Hodgkin lymphoma, unspecified, lymph nodes of axilla and upper limb: Secondary | ICD-10-CM

## 2016-11-23 DIAGNOSIS — Z5189 Encounter for other specified aftercare: Secondary | ICD-10-CM

## 2016-11-23 DIAGNOSIS — Z5112 Encounter for antineoplastic immunotherapy: Secondary | ICD-10-CM

## 2016-11-23 MED ORDER — PEGFILGRASTIM 6 MG/0.6ML ~~LOC~~ PSKT
6.0000 mg | PREFILLED_SYRINGE | Freq: Once | SUBCUTANEOUS | Status: AC
  Administered 2016-11-23: 6 mg via SUBCUTANEOUS
  Filled 2016-11-23: qty 0.6

## 2016-11-23 MED ORDER — SODIUM CHLORIDE 0.9 % IV SOLN
5.6000 mg/m2 | Freq: Once | INTRAVENOUS | Status: AC
  Administered 2016-11-23: 10 mg via INTRAVENOUS
  Filled 2016-11-23: qty 10

## 2016-11-23 MED ORDER — PALONOSETRON HCL INJECTION 0.25 MG/5ML
0.2500 mg | Freq: Once | INTRAVENOUS | Status: AC
  Administered 2016-11-23: 0.25 mg via INTRAVENOUS

## 2016-11-23 MED ORDER — FOSAPREPITANT DIMEGLUMINE INJECTION 150 MG
Freq: Once | INTRAVENOUS | Status: AC
  Administered 2016-11-23: 11:00:00 via INTRAVENOUS
  Filled 2016-11-23: qty 5

## 2016-11-23 MED ORDER — DOXORUBICIN HCL CHEMO IV INJECTION 2 MG/ML
25.0000 mg/m2 | Freq: Once | INTRAVENOUS | Status: AC
  Administered 2016-11-23: 44 mg via INTRAVENOUS
  Filled 2016-11-23: qty 22

## 2016-11-23 MED ORDER — SODIUM CHLORIDE 0.9 % IV SOLN
375.0000 mg/m2 | Freq: Once | INTRAVENOUS | Status: AC
  Administered 2016-11-23: 660 mg via INTRAVENOUS
  Filled 2016-11-23: qty 33

## 2016-11-23 MED ORDER — SODIUM CHLORIDE 0.9% FLUSH
10.0000 mL | INTRAVENOUS | Status: DC | PRN
  Administered 2016-11-23: 10 mL
  Filled 2016-11-23: qty 10

## 2016-11-23 MED ORDER — ALBUTEROL SULFATE (2.5 MG/3ML) 0.083% IN NEBU
2.5000 mg | INHALATION_SOLUTION | Freq: Once | RESPIRATORY_TRACT | Status: DC
  Filled 2016-11-23: qty 3

## 2016-11-23 MED ORDER — PALONOSETRON HCL INJECTION 0.25 MG/5ML
INTRAVENOUS | Status: AC
  Filled 2016-11-23: qty 5

## 2016-11-23 MED ORDER — HEPARIN SOD (PORK) LOCK FLUSH 100 UNIT/ML IV SOLN
500.0000 [IU] | Freq: Once | INTRAVENOUS | Status: AC | PRN
  Administered 2016-11-23: 500 [IU]
  Filled 2016-11-23: qty 5

## 2016-11-23 MED ORDER — SODIUM CHLORIDE 0.9 % IV SOLN
Freq: Once | INTRAVENOUS | Status: AC
  Administered 2016-11-23: 11:00:00 via INTRAVENOUS

## 2016-11-23 MED ORDER — SODIUM CHLORIDE 0.9 % IV SOLN
1.2000 mg/kg | Freq: Once | INTRAVENOUS | Status: AC
  Administered 2016-11-23: 90 mg via INTRAVENOUS
  Filled 2016-11-23: qty 18

## 2016-11-23 NOTE — Telephone Encounter (Signed)
Scheduled appt per 6/21 los - patient by passed scheduling - Rn in treatment area to print out schedule for patient.

## 2016-11-23 NOTE — Patient Instructions (Addendum)
[facility] Discharge Instructions for Patients Receiving Chemotherapy  Today you received the following chemotherapy agents adriamycin/adcetris/vincristine/dacarbazine   To help prevent nausea and vomiting after your treatment, we encourage you to take your nausea medication as directed  If you develop nausea and vomiting that is not controlled by your nausea medication, call the clinic.   BELOW ARE SYMPTOMS THAT SHOULD BE REPORTED IMMEDIATELY:  *FEVER GREATER THAN 100.5 F  *CHILLS WITH OR WITHOUT FEVER  NAUSEA AND VOMITING THAT IS NOT CONTROLLED WITH YOUR NAUSEA MEDICATION  *UNUSUAL SHORTNESS OF BREATH  *UNUSUAL BRUISING OR BLEEDING  TENDERNESS IN MOUTH AND THROAT WITH OR WITHOUT PRESENCE OF ULCERS  *URINARY PROBLEMS  *BOWEL PROBLEMS  UNUSUAL RASH Items with * indicate a potential emergency and should be followed up as soon as possible.  Feel free to call the clinic you have any questions or concerns. The clinic phone number is [phone].   Brentuximab vedotin solution for injection What is this medicine? BRENTUXIMAB VEDOTIN (bren TUX see mab ve DOE tin) is a monoclonal antibody and a chemotherapy drug. It is used for treating Hodgkin lymphoma and certain non-Hodgkin lymphomas, such as anaplastic large-cell lymphoma and mycosis fungoides. This medicine may be used for other purposes; ask your health care provider or pharmacist if you have questions. COMMON BRAND NAME(S): ADCETRIS What should I tell my health care provider before I take this medicine? They need to know if you have any of these conditions: -immune system problems -infection (especially a virus infection such as chickenpox, cold sores, or herpes) -kidney disease -liver disease -low blood counts, like low white cell, platelet, or red cell counts -tingling of the fingers or toes, or other nerve disorder -an unusual or allergic reaction to brentuximab vedotin, other medicines, foods, dyes,  or preservatives -pregnant or trying to get pregnant -breast-feeding How should I use this medicine? This medicine is for infusion into a vein. It is given by a health care professional in a hospital or clinic setting. Talk to your pediatrician regarding the use of this medicine in children. Special care may be needed. Overdosage: If you think you have taken too much of this medicine contact a poison control center or emergency room at once. NOTE: This medicine is only for you. Do not share this medicine with others. What if I miss a dose? It is important not to miss your dose. Call your doctor or health care professional if you are unable to keep an appointment. What may interact with this medicine? This medicine may interact with the following medications: -ketoconazole -rifampin -St. John's wort; Hypericum perforatum This list may not describe all possible interactions. Give your health care provider a list of all the medicines, herbs, non-prescription drugs, or dietary supplements you use. Also tell them if you smoke, drink alcohol, or use illegal drugs. Some items may interact with your medicine. What should I watch for while using this medicine? Visit your doctor for checks on your progress. This drug may make you feel generally unwell. Report any side effects. Continue your course of treatment even though you feel ill unless your doctor tells you to stop. Call your doctor or health care professional for advice if you get a fever, chills or sore throat, or other symptoms of a cold or flu. Do not treat yourself. This drug decreases your body's ability to fight infections. Try to avoid being around people who are sick. This medicine may increase your risk to bruise or bleed. Call your doctor  or health care professional if you notice any unusual bleeding. In some patients, this medicine may cause a serious brain infection that may cause death. If you have any problems seeing, thinking,  speaking, walking, or standing, tell your doctor right away. If you cannot reach your doctor, urgently seek other source of medical care. Do not become pregnant while taking this medicine or for 6 months after stopping it. Women should inform their doctor if they wish to become pregnant or think they might be pregnant. Men should not father a child while taking this medicine and for 6 months after stopping it. There is a potential for serious side effects to an unborn child. Talk to your health care professional or pharmacist for more information. Do not breast-feed an infant while taking this medicine. This may interfere with the ability to father a child. You should talk to your doctor or health care professional if you are concerned about your fertility. What side effects may I notice from receiving this medicine? Side effects that you should report to your doctor or health care professional as soon as possible: -allergic reactions like skin rash, itching or hives, swelling of the face, lips, or tongue -changes in emotions or moods -diarrhea -low blood counts - this medicine may decrease the number of white blood cells, red blood cells and platelets. You may be at increased risk for infections and bleeding. -pain, tingling, numbness in the hands or feet -redness, blistering, peeling or loosening of the skin, including inside the mouth -shortness of breath -signs of infection - fever or chills, cough, sore throat, pain or difficulty passing urine -signs of decreased platelets or bleeding - bruising, pinpoint red spots on the skin, black, tarry stools, blood in the urine -signs of decreased red blood cells - unusually weak or tired, fainting spells, lightheadedness -signs of liver injury like dark yellow or brown urine; general ill feeling or flu-like symptoms; light-colored stools; loss of appetite; nausea; right upper belly pain; yellowing of the eyes or skin -stomach pain -sudden numbness or  weakness of the face, arm or leg -vomiting Side effects that usually do not require medical attention (report to your doctor or health care professional if they continue or are bothersome): -dizziness -headache -muscle pain -tiredness This list may not describe all possible side effects. Call your doctor for medical advice about side effects. You may report side effects to FDA at 1-800-FDA-1088. Where should I keep my medicine? This drug is given in a hospital or clinic and will not be stored at home. NOTE: This sheet is a summary. It may not cover all possible information. If you have questions about this medicine, talk to your doctor, pharmacist, or health care provider.  2018 Elsevier/Gold Standard (2016-04-13 18:06:58)

## 2016-11-27 ENCOUNTER — Telehealth: Payer: Self-pay

## 2016-11-27 ENCOUNTER — Other Ambulatory Visit: Payer: Self-pay

## 2016-11-27 MED ORDER — MAGIC MOUTHWASH W/LIDOCAINE: 5.0000 mL | Freq: Four times a day (QID) | ORAL | 0 refills | Status: DC | PRN

## 2016-11-27 MED FILL — LORazepam 0.5 MG TABS: 0.5 | 7 days supply | Qty: 30 | Fill #0

## 2016-11-27 MED FILL — CMPD-MMW:MYLANTA,LIDO,NYST: 6 days supply | Qty: 120 | Fill #0

## 2016-11-27 NOTE — Telephone Encounter (Signed)
Pt called for refill on MMW, it is keeping thrush in check in each round of chemo. Filled per protocol

## 2016-12-02 NOTE — Progress Notes (Signed)
[name]    HEMATOLOGY/ONCOLOGY CLINIC NOTE  Date of Service: .11/22/2016  Patient Care Team: Brunetta Genera, MD as PCP - General (Hematology) Patient, No Pcp Per (General Practice)  CHIEF COMPLAINTS/PURPOSE OF CONSULTATION:  Newly diagnosed classical Hodgkin's lymphoma  HISTORY OF PRESENTING ILLNESS:   Leah Floyd is a wonderful 26 y.o. female who has been referred to [place] by [employer] [facility] [place] for evaluation and management of newly diagnosed classical Hodgkin's lymphoma.  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  She notes that in November 2017 she developed generalized body aches and flulike symptoms with some subjective fevers chills and night sweats. A primary care physician noted a right axillary lump and she was treated empirically with Z-Pak for possible infection. She notes that around Christmas time in 2017 she developed worsening fatigue and generalized body aches a point that she was having difficulties getting through the day. She notes that she had an autoimmune workup HIV testing which was unrevealing she was noted to have some iron deficiency. She notes that she had a follow-up ultrasound that showed right axillary lymph node was enlarging and she also had a left axillary lymph node and had noted inguinal lymph nodes in the groin. Some of her workup was delayed due to insurance issues that she eventually had a left axillary lymph node biopsy on 09/24/2016 accession number [id number] which showed classical Hodgkin's lymphoma.  Patient then has moved back from [place] to be with her mother and family in [place] not, and is here to seek further cares for her classical Hodgkin's lymphoma newly diagnosed.  Patient notes significant fatigue, night sweats, weight loss of about 40  pounds in the last 4-5 months, generalized body aches, dry cough and loss of appetite.  She notes that she previously had heavy menstrual periods lasting 7 days which are now become lighter and last about 4 days. She has been taking ferrous sulfate plus vitamin C for her iron deficiency anemia.  No previous heart problems. Has been having some mild diarrhea which she describes as soft stools 2-3 times a day.  INTERVAL HISTORY  Ms Pierro is here with a friend for a followup prior to C3 of her chemotherapy. She has tolerated the switch from Bleomycin to Bretuximab without any acute new toxicities. Some bodyaches from neulasta. No fevers/chills/night sweats. No symptoms suggestive of neuropathy.   MEDICAL HISTORY:   #1 iron deficiency anemia #2 GERD #3 obesity. #4 pseudotumor cerebri however this has resolved with her weight loss as per patient. #5 asthma #6 of newly diagnosed Hodgkin's lymphoma #7 shellfish allergy #8 history of Chlamydia infection   SURGICAL HISTORY: Past Surgical History:  Procedure Laterality Date  . ANTERIOR CRUCIATE LIGAMENT REPAIR     left  . HERNIA REPAIR     left  . IR FLUORO GUIDE PORT INSERTION RIGHT  10/04/2016  . IR US GUIDE VASC ACCESS RIGHT  10/04/2016    SOCIAL HISTORY: Social History   Social History  . Marital status: Single    Spouse name: N/A  . Number of children: N/A  . Years of education: N/A   Occupational History  . Not on file.   Social History Main Topics  . Smoking status: Never Smoker  . Smokeless tobacco: Never Used  . Alcohol use Yes     Comment: socially  . Drug  use: Yes    Types: Marijuana     Comment: medical   . Sexual activity: Not on file   Other Topics Concern  . Not on file   Social History Narrative  . No narrative on file    FAMILY HISTORY: Notes her first cousin on her mom's side had some form off rare bone marrow cancer Does not know history on her father's side of the family Maternal  grandmother-hypertension, diabetes, fibromyalgia Mother anemia Other relatives on her mom's side with lymphoma and bladder cancer.  ALLERGIES:  is allergic to shellfish-derived products.  MEDICATIONS:  Current Outpatient Prescriptions  Medication Sig Dispense Refill  . albuterol (PROVENTIL HFA;VENTOLIN HFA) 108 (90 Base) MCG/ACT inhaler Inhale 2 puffs into the lungs every 6 (six) hours as needed for wheezing or shortness of breath. 1 Inhaler 2  . Ascorbic Acid (VITAMIN C PO) Take by mouth.    . Cyanocobalamin (VITAMIN B-12 CR PO) Take 1 tablet by mouth daily.    [name] dexamethasone (DECADRON) 4 MG tablet Take 2 tablets by mouth once a day on the day after chemotherapy and then take 2 tablets two times a day for 2 days. Take with food. 30 tablet 1  . diphenhydramine-acetaminophen (TYLENOL PM) 25-500 MG TABS tablet Take 2 tablets by mouth at bedtime as needed.    [name] HYDROcodone-acetaminophen (NORCO/VICODIN) 5-325 MG tablet Take 1-2 tablets by mouth every 6 (six) hours as needed for moderate pain or severe pain. 30 tablet 0  . lidocaine-prilocaine (EMLA) cream Apply to affected area once 30 g 3  . loratadine (CLARITIN) 10 MG tablet Take 10 mg by mouth daily.    [name] LORazepam (ATIVAN) 0.5 MG tablet Take 1 tablet (0.5 mg total) by mouth every 6 (six) hours as needed (Nausea or vomiting). 30 tablet 0  . omeprazole (PRILOSEC) 20 MG capsule Take 20 mg by mouth daily.    . ondansetron (ZOFRAN) 8 MG tablet Take 1 tablet (8 mg total) by mouth 2 (two) times daily as needed. Start on the third day after chemotherapy. 30 tablet 1  . PEGFILGRASTIM Dalzell Apply 1 patch topically once.    [name] EPINEPHrine 0.3 mg/0.3 mL IJ SOAJ injection Inject 0.3 mLs (0.3 mg total) into the muscle once. For anaphylactic reactions 1 Device 0  . LORazepam (ATIVAN) 0.5 MG tablet Take 1 tablet (0.5 mg total) by mouth every 6 (six) hours as needed (Nausea or vomiting). 30 tablet 0  . magic mouthwash w/lidocaine SOLN Take 5 mLs by mouth 4 (four)  times daily as needed for mouth pain. 120 mL 0  . prochlorperazine (COMPAZINE) 10 MG tablet Take 1 tablet (10 mg total) by mouth every 6 (six) hours as needed (Nausea or vomiting). 30 tablet 1  . senna-docusate (SENNA S) 8.6-50 MG tablet Take 2 tablets by mouth at bedtime. Hold if diarrhea ensues 60 tablet 1   No current facility-administered medications for this visit.     REVIEW OF SYSTEMS:    10 Point review of Systems was done is negative except as noted above.  PHYSICAL EXAMINATION: ECOG PERFORMANCE STATUS: 1 - Symptomatic but completely ambulatory  . Vitals:   11/22/16 1351  BP: 122/82  Pulse: 95  Resp: 20  Temp: 98.5 F (36.9 C)   Filed Weights   11/22/16 1351  Weight: 169 lb 6.4 oz (76.8 kg)   .Body mass index is 32.01 kg/m.  GENERAL:alert, in no acute distress and comfortable SKIN: no acute rashes, no significant lesions EYES: conjunctiva are  pink and non-injected, sclera anicteric OROPHARYNX: MMM, no exudates, no oropharyngeal erythema or ulceration NECK: supple, no JVD LYMPH:  Patient has palpable lymphadenopathy bilaterally in her neck , occipital area, supraclavicular, bilateral axillary and inguinal areas  LUNGS: clear to auscultation b/l with normal respiratory effort HEART: regular rate & rhythm ABDOMEN:  normoactive bowel sounds , non tender, not distended. Borderline palpable splenomegaly, no palpable hepatomegaly. Extremity: no pedal edema PSYCH: alert & oriented x 3 with fluent speech NEURO: no focal motor/sensory deficits  LABORATORY DATA:  I have reviewed the data as listed  . CBC Latest Ref Rng & Units 11/22/2016 11/08/2016 11/02/2016  WBC 3.9 - 10.3 10e3/uL 7.3 9.1 16.8(H)  Hemoglobin 11.6 - 15.9 g/dL 11.8 11.7 12.2  Hematocrit 34.8 - 46.6 % 34.9 35.4 36.5  Platelets 145 - 400 10e3/uL 229 157 219   ANC 5.6k . CBC    Component Value Date/Time   WBC 7.3 11/22/2016 1338   WBC 16.8 (H) 11/02/2016 1030   RBC 4.04 11/22/2016 1338   RBC 4.44  11/02/2016 1030   HGB 11.8 11/22/2016 1338   HCT 34.9 11/22/2016 1338   PLT 229 11/22/2016 1338   MCV 86.4 11/22/2016 1338   MCH 29.1 11/22/2016 1338   MCH 27.5 11/02/2016 1030   MCHC 33.7 11/22/2016 1338   MCHC 33.4 11/02/2016 1030   RDW 23.9 (H) 11/22/2016 1338   LYMPHSABS 0.9 11/22/2016 1338   MONOABS 0.5 11/22/2016 1338   EOSABS 0.3 11/22/2016 1338   BASOSABS 0.0 11/22/2016 1338    . CMP Latest Ref Rng & Units 11/22/2016 11/08/2016 11/02/2016  Glucose 70 - 140 mg/dl 88 74 94  BUN 7.0 - 26.0 mg/dL 5.3(L) 7.1 6  Creatinine 0.6 - 1.1 mg/dL 0.7 0.7 0.58  Sodium 136 - 145 mEq/L 139 138 134(L)  Potassium 3.5 - 5.1 mEq/L 3.5 4.3 4.2  Chloride 101 - 111 mmol/L - - 105  CO2 22 - 29 mEq/L 25 27 25   Calcium 8.4 - 10.4 mg/dL 9.3 9.2 8.4(L)  Total Protein 6.4 - 8.3 g/dL 6.5 6.9 6.6  Total Bilirubin 0.20 - 1.20 mg/dL <0.22 <0.22 0.2(L)  Alkaline Phos 40 - 150 U/L 67 66 71  AST 5 - 34 U/L 14 14 22   ALT 0 - 55 U/L 17 12 16    . Lab Results  Component Value Date   LDH 194 10/02/2016   . Lab Results  Component Value Date   IRON 26 (L) 10/02/2016   TIBC 349 10/02/2016   IRONPCTSAT 7 (L) 10/02/2016   (Iron and TIBC)  Lab Results  Component Value Date   FERRITIN 10 10/02/2016      RADIOGRAPHIC STUDIES: I have personally reviewed the radiological images as listed and agreed with the findings in the report. No results found.  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 PLAN -Patient is here for Scheduled follow-up prior to cycle 3 day 1 of treatment.  -labs are stable and patient has no prohibitive toxicities. -continue A-AVD as per orders with Neulasta support. -Counseled on importance for infection prevention.  #2 history of iron deficiency anemia Has been on  oral iron that causes significant constipation and is still significantly anemic. S/p IV Injectafer 750mg  po x 1 dose today. hgb has improved.  #3 history of asthma. This has been  mild but tends to get worse in [place]. 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 Patient notes that she tolerates eating shrimp. -Prescribed EpiPen for when necessary use. -Recommended also to carry Benadryl.  #5 general medical cares -Patient was recommended to establish a primary care physician as soon as possible.  -continue chemotherapy as per schedule -next treatment tomorrow -PLZ schedule next 4 treatments -RTC with NP/Dr Perlov in 2 weeks with next cycle of treatment -labs with every treatment -RTC with Dr Irene Limbo with labs in 4 weeks  I spent 20 minutes counseling the patient face to face. The total time spent in the appointment was 25 minutes and more than 50% was on counseling and direct patient cares.    Sullivan Lone MD [place] AAHIVMS [facility] Hematology/Oncology Physician [facility]  (Office):       [phone] (Work cell):  [phone] (Fax):           [phone]

## 2016-12-06 ENCOUNTER — Other Ambulatory Visit: Payer: Self-pay | Admitting: *Deleted

## 2016-12-06 DIAGNOSIS — C8198 Hodgkin lymphoma, unspecified, lymph nodes of multiple sites: Secondary | ICD-10-CM

## 2016-12-07 ENCOUNTER — Ambulatory Visit (HOSPITAL_BASED_OUTPATIENT_CLINIC_OR_DEPARTMENT_OTHER): Payer: Self-pay | Admitting: Hematology and Oncology

## 2016-12-07 ENCOUNTER — Encounter: Payer: Self-pay | Admitting: Hematology and Oncology

## 2016-12-07 ENCOUNTER — Telehealth: Payer: Self-pay | Admitting: Hematology and Oncology

## 2016-12-07 ENCOUNTER — Ambulatory Visit (HOSPITAL_BASED_OUTPATIENT_CLINIC_OR_DEPARTMENT_OTHER): Payer: Self-pay

## 2016-12-07 ENCOUNTER — Other Ambulatory Visit (HOSPITAL_BASED_OUTPATIENT_CLINIC_OR_DEPARTMENT_OTHER): Payer: Self-pay

## 2016-12-07 VITALS — BP 127/93 | HR 92 | Temp 98.7°F | Resp 18 | Ht 61.0 in | Wt 167.4 lb

## 2016-12-07 DIAGNOSIS — C8190 Hodgkin lymphoma, unspecified, unspecified site: Secondary | ICD-10-CM

## 2016-12-07 DIAGNOSIS — C8198 Hodgkin lymphoma, unspecified, lymph nodes of multiple sites: Secondary | ICD-10-CM

## 2016-12-07 DIAGNOSIS — Z5189 Encounter for other specified aftercare: Secondary | ICD-10-CM

## 2016-12-07 DIAGNOSIS — Z5112 Encounter for antineoplastic immunotherapy: Secondary | ICD-10-CM

## 2016-12-07 DIAGNOSIS — Z5111 Encounter for antineoplastic chemotherapy: Secondary | ICD-10-CM

## 2016-12-07 LAB — COMPREHENSIVE METABOLIC PANEL
ALBUMIN: 3.3 g/dL — AB (ref 3.5–5.0)
ALK PHOS: 63 U/L (ref 40–150)
ALT: 13 U/L (ref 0–55)
ANION GAP: 7 meq/L (ref 3–11)
AST: 15 U/L (ref 5–34)
BUN: 5.8 mg/dL — ABNORMAL LOW (ref 7.0–26.0)
CO2: 25 meq/L (ref 22–29)
Calcium: 9.6 mg/dL (ref 8.4–10.4)
Chloride: 105 mEq/L (ref 98–109)
Creatinine: 0.7 mg/dL (ref 0.6–1.1)
GLUCOSE: 134 mg/dL (ref 70–140)
POTASSIUM: 3.6 meq/L (ref 3.5–5.1)
SODIUM: 136 meq/L (ref 136–145)
Total Bilirubin: <0.22 mg/dL (ref 0.20–1.20)
Total Protein: 6.6 g/dL (ref 6.4–8.3)

## 2016-12-07 LAB — CBC WITH DIFFERENTIAL/PLATELET
BASO%: 0.8 % (ref 0.0–2.0)
BASOS ABS: 0.1 10*3/uL (ref 0.0–0.1)
EOS ABS: 0.2 10*3/uL (ref 0.0–0.5)
EOS%: 2.6 % (ref 0.0–7.0)
HCT: 36 % (ref 34.8–46.6)
HGB: 12.3 g/dL (ref 11.6–15.9)
LYMPH%: 10.7 % — AB (ref 14.0–49.7)
MCH: 29.7 pg (ref 25.1–34.0)
MCHC: 34.1 g/dL (ref 31.5–36.0)
MCV: 87 fL (ref 79.5–101.0)
MONO#: 0.5 10*3/uL (ref 0.1–0.9)
MONO%: 5.8 % (ref 0.0–14.0)
NEUT#: 7 10*3/uL — ABNORMAL HIGH (ref 1.5–6.5)
NEUT%: 80.1 % — ABNORMAL HIGH (ref 38.4–76.8)
PLATELETS: 259 10*3/uL (ref 145–400)
RBC: 4.14 10*6/uL (ref 3.70–5.45)
RDW: 22.7 % — ABNORMAL HIGH (ref 11.2–14.5)
WBC: 8.7 10*3/uL (ref 3.9–10.3)
lymph#: 0.9 10*3/uL (ref 0.9–3.3)

## 2016-12-07 MED ORDER — NYSTATIN 100000 UNIT/ML MT SUSP: 5.0000 mL | Freq: Four times a day (QID) | OROMUCOSAL | 0 refills | Status: DC

## 2016-12-07 MED ORDER — HEPARIN SOD (PORK) LOCK FLUSH 100 UNIT/ML IV SOLN
500.0000 [IU] | Freq: Once | INTRAVENOUS | Status: AC | PRN
  Administered 2016-12-07: 500 [IU]
  Filled 2016-12-07: qty 5

## 2016-12-07 MED ORDER — DEXAMETHASONE 4 MG PO TABS: ORAL_TABLET | ORAL | 1 refills | Status: DC

## 2016-12-07 MED ORDER — PALONOSETRON HCL INJECTION 0.25 MG/5ML
0.2500 mg | Freq: Once | INTRAVENOUS | Status: AC
  Administered 2016-12-07: 0.25 mg via INTRAVENOUS

## 2016-12-07 MED ORDER — SODIUM CHLORIDE 0.9 % IV SOLN
Freq: Once | INTRAVENOUS | Status: AC
  Administered 2016-12-07: 11:00:00 via INTRAVENOUS

## 2016-12-07 MED ORDER — SODIUM CHLORIDE 0.9 % IV SOLN
1.2000 mg/kg | Freq: Once | INTRAVENOUS | Status: AC
  Administered 2016-12-07: 90 mg via INTRAVENOUS
  Filled 2016-12-07: qty 18

## 2016-12-07 MED ORDER — PALONOSETRON HCL INJECTION 0.25 MG/5ML
INTRAVENOUS | Status: AC
  Filled 2016-12-07: qty 5

## 2016-12-07 MED ORDER — SODIUM CHLORIDE 0.9 % IV SOLN
Freq: Once | INTRAVENOUS | Status: AC
  Administered 2016-12-07: 12:00:00 via INTRAVENOUS
  Filled 2016-12-07: qty 5

## 2016-12-07 MED ORDER — ALBUTEROL SULFATE (2.5 MG/3ML) 0.083% IN NEBU
2.5000 mg | INHALATION_SOLUTION | Freq: Once | RESPIRATORY_TRACT | Status: DC
  Filled 2016-12-07: qty 3

## 2016-12-07 MED ORDER — SODIUM CHLORIDE 0.9% FLUSH
10.0000 mL | INTRAVENOUS | Status: DC | PRN
  Administered 2016-12-07: 10 mL
  Filled 2016-12-07: qty 10

## 2016-12-07 MED ORDER — PEGFILGRASTIM 6 MG/0.6ML ~~LOC~~ PSKT
6.0000 mg | PREFILLED_SYRINGE | Freq: Once | SUBCUTANEOUS | Status: AC
  Administered 2016-12-07: 6 mg via SUBCUTANEOUS
  Filled 2016-12-07: qty 0.6

## 2016-12-07 MED ORDER — DOXORUBICIN HCL CHEMO IV INJECTION 2 MG/ML
25.0000 mg/m2 | Freq: Once | INTRAVENOUS | Status: AC
  Administered 2016-12-07: 44 mg via INTRAVENOUS
  Filled 2016-12-07: qty 22

## 2016-12-07 MED ORDER — VINBLASTINE SULFATE CHEMO INJECTION 1 MG/ML
5.6000 mg/m2 | Freq: Once | INTRAVENOUS | Status: AC
  Administered 2016-12-07: 10 mg via INTRAVENOUS
  Filled 2016-12-07: qty 10

## 2016-12-07 MED ORDER — SODIUM CHLORIDE 0.9 % IV SOLN
375.0000 mg/m2 | Freq: Once | INTRAVENOUS | Status: AC
  Administered 2016-12-07: 660 mg via INTRAVENOUS
  Filled 2016-12-07: qty 33

## 2016-12-07 MED FILL — NYSTATIN 100,000 UNITS/ML S: 100000 | 3 days supply | Qty: 60 | Fill #0

## 2016-12-07 MED FILL — DEXAMETHASONE 4 MG TABLET: 4 | 10 days supply | Qty: 30 | Fill #0

## 2016-12-07 NOTE — Progress Notes (Signed)
[facility] Follow-up Visit:  Assessment: Hodgkin's lymphoma ([place]) 26 year old female with stage IVb classical Hodgkin's lymphoma currently undergoing systemic curative-intent chemoimmunotherapy with A-AVD combination.   Patient appears to be tolerating treatment without undue toxicity. Intermittent oral lesions consistent with thrush likely due to recurrent steroid administration. Abdominal epigastric discomfort is likely consistent with gastritis due to steroids. Patient is already receiving proton pump inhibitor. I will add intermittent oral nystatin swish and swallow for oral thrush.  Clinical evaluation and lab work today per Mrs. to proceed with the scheduled dose of systemic treatment.  Plan: --Proceed with C3d1 A-AVD treatment with Neulasta support. No need for dose adjustments at this point in time based on the tolerance --RTC 2 weeks for labs, clinic visit with Dr Leah Floyd and possible [phone] treatment --Disease assessment with PET/CT prior to the fourth cycle of chemotherapy  Voice recognition software was used and creation of this note. Despite my best effort at editing the text, some misspelling/errors may have occurred.    Orders Placed This Encounter  Procedures  . CBC with Differential    Standing Status:   Future    Standing Expiration Date:   12/07/2017  . Uric acid    Standing Status:   Future    Standing Expiration Date:   12/07/2017  . Magnesium    Standing Status:   Future    Standing Expiration Date:   12/07/2017  . Phosphorus    Standing Status:   Future    Standing Expiration Date:   12/07/2017  . Comprehensive metabolic panel    Standing Status:   Future    Standing Expiration Date:   12/07/2017    Cancer Staging No matching staging information was found for the patient.  All questions were answered.  . The patient knows to call the clinic with any problems, questions or concerns.  This note was electronically signed.    History of  Presenting Illness Leah Floyd 26 y.o. presenting to the [facility] for Continuation of curative-intent systemic chemoimmunotherapy for diagnosis of classical Hodgkin lymphoma. Patient's primary Oncologist is Dr Leah Floyd. Patient presents to the clinic today for possible 1 of cycle 3 of systemic therapy with Brentuximab-AVD combination.  In the interim, patient reports episodic oral thrush without dysphagia. She denies any mucositis, no breakthrough nausea, vomiting, but she does have occasional epigastric discomfort. No diarrhea or constipation. Denies dysuria or hematuria. No shortness of breath, chest pain, or cough. Denies any neuropathy or swelling in the lower extremities.  Oncological/hematological History:  No history exists.    Medical History: Past Medical History:  Diagnosis Date  . Cancer [facility])    lymphoma     Surgical History: Past Surgical History:  Procedure Laterality Date  . ANTERIOR CRUCIATE LIGAMENT REPAIR     left  . HERNIA REPAIR     left  . IR FLUORO GUIDE PORT INSERTION RIGHT  10/04/2016  . IR US GUIDE VASC ACCESS RIGHT  10/04/2016    Family History: History reviewed. No pertinent family history.  Social History: Social History   Social History  . Marital status: Single    Spouse name: N/A  . Number of children: N/A  . Years of education: N/A   Occupational History  . Not on file.   Social History Main Topics  . Smoking status: Never Smoker  . Smokeless tobacco: Never Used  . Alcohol use Yes     Comment: socially  . Drug use: Yes    Types: Marijuana  Comment: medical   . Sexual activity: Not on file   Other Topics Concern  . Not on file   Social History Narrative  . No narrative on file    Allergies: Allergies  Allergen Reactions  . Shellfish-Derived Products Swelling    Medications:  Current Outpatient Prescriptions  Medication Sig Dispense Refill  . albuterol (PROVENTIL HFA;VENTOLIN HFA) 108 (90 Base) MCG/ACT inhaler  Inhale 2 puffs into the lungs every 6 (six) hours as needed for wheezing or shortness of breath. 1 Inhaler 2  . Ascorbic Acid (VITAMIN C PO) Take by mouth.    . Cyanocobalamin (VITAMIN B-12 CR PO) Take 1 tablet by mouth daily.    [name] dexamethasone (DECADRON) 4 MG tablet Take 2 tablets by mouth once a day on the day after chemotherapy and then take 2 tablets two times a day for 2 days. Take with food. 30 tablet 1  . diphenhydramine-acetaminophen (TYLENOL PM) 25-500 MG TABS tablet Take 2 tablets by mouth at bedtime as needed.    [name] HYDROcodone-acetaminophen (NORCO/VICODIN) 5-325 MG tablet Take 1-2 tablets by mouth every 6 (six) hours as needed for moderate pain or severe pain. 30 tablet 0  . lidocaine-prilocaine (EMLA) cream Apply to affected area once 30 g 3  . loratadine (CLARITIN) 10 MG tablet Take 10 mg by mouth daily.    [name] LORazepam (ATIVAN) 0.5 MG tablet Take 1 tablet (0.5 mg total) by mouth every 6 (six) hours as needed (Nausea or vomiting). 30 tablet 0  . LORazepam (ATIVAN) 0.5 MG tablet Take 1 tablet (0.5 mg total) by mouth every 6 (six) hours as needed (Nausea or vomiting). 30 tablet 0  . magic mouthwash w/lidocaine SOLN Take 5 mLs by mouth 4 (four) times daily as needed for mouth pain. 120 mL 0  . omeprazole (PRILOSEC) 20 MG capsule Take 20 mg by mouth daily.    . ondansetron (ZOFRAN) 8 MG tablet Take 1 tablet (8 mg total) by mouth 2 (two) times daily as needed. Start on the third day after chemotherapy. 30 tablet 1  . PEGFILGRASTIM [facility] Apply 1 patch topically once.    . prochlorperazine (COMPAZINE) 10 MG tablet Take 1 tablet (10 mg total) by mouth every 6 (six) hours as needed (Nausea or vomiting). 30 tablet 1  . senna-docusate (SENNA S) 8.6-50 MG tablet Take 2 tablets by mouth at bedtime. Hold if diarrhea ensues 60 tablet 1  . EPINEPHrine 0.3 mg/0.3 mL IJ SOAJ injection Inject 0.3 mLs (0.3 mg total) into the muscle once. For anaphylactic reactions 1 Device 0  . nystatin (MYCOSTATIN) 100000  UNIT/ML suspension Take 5 mLs (500,000 Units total) by mouth 4 (four) times daily. Take if developing thrush. If only oral patches -- swish and spit, if pain with swallowing -- swish and swallow 60 mL 0   No current facility-administered medications for this visit.    Facility-Administered Medications Ordered in Other Visits  Medication Dose Route Frequency Provider Last Rate Last Dose  . albuterol (PROVENTIL) (2.5 MG/3ML) 0.083% nebulizer solution 2.5 mg  2.5 mg Nebulization Once Brunetta Genera, MD      . sodium chloride flush (NS) 0.9 % injection 10 mL  10 mL Intracatheter PRN Brunetta Genera, MD   10 mL at 12/07/16 1432    Review of Systems: Review of Systems  All other systems reviewed and are negative.    PHYSICAL EXAMINATION Blood pressure (!) 127/93, pulse 92, temperature 98.7 F (37.1 C), temperature source Oral, resp. rate 18, height  '5\' 1"'  (1.549 m), weight 167 lb 6.4 oz (75.9 kg), SpO2 100 %.  ECOG PERFORMANCE STATUS: 0 - Asymptomatic  Physical Exam  Constitutional: She is oriented to person, place, and time and well-developed, well-nourished, and in no distress.  HENT:  Head: Normocephalic.  Mouth/Throat: Oropharynx is clear and moist. No oropharyngeal exudate.  Eyes: EOM are normal. Pupils are equal, round, and reactive to light. No scleral icterus.  Neck: No thyromegaly present.  Cardiovascular: Normal rate and regular rhythm.  Exam reveals no gallop.   No murmur heard. No lower extremity edema.  Pulmonary/Chest: Effort normal and breath sounds normal. No respiratory distress. She has no wheezes.  Abdominal: Soft. She exhibits no distension and no mass. There is no tenderness.  Musculoskeletal: She exhibits no edema.  Lymphadenopathy:    She has no cervical adenopathy.  Neurological: She is alert and oriented to person, place, and time. She has normal reflexes.  No sensory deficits.  Skin: Skin is warm and dry. No rash noted. She is not diaphoretic. No  erythema.     LABORATORY DATA: I have personally reviewed the data as listed: Appointment on 12/07/2016  Component Date Value Ref Range Status  . WBC 12/07/2016 8.7  3.9 - 10.3 10e3/uL Final  . NEUT# 12/07/2016 7.0* 1.5 - 6.5 10e3/uL Final  . HGB 12/07/2016 12.3  11.6 - 15.9 g/dL Final  . HCT 12/07/2016 36.0  34.8 - 46.6 % Final  . Platelets 12/07/2016 259  145 - 400 10e3/uL Final  . MCV 12/07/2016 87.0  79.5 - 101.0 fL Final  . MCH 12/07/2016 29.7  25.1 - 34.0 pg Final  . MCHC 12/07/2016 34.1  31.5 - 36.0 g/dL Final  . RBC 12/07/2016 4.14  3.70 - 5.45 10e6/uL Final  . RDW 12/07/2016 22.7* 11.2 - 14.5 % Final  . lymph# 12/07/2016 0.9  0.9 - 3.3 10e3/uL Final  . MONO# 12/07/2016 0.5  0.1 - 0.9 10e3/uL Final  . Eosinophils Absolute 12/07/2016 0.2  0.0 - 0.5 10e3/uL Final  . Basophils Absolute 12/07/2016 0.1  0.0 - 0.1 10e3/uL Final  . NEUT% 12/07/2016 80.1* 38.4 - 76.8 % Final  . LYMPH% 12/07/2016 10.7* 14.0 - 49.7 % Final  . MONO% 12/07/2016 5.8  0.0 - 14.0 % Final  . EOS% 12/07/2016 2.6  0.0 - 7.0 % Final  . BASO% 12/07/2016 0.8  0.0 - 2.0 % Final  . Sodium 12/07/2016 136  136 - 145 mEq/L Final  . Potassium 12/07/2016 3.6  3.5 - 5.1 mEq/L Final  . Chloride 12/07/2016 105  98 - 109 mEq/L Final  . CO2 12/07/2016 25  22 - 29 mEq/L Final  . Glucose 12/07/2016 134  70 - 140 mg/dl Final   Glucose reference range is for nonfasting patients. Fasting glucose reference range is 70- 100.  [name] BUN 12/07/2016 5.8* 7.0 - 26.0 mg/dL Final  . Creatinine 12/07/2016 0.7  0.6 - 1.1 mg/dL Final  . Total Bilirubin 12/07/2016 <0.22  0.20 - 1.20 mg/dL Final  . Alkaline Phosphatase 12/07/2016 63  40 - 150 U/L Final  . AST 12/07/2016 15  5 - 34 U/L Final  . ALT 12/07/2016 13  0 - 55 U/L Final  . Total Protein 12/07/2016 6.6  6.4 - 8.3 g/dL Final  . Albumin 12/07/2016 3.3* 3.5 - 5.0 g/dL Final  . Calcium 12/07/2016 9.6  8.4 - 10.4 mg/dL Final  . Anion Gap 12/07/2016 7  3 - 11 mEq/L Final  . EGFR  12/07/2016 >90  >  90 ml/min/1.73 m2 Final   eGFR is calculated using the CKD-EPI Creatinine Equation (2009)       Ardath Sax, MD

## 2016-12-07 NOTE — Patient Instructions (Signed)
[facility] Discharge Instructions for Patients Receiving Chemotherapy  Today you received the following chemotherapy agents Adriamycin, Vinblastine, Dacarbazine, and Brentuximab  To help prevent nausea and vomiting after your treatment, we encourage you to take your nausea medication as directed   If you develop nausea and vomiting that is not controlled by your nausea medication, call the clinic.   BELOW ARE SYMPTOMS THAT SHOULD BE REPORTED IMMEDIATELY:  *FEVER GREATER THAN 100.5 F  *CHILLS WITH OR WITHOUT FEVER  NAUSEA AND VOMITING THAT IS NOT CONTROLLED WITH YOUR NAUSEA MEDICATION  *UNUSUAL SHORTNESS OF BREATH  *UNUSUAL BRUISING OR BLEEDING  TENDERNESS IN MOUTH AND THROAT WITH OR WITHOUT PRESENCE OF ULCERS  *URINARY PROBLEMS  *BOWEL PROBLEMS  UNUSUAL RASH Items with * indicate a potential emergency and should be followed up as soon as possible.  Feel free to call the clinic you have any questions or concerns. The clinic phone number is [phone].  Please show the [place] at check-in to the Emergency Department and triage nurse.

## 2016-12-07 NOTE — Telephone Encounter (Signed)
No los - appts already scheduled.

## 2016-12-07 NOTE — Patient Instructions (Signed)
Thank you for choosing [facility] to provide your oncology and hematology care.  To afford each patient quality time with our providers, please arrive 30 minutes before your scheduled appointment time.  If you arrive late for your appointment, you may be asked to reschedule.  We strive to give you quality time with our providers, and arriving late affects you and other patients whose appointments are after yours.   If you are a no show for multiple scheduled visits, you may be dismissed from the clinic at the providers discretion.    Again, thank you for choosing [facility], our hope is that these requests will decrease the amount of time that you wait before being seen by our physicians.  ______________________________________________________________________  Should you have questions after your visit to the [facility], please contact our office at [phone] between the hours of 8:30 and 4:30 p.m.    Voicemails left after 4:30p.m will not be returned until the following business day.    For prescription refill requests, please have your pharmacy contact us directly.  Please also try to allow 48 hours for prescription requests.    Please contact the scheduling department for questions regarding scheduling.  For scheduling of procedures such as PET scans, CT scans, MRI, Ultrasound, etc please contact central scheduling at [phone].    Resources For Cancer Patients and Caregivers:   Oncolink.org:  A wonderful resource for patients and healthcare providers for information regarding your disease, ways to tract your treatment, what to expect, etc.     American Cancer Society:  800-227-2345  Can help patients locate various types of support and financial assistance  Cancer Care: 1-800-813-HOPE (4673) Provides financial assistance, online support groups, medication/co-pay assistance.    [place] DSS:  336-641-3447 Where to apply for food  stamps, Medicaid, and utility assistance  Medicare Rights Center: 800-333-4114 Helps people with Medicare understand their rights and benefits, navigate the Medicare system, and secure the quality healthcare they deserve  SCAT: 336-333-6589 [employer] [employer]'s shared-ride transportation service for eligible riders who have a disability that prevents them from riding the fixed route bus.    For additional information on assistance programs please contact our social worker:   Grier Hock/Abigail Elmore:  [phone]            

## 2016-12-07 NOTE — Assessment & Plan Note (Signed)
26 year old female with stage IVb classical Hodgkin's lymphoma currently undergoing systemic curative-intent chemoimmunotherapy with A-AVD combination.   Patient appears to be tolerating treatment without undue toxicity. Intermittent oral lesions consistent with thrush likely due to recurrent steroid administration. Abdominal epigastric discomfort is likely consistent with gastritis due to steroids. Patient is already receiving proton pump inhibitor. I will add intermittent oral nystatin swish and swallow for oral thrush.  Clinical evaluation and lab work today per Mrs. to proceed with the scheduled dose of systemic treatment.  Plan: --Proceed with C3d1 A-AVD treatment with Neulasta support. No need for dose adjustments at this point in time based on the tolerance --RTC 2 weeks for labs, clinic visit with Dr Irene Limbo and possible [phone] treatment --Disease assessment with PET/CT prior to the fourth cycle of chemotherapy  Voice recognition software was used and creation of this note. Despite my best effort at editing the text, some misspelling/errors may have occurred.

## 2016-12-18 ENCOUNTER — Encounter: Payer: Self-pay | Admitting: Pharmacy Technician

## 2016-12-18 NOTE — Progress Notes (Signed)
The patient is approved by [facility] for Adcet[employer]. Coverage is from 12/17/16 until 03/19/17 (three months). The enrollment is based on self pay.

## 2016-12-19 ENCOUNTER — Other Ambulatory Visit (HOSPITAL_BASED_OUTPATIENT_CLINIC_OR_DEPARTMENT_OTHER): Payer: Self-pay

## 2016-12-19 ENCOUNTER — Ambulatory Visit (HOSPITAL_BASED_OUTPATIENT_CLINIC_OR_DEPARTMENT_OTHER): Payer: Self-pay | Admitting: Hematology

## 2016-12-19 ENCOUNTER — Encounter: Payer: Self-pay | Admitting: Hematology

## 2016-12-19 VITALS — BP 131/89 | HR 91 | Temp 98.0°F | Resp 20 | Ht 61.0 in | Wt 171.2 lb

## 2016-12-19 DIAGNOSIS — D509 Iron deficiency anemia, unspecified: Secondary | ICD-10-CM

## 2016-12-19 DIAGNOSIS — C8198 Hodgkin lymphoma, unspecified, lymph nodes of multiple sites: Secondary | ICD-10-CM

## 2016-12-19 DIAGNOSIS — C8194 Hodgkin lymphoma, unspecified, lymph nodes of axilla and upper limb: Secondary | ICD-10-CM

## 2016-12-19 DIAGNOSIS — K1230 Oral mucositis (ulcerative), unspecified: Secondary | ICD-10-CM

## 2016-12-19 LAB — MAGNESIUM: MAGNESIUM: 1.8 mg/dL (ref 1.5–2.5)

## 2016-12-19 LAB — COMPREHENSIVE METABOLIC PANEL
ALBUMIN: 3.2 g/dL — AB (ref 3.5–5.0)
ALT: 14 U/L (ref 0–55)
ANION GAP: 9 meq/L (ref 3–11)
AST: 14 U/L (ref 5–34)
Alkaline Phosphatase: 71 U/L (ref 40–150)
BUN: 3.5 mg/dL — ABNORMAL LOW (ref 7.0–26.0)
CALCIUM: 9 mg/dL (ref 8.4–10.4)
CO2: 25 meq/L (ref 22–29)
CREATININE: 0.7 mg/dL (ref 0.6–1.1)
Chloride: 105 mEq/L (ref 98–109)
EGFR: >90 mL/min/{1.73_m2} (ref >90)
Glucose: 102 mg/dl (ref 70–140)
Potassium: 4.1 mEq/L (ref 3.5–5.1)
Sodium: 139 mEq/L (ref 136–145)
TOTAL PROTEIN: 6.3 g/dL — AB (ref 6.4–8.3)

## 2016-12-19 LAB — CBC WITH DIFFERENTIAL/PLATELET
BASO%: 0.6 % (ref 0.0–2.0)
BASOS ABS: 0 10*3/uL (ref 0.0–0.1)
EOS ABS: 0.1 10*3/uL (ref 0.0–0.5)
EOS%: 1.6 % (ref 0.0–7.0)
HEMATOCRIT: 35.1 % (ref 34.8–46.6)
HEMOGLOBIN: 11.7 g/dL (ref 11.6–15.9)
LYMPH#: 1 10*3/uL (ref 0.9–3.3)
LYMPH%: 15 % (ref 14.0–49.7)
MCH: 29.7 pg (ref 25.1–34.0)
MCHC: 33.3 g/dL (ref 31.5–36.0)
MCV: 89.1 fL (ref 79.5–101.0)
MONO#: 0.9 10*3/uL (ref 0.1–0.9)
MONO%: 13.1 % (ref 0.0–14.0)
NEUT#: 4.7 10*3/uL (ref 1.5–6.5)
NEUT%: 69.7 % (ref 38.4–76.8)
PLATELETS: 274 10*3/uL (ref 145–400)
RBC: 3.94 10*6/uL (ref 3.70–5.45)
RDW: 19.6 % — AB (ref 11.2–14.5)
WBC: 6.8 10*3/uL (ref 3.9–10.3)

## 2016-12-19 LAB — URIC ACID: Uric Acid, Serum: 4.2 mg/dl (ref 2.6–7.4)

## 2016-12-19 MED ORDER — HYDROCODONE-ACETAMINOPHEN 5-325 MG PO TABS: 1.0000 | ORAL_TABLET | Freq: Four times a day (QID) | ORAL | 0 refills | Status: DC | PRN

## 2016-12-19 MED ORDER — LIDOCAINE-PRILOCAINE 2.5-2.5 % EX CREA: TOPICAL_CREAM | CUTANEOUS | 3 refills | Status: DC

## 2016-12-19 MED ORDER — NYSTATIN 100000 UNIT/ML MT SUSP: OROMUCOSAL | 0 refills | Status: DC

## 2016-12-19 MED ORDER — SUCRALFATE 1 GM/10ML PO SUSP: 1.0000 g | Freq: Three times a day (TID) | ORAL | 0 refills | Status: DC

## 2016-12-19 MED ORDER — MAGIC MOUTHWASH W/LIDOCAINE: 5.0000 mL | Freq: Four times a day (QID) | ORAL | 0 refills | Status: DC | PRN

## 2016-12-19 MED FILL — LIDOCAINE-PRILOCAINE CREAM: 2.5-2.5 | 15 days supply | Qty: 30 | Fill #0

## 2016-12-19 NOTE — Progress Notes (Signed)
[name]    HEMATOLOGY/ONCOLOGY CLINIC NOTE  Date of Service: .12/19/2016  Patient Care Team: Brunetta Genera, MD as PCP - General (Hematology) Patient, No Pcp Per (General Practice)  CHIEF COMPLAINTS/PURPOSE OF CONSULTATION:  Newly diagnosed classical Hodgkin's lymphoma  HISTORY OF PRESENTING ILLNESS:   Leah Floyd is a wonderful 26 y.o. female who has been referred to [place] by [employer] [facility] for evaluation and management of newly diagnosed classical Hodgkin's lymphoma.  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  She notes that in November 2017 she developed generalized body aches and flulike symptoms with some subjective fevers chills and night sweats. A primary care physician noted a right axillary lump and she was treated empirically with Z-Pak for possible infection. She notes that around Christmas time in 2017 she developed worsening fatigue and generalized body aches a point that she was having difficulties getting through the day. She notes that she had an autoimmune workup HIV testing which was unrevealing she was noted to have some iron deficiency. She notes that she had a follow-up ultrasound that showed right axillary lymph node was enlarging and she also had a left axillary lymph node and had noted inguinal lymph nodes in the groin. Some of her workup was delayed due to insurance issues that she eventually had a left axillary lymph node biopsy on 09/24/2016 accession number [id number] which showed classical Hodgkin's lymphoma.  Patient then has moved back from [place] to be with her mother and family in [place] not, and is here to seek further cares for her classical Hodgkin's lymphoma newly diagnosed.  Patient notes significant fatigue, night sweats, weight loss of about 40  pounds in the last 4-5 months, generalized body aches, dry cough and loss of appetite.  She notes that she previously had heavy menstrual periods lasting 7 days which are now become lighter and last about 4 days. She has been taking ferrous sulfate plus vitamin C for her iron deficiency anemia.  No previous heart problems. Has been having some mild diarrhea which she describes as soft stools 2-3 times a day.  INTERVAL HISTORY  Leah Floyd is here with a friend for a followup prior to C3 D15 of her chemotherapy. She notes some mild grade 1-2 mucositis and possibly oral thrush. Was treated with nystatin as per Dr. Lebron Conners. Currently oral symptoms of result. Notes some body aches which are significant with the Neulasta despite using Claritin and was given a refill on her Vicodin. No other acute new symptoms. No fevers or chills no night sweats. Eating well. Trying to walk at least 20-30 minutes daily.   MEDICAL HISTORY:   #1 iron deficiency anemia #2 GERD #3 obesity. #4 pseudotumor cerebri however this has resolved with her weight loss as per patient. #5 asthma #6 of newly diagnosed Hodgkin's lymphoma #7 shellfish allergy #8 history of Chlamydia infection   SURGICAL HISTORY: Past Surgical History:  Procedure Laterality Date  . ANTERIOR CRUCIATE LIGAMENT REPAIR     left  . HERNIA REPAIR     left  . IR FLUORO GUIDE PORT INSERTION RIGHT  10/04/2016  . IR US GUIDE VASC ACCESS RIGHT  10/04/2016    SOCIAL HISTORY: Social History   Social History  . Marital status: Single    Spouse name: N/A  . Number of children: N/A  . Years of education:  N/A   Occupational History  . Not on file.   Social History Main Topics  . Smoking status: Never Smoker  . Smokeless tobacco: Never Used  . Alcohol use Yes     Comment: socially  . Drug use: Yes    Types: Marijuana     Comment: medical   . Sexual activity: Not on file   Other Topics Concern  . Not on file   Social History Narrative  .  No narrative on file    FAMILY HISTORY: Notes her first cousin on her mom's side had some form off rare bone marrow cancer Does not know history on her father's side of the family Maternal grandmother-hypertension, diabetes, fibromyalgia Mother anemia Other relatives on her mom's side with lymphoma and bladder cancer.  ALLERGIES:  is allergic to shellfish-derived products.  MEDICATIONS:  Current Outpatient Prescriptions  Medication Sig Dispense Refill  . albuterol (PROVENTIL HFA;VENTOLIN HFA) 108 (90 Base) MCG/ACT inhaler Inhale 2 puffs into the lungs every 6 (six) hours as needed for wheezing or shortness of breath. 1 Inhaler 2  . Ascorbic Acid (VITAMIN C PO) Take by mouth.    . Cyanocobalamin (VITAMIN B-12 CR PO) Take 1 tablet by mouth daily.    [name] dexamethasone (DECADRON) 4 MG tablet Take 2 tablets by mouth once a day on the day after chemotherapy and then take 2 tablets two times a day for 2 days. Take with food. 30 tablet 1  . diphenhydramine-acetaminophen (TYLENOL PM) 25-500 MG TABS tablet Take 2 tablets by mouth at bedtime as needed.    [name] HYDROcodone-acetaminophen (NORCO/VICODIN) 5-325 MG tablet Take 1-2 tablets by mouth every 6 (six) hours as needed for moderate pain or severe pain. 30 tablet 0  . lidocaine-prilocaine (EMLA) cream Apply to affected area once 30 g 3  . loratadine (CLARITIN) 10 MG tablet Take 10 mg by mouth daily.    [name] LORazepam (ATIVAN) 0.5 MG tablet Take 1 tablet (0.5 mg total) by mouth every 6 (six) hours as needed (Nausea or vomiting). 30 tablet 0  . LORazepam (ATIVAN) 0.5 MG tablet Take 1 tablet (0.5 mg total) by mouth every 6 (six) hours as needed (Nausea or vomiting). 30 tablet 0  . magic mouthwash w/lidocaine SOLN Take 5 mLs by mouth 4 (four) times daily as needed for mouth pain. 120 mL 0  . nystatin (MYCOSTATIN) 100000 UNIT/ML suspension Take if developing thrush. If only oral patches -- swish and spit, if pain with swallowing -- swish and swallow 60 mL 0  .  omeprazole (PRILOSEC) 20 MG capsule Take 20 mg by mouth daily.    . ondansetron (ZOFRAN) 8 MG tablet Take 1 tablet (8 mg total) by mouth 2 (two) times daily as needed. Start on the third day after chemotherapy. 30 tablet 1  . PEGFILGRASTIM [place] Apply 1 patch topically once.    . prochlorperazine (COMPAZINE) 10 MG tablet Take 1 tablet (10 mg total) by mouth every 6 (six) hours as needed (Nausea or vomiting). 30 tablet 1  . senna-docusate (SENNA S) 8.6-50 MG tablet Take 2 tablets by mouth at bedtime. Hold if diarrhea ensues 60 tablet 1  . EPINEPHrine 0.3 mg/0.3 mL IJ SOAJ injection Inject 0.3 mLs (0.3 mg total) into the muscle once. For anaphylactic reactions 1 Device 0  . sucralfate (CARAFATE) 1 GM/10ML suspension Take 10 mLs (1 g total) by mouth 4 (four) times daily -  with meals and at bedtime. 420 mL 0   No current facility-administered medications for  this visit.     REVIEW OF SYSTEMS:    10 Point review of Systems was done is negative except as noted above.  PHYSICAL EXAMINATION: ECOG PERFORMANCE STATUS: 1 - Symptomatic but completely ambulatory  . Vitals:   12/19/16 0819  BP: 131/89  Pulse: 91  Resp: 20  Temp: 98 F (36.7 C)   Filed Weights   12/19/16 0819  Weight: 171 lb 3.2 oz (77.7 kg)   .Body mass index is 32.35 kg/m.  GENERAL:alert, in no acute distress and comfortable SKIN: no acute rashes, no significant lesions EYES: conjunctiva are pink and non-injected, sclera anicteric OROPHARYNX: MMM, no exudates, no oropharyngeal erythema or ulceration NECK: supple, no JVD LYMPH:  Patient has palpable lymphadenopathy bilaterally in her neck , occipital area, supraclavicular, bilateral axillary and inguinal areas  LUNGS: clear to auscultation b/l with normal respiratory effort HEART: regular rate & rhythm ABDOMEN:  normoactive bowel sounds , non tender, not distended. Borderline palpable splenomegaly, no palpable hepatomegaly. Extremity: no pedal edema PSYCH: alert &  oriented x 3 with fluent speech NEURO: no focal motor/sensory deficits  LABORATORY DATA:  I have reviewed the data as listed  . CBC Latest Ref Rng & Units 12/19/2016 12/07/2016 11/22/2016  WBC 3.9 - 10.3 10e3/uL 6.8 8.7 7.3  Hemoglobin 11.6 - 15.9 g/dL 11.7 12.3 11.8  Hematocrit 34.8 - 46.6 % 35.1 36.0 34.9  Platelets 145 - 400 10e3/uL 274 259 229   ANC 4.7k . CBC    Component Value Date/Time   WBC 6.8 12/19/2016 0800   WBC 16.8 (H) 11/02/2016 1030   RBC 3.94 12/19/2016 0800   RBC 4.44 11/02/2016 1030   HGB 11.7 12/19/2016 0800   HCT 35.1 12/19/2016 0800   PLT 274 12/19/2016 0800   MCV 89.1 12/19/2016 0800   MCH 29.7 12/19/2016 0800   MCH 27.5 11/02/2016 1030   MCHC 33.3 12/19/2016 0800   MCHC 33.4 11/02/2016 1030   RDW 19.6 (H) 12/19/2016 0800   LYMPHSABS 1.0 12/19/2016 0800   MONOABS 0.9 12/19/2016 0800   EOSABS 0.1 12/19/2016 0800   BASOSABS 0.0 12/19/2016 0800    . CMP Latest Ref Rng & Units 12/19/2016 12/07/2016 11/22/2016  Glucose 70 - 140 mg/dl 102 134 88  BUN 7.0 - 26.0 mg/dL 3.5(L) 5.8(L) 5.3(L)  Creatinine 0.6 - 1.1 mg/dL 0.7 0.7 0.7  Sodium 136 - 145 mEq/L 139 136 139  Potassium 3.5 - 5.1 mEq/L 4.1 3.6 3.5  Chloride 101 - 111 mmol/L - - -  CO2 22 - 29 mEq/L 25 25 25   Calcium 8.4 - 10.4 mg/dL 9.0 9.6 9.3  Total Protein 6.4 - 8.3 g/dL 6.3(L) 6.6 6.5  Total Bilirubin 0.20 - 1.20 mg/dL <0.22 <0.22 <0.22  Alkaline Phos 40 - 150 U/L 71 63 67  AST 5 - 34 U/L 14 15 14   ALT 0 - 55 U/L 14 13 17    .  RADIOGRAPHIC STUDIES: I have personally reviewed the radiological images as listed and agreed with the findings in the report. No results found.  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 PLAN -Patient is here for Scheduled follow-up prior to cycle 3  day 15 of treatment.  -labs are stable and patient has no prohibitive toxicities. -continue A-AVD as per orders with Neulasta support. She is due for  her cycle 3 day 15 of treatment on 12/21/2016. -We shall repeat a PET CT scan in the next 10-12 days to reassess response to treatment. -If she is responding as expected will complete the planned 6 cycles of treatment.   #2 Grade 1-2 oral mucositis and oral thrush . now resolved status post use of nystatin . Plan  - oral cryotherapy while getting chemotherapy - patient was counseled on this . - given refills for Magic mouthwash when necessary  - given prescription for sucralfate . - recommended to call us immediately if any new signs of thrush since we will have to be proactive about treating this with oral Diflucan if needed . -Repeat sedimentation rate in 2 weeks.. -Given refill of Vicodin for significant Neulasta associated bone pains despite use of Claritin.  #3 history of iron deficiency anemia Has been on oral iron that causes significant constipation and is still significantly anemic. S/p IV Injectafer 750mg  po x 1 dose today. hgb has improved.  plan  - We'll repeat iron labs on follow-up in 2 weeks .  #4 history of asthma. This has been mild but tends to get worse in [place]. 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 Patient notes that she tolerates eating shrimp. -Prescribed EpiPen for when necessary use. -Recommended also to carry Benadryl.  #5 general medical cares -Patient was recommended to establish a primary care physician as soon as possible.   -continue treatment as per schedule - plz schedule remaining 3 cycles of treatment -PET/CT in 10-12 days -RTC with Dr Irene Limbo C4D1 with labs and PET/CT  I spent 20 minutes counseling the patient face to face. The total time spent in the appointment was 25 minutes and more than 50% was on  counseling and direct patient cares.    Sullivan Lone MD [place] AAHIVMS [facility] Hematology/Oncology Physician [facility]  (Office):       [phone] (Work cell):  [phone] (Fax):           [phone]

## 2016-12-19 NOTE — Patient Instructions (Signed)
Thank you for choosing [facility] to provide your oncology and hematology care.  To afford each patient quality time with our providers, please arrive 30 minutes before your scheduled appointment time.  If you arrive late for your appointment, you may be asked to reschedule.  We strive to give you quality time with our providers, and arriving late affects you and other patients whose appointments are after yours.   If you are a no show for multiple scheduled visits, you may be dismissed from the clinic at the providers discretion.    Again, thank you for choosing [facility], our hope is that these requests will decrease the amount of time that you wait before being seen by our physicians.  ______________________________________________________________________  Should you have questions after your visit to the [facility], please contact our office at [phone] between the hours of 8:30 and 4:30 p.m.    Voicemails left after 4:30p.m will not be returned until the following business day.    For prescription refill requests, please have your pharmacy contact us directly.  Please also try to allow 48 hours for prescription requests.    Please contact the scheduling department for questions regarding scheduling.  For scheduling of procedures such as PET scans, CT scans, MRI, Ultrasound, etc please contact central scheduling at [phone].    Resources For Cancer Patients and Caregivers:   Oncolink.org:  A wonderful resource for patients and healthcare providers for information regarding your disease, ways to tract your treatment, what to expect, etc.     American Cancer Society:  800-227-2345  Can help patients locate various types of support and financial assistance  Cancer Care: 1-800-813-HOPE (4673) Provides financial assistance, online support groups, medication/co-pay assistance.    [place] DSS:  336-641-3447 Where to apply for food  stamps, Medicaid, and utility assistance  Medicare Rights Center: 800-333-4114 Helps people with Medicare understand their rights and benefits, navigate the Medicare system, and secure the quality healthcare they deserve  SCAT: 336-333-6589 [place] [employer]'s shared-ride transportation service for eligible riders who have a disability that prevents them from riding the fixed route bus.    For additional information on assistance programs please contact our social worker:   Grier Hock/Abigail Elmore:  [phone]            

## 2016-12-20 LAB — PHOSPHORUS: PHOSPHORUS: 3.9 mg/dL (ref 2.5–4.5)

## 2016-12-21 ENCOUNTER — Ambulatory Visit (HOSPITAL_BASED_OUTPATIENT_CLINIC_OR_DEPARTMENT_OTHER): Payer: Self-pay

## 2016-12-21 VITALS — BP 131/90 | HR 93 | Temp 98.8°F | Resp 16

## 2016-12-21 DIAGNOSIS — Z5189 Encounter for other specified aftercare: Secondary | ICD-10-CM

## 2016-12-21 DIAGNOSIS — Z5111 Encounter for antineoplastic chemotherapy: Secondary | ICD-10-CM

## 2016-12-21 DIAGNOSIS — C8198 Hodgkin lymphoma, unspecified, lymph nodes of multiple sites: Secondary | ICD-10-CM

## 2016-12-21 DIAGNOSIS — C8194 Hodgkin lymphoma, unspecified, lymph nodes of axilla and upper limb: Secondary | ICD-10-CM

## 2016-12-21 DIAGNOSIS — Z5112 Encounter for antineoplastic immunotherapy: Secondary | ICD-10-CM

## 2016-12-21 MED ORDER — ALBUTEROL SULFATE (2.5 MG/3ML) 0.083% IN NEBU
2.5000 mg | INHALATION_SOLUTION | Freq: Once | RESPIRATORY_TRACT | Status: DC
  Filled 2016-12-21: qty 3

## 2016-12-21 MED ORDER — DOXORUBICIN HCL CHEMO IV INJECTION 2 MG/ML
25.0000 mg/m2 | Freq: Once | INTRAVENOUS | Status: AC
  Administered 2016-12-21: 44 mg via INTRAVENOUS
  Filled 2016-12-21: qty 22

## 2016-12-21 MED ORDER — VINBLASTINE SULFATE CHEMO INJECTION 1 MG/ML
5.6000 mg/m2 | Freq: Once | INTRAVENOUS | Status: AC
  Administered 2016-12-21: 10 mg via INTRAVENOUS
  Filled 2016-12-21: qty 10

## 2016-12-21 MED ORDER — SODIUM CHLORIDE 0.9 % IV SOLN
Freq: Once | INTRAVENOUS | Status: AC
  Administered 2016-12-21: 13:00:00 via INTRAVENOUS

## 2016-12-21 MED ORDER — PEGFILGRASTIM 6 MG/0.6ML ~~LOC~~ PSKT
6.0000 mg | PREFILLED_SYRINGE | Freq: Once | SUBCUTANEOUS | Status: AC
  Administered 2016-12-21: 6 mg via SUBCUTANEOUS
  Filled 2016-12-21: qty 0.6

## 2016-12-21 MED ORDER — SODIUM CHLORIDE 0.9 % IV SOLN
375.0000 mg/m2 | Freq: Once | INTRAVENOUS | Status: AC
  Administered 2016-12-21: 660 mg via INTRAVENOUS
  Filled 2016-12-21: qty 33

## 2016-12-21 MED ORDER — PALONOSETRON HCL INJECTION 0.25 MG/5ML
INTRAVENOUS | Status: AC
  Filled 2016-12-21: qty 5

## 2016-12-21 MED ORDER — SODIUM CHLORIDE 0.9 % IV SOLN
1.2000 mg/kg | Freq: Once | INTRAVENOUS | Status: AC
  Administered 2016-12-21: 90 mg via INTRAVENOUS
  Filled 2016-12-21: qty 18

## 2016-12-21 MED ORDER — SODIUM CHLORIDE 0.9 % IV SOLN
Freq: Once | INTRAVENOUS | Status: AC
  Administered 2016-12-21: 13:00:00 via INTRAVENOUS
  Filled 2016-12-21: qty 5

## 2016-12-21 MED ORDER — HEPARIN SOD (PORK) LOCK FLUSH 100 UNIT/ML IV SOLN
500.0000 [IU] | Freq: Once | INTRAVENOUS | Status: AC | PRN
  Administered 2016-12-21: 500 [IU]
  Filled 2016-12-21: qty 5

## 2016-12-21 MED ORDER — PALONOSETRON HCL INJECTION 0.25 MG/5ML
0.2500 mg | Freq: Once | INTRAVENOUS | Status: AC
  Administered 2016-12-21: 0.25 mg via INTRAVENOUS

## 2016-12-21 MED ORDER — SODIUM CHLORIDE 0.9% FLUSH
10.0000 mL | INTRAVENOUS | Status: DC | PRN
  Administered 2016-12-21: 10 mL
  Filled 2016-12-21: qty 10

## 2016-12-21 MED FILL — MAGIC MW LIDO/MYL/NYS 1:1:1: 6 days supply | Qty: 120 | Fill #0

## 2016-12-21 MED FILL — HYDROCODON-APAP 5-325: 5-325 | 4 days supply | Qty: 30 | Fill #0

## 2016-12-21 MED FILL — CARAFATE 1 GM/10 ML SUSP: 1 | 10 days supply | Qty: 420 | Fill #0

## 2016-12-21 NOTE — Patient Instructions (Signed)
[facility] Discharge Instructions for Patients Receiving Chemotherapy  Today you received the following chemotherapy agents:  Adriamycin, Vinblastine, DTIC, and Adcetris.  To help prevent nausea and vomiting after your treatment, we encourage you to take your nausea medication as directed.   If you develop nausea and vomiting that is not controlled by your nausea medication, call the clinic.   BELOW ARE SYMPTOMS THAT SHOULD BE REPORTED IMMEDIATELY:  *FEVER GREATER THAN 100.5 F  *CHILLS WITH OR WITHOUT FEVER  NAUSEA AND VOMITING THAT IS NOT CONTROLLED WITH YOUR NAUSEA MEDICATION  *UNUSUAL SHORTNESS OF BREATH  *UNUSUAL BRUISING OR BLEEDING  TENDERNESS IN MOUTH AND THROAT WITH OR WITHOUT PRESENCE OF ULCERS  *URINARY PROBLEMS  *BOWEL PROBLEMS  UNUSUAL RASH Items with * indicate a potential emergency and should be followed up as soon as possible.  Feel free to call the clinic you have any questions or concerns. The clinic phone number is [phone].  Please show the [place] at check-in to the Emergency Department and triage nurse.

## 2016-12-31 ENCOUNTER — Encounter (HOSPITAL_BASED_OUTPATIENT_CLINIC_OR_DEPARTMENT_OTHER): Payer: Self-pay | Admitting: *Deleted

## 2016-12-31 ENCOUNTER — Emergency Department (HOSPITAL_BASED_OUTPATIENT_CLINIC_OR_DEPARTMENT_OTHER): Payer: Self-pay

## 2016-12-31 ENCOUNTER — Emergency Department (HOSPITAL_BASED_OUTPATIENT_CLINIC_OR_DEPARTMENT_OTHER)
Admission: EM | Admit: 2016-12-31 | Discharge: 2016-12-31 | Disposition: A | Discharge status: Home / Self Care | Payer: Self-pay | Attending: Emergency Medicine | Admitting: Emergency Medicine

## 2016-12-31 DIAGNOSIS — R1013 Epigastric pain: Secondary | ICD-10-CM

## 2016-12-31 DIAGNOSIS — R1115 Cyclical vomiting syndrome unrelated to migraine: Secondary | ICD-10-CM

## 2016-12-31 DIAGNOSIS — R030 Elevated blood-pressure reading, without diagnosis of hypertension: Secondary | ICD-10-CM | POA: Insufficient documentation

## 2016-12-31 DIAGNOSIS — G43A0 Cyclical vomiting, not intractable: Secondary | ICD-10-CM | POA: Insufficient documentation

## 2016-12-31 DIAGNOSIS — R072 Precordial pain: Secondary | ICD-10-CM | POA: Insufficient documentation

## 2016-12-31 DIAGNOSIS — Z8571 Personal history of Hodgkin lymphoma: Secondary | ICD-10-CM | POA: Insufficient documentation

## 2016-12-31 DIAGNOSIS — Z79899 Other long term (current) drug therapy: Secondary | ICD-10-CM | POA: Insufficient documentation

## 2016-12-31 LAB — COMPREHENSIVE METABOLIC PANEL
ALK PHOS: 71 U/L (ref 38–126)
ALT: 21 U/L (ref 14–54)
AST: 23 U/L (ref 15–41)
Albumin: 4 g/dL (ref 3.5–5.0)
Anion gap: 10 (ref 5–15)
CALCIUM: 9.6 mg/dL (ref 8.9–10.3)
CO2: 25 mmol/L (ref 22–32)
CREATININE: 0.52 mg/dL (ref 0.44–1.00)
Chloride: 99 mmol/L — ABNORMAL LOW (ref 101–111)
GFR calc non Af Amer: >60 mL/min (ref >60)
GLUCOSE: 133 mg/dL — AB (ref 65–99)
Potassium: 3.3 mmol/L — ABNORMAL LOW (ref 3.5–5.1)
SODIUM: 134 mmol/L — AB (ref 135–145)
Total Bilirubin: 0.5 mg/dL (ref 0.3–1.2)
Total Protein: 7.5 g/dL (ref 6.5–8.1)

## 2016-12-31 LAB — CBC WITH DIFFERENTIAL/PLATELET
BASOS PCT: 1 %
Basophils Absolute: 0.1 10*3/uL (ref 0.0–0.1)
EOS ABS: 0.1 10*3/uL (ref 0.0–0.7)
EOS PCT: 1 %
HEMATOCRIT: 34.1 % — AB (ref 36.0–46.0)
HEMOGLOBIN: 12 g/dL (ref 12.0–15.0)
Lymphocytes Relative: 12 %
Lymphs Abs: 1.1 10*3/uL (ref 0.7–4.0)
MCH: 30.2 pg (ref 26.0–34.0)
MCHC: 35.2 g/dL (ref 30.0–36.0)
MCV: 85.9 fL (ref 78.0–100.0)
MONO ABS: 1.9 10*3/uL — AB (ref 0.1–1.0)
MONOS PCT: 21 %
NEUTROS PCT: 65 %
Neutro Abs: 5.9 10*3/uL (ref 1.7–7.7)
Platelets: 403 10*3/uL — ABNORMAL HIGH (ref 150–400)
RBC: 3.97 MIL/uL (ref 3.87–5.11)
RDW: 17.8 % — AB (ref 11.5–15.5)
WBC: 9.1 10*3/uL (ref 4.0–10.5)

## 2016-12-31 LAB — CK: Total CK: 66 U/L (ref 38–234)

## 2016-12-31 LAB — URINALYSIS, ROUTINE W REFLEX MICROSCOPIC
BILIRUBIN URINE: NEGATIVE
Glucose, UA: NEGATIVE mg/dL
Hgb urine dipstick: NEGATIVE
Ketones, ur: NEGATIVE mg/dL
Leukocytes, UA: NEGATIVE
NITRITE: NEGATIVE
PH: 6.5 (ref 5.0–8.0)
Protein, ur: NEGATIVE mg/dL
SPECIFIC GRAVITY, URINE: 1.005 (ref 1.005–1.030)

## 2016-12-31 LAB — LIPASE, BLOOD: Lipase: 24 U/L (ref 11–51)

## 2016-12-31 LAB — PREGNANCY, URINE: Preg Test, Ur: NEGATIVE

## 2016-12-31 MED ORDER — PANTOPRAZOLE SODIUM 40 MG IV SOLR
40.0000 mg | Freq: Once | INTRAVENOUS | Status: AC
  Administered 2016-12-31: 40 mg via INTRAVENOUS
  Filled 2016-12-31: qty 40

## 2016-12-31 MED ORDER — SODIUM CHLORIDE 0.9 % IV BOLUS (SEPSIS)
1000.0000 mL | Freq: Once | INTRAVENOUS | Status: AC
  Administered 2016-12-31: 1000 mL via INTRAVENOUS

## 2016-12-31 MED ORDER — LIDOCAINE 4 % EX CREA
TOPICAL_CREAM | Freq: Once | CUTANEOUS | Status: AC
  Administered 2016-12-31: 1 via TOPICAL

## 2016-12-31 MED ORDER — FENTANYL CITRATE (PF) 100 MCG/2ML IJ SOLN
50.0000 ug | Freq: Once | INTRAMUSCULAR | Status: AC
  Administered 2016-12-31: 50 ug via INTRAVENOUS
  Filled 2016-12-31: qty 2

## 2016-12-31 MED ORDER — ONDANSETRON HCL 4 MG/2ML IJ SOLN
4.0000 mg | Freq: Once | INTRAMUSCULAR | Status: AC
  Administered 2016-12-31: 4 mg via INTRAVENOUS
  Filled 2016-12-31: qty 2

## 2016-12-31 MED ORDER — HEPARIN SOD (PORK) LOCK FLUSH 100 UNIT/ML IV SOLN
500.0000 [IU] | Freq: Once | INTRAVENOUS | Status: AC
  Administered 2016-12-31: 500 [IU]
  Filled 2016-12-31: qty 5

## 2016-12-31 MED ORDER — GI COCKTAIL ~~LOC~~
30.0000 mL | Freq: Once | ORAL | Status: AC
  Administered 2016-12-31: 30 mL via ORAL
  Filled 2016-12-31: qty 30

## 2016-12-31 MED ORDER — LIDOCAINE 4 % EX CREA
TOPICAL_CREAM | CUTANEOUS | Status: AC
  Administered 2016-12-31: 1 via TOPICAL
  Filled 2016-12-31: qty 5

## 2016-12-31 NOTE — ED Notes (Signed)
EDP out of room. pCXR into room, at [facility]

## 2016-12-31 NOTE — ED Notes (Signed)
EDP into see/ update. Pt up to b/r with steady gait. Denies dizziness or nausea.

## 2016-12-31 NOTE — ED Notes (Signed)
EDP at [facility], pt given juice.

## 2016-12-31 NOTE — ED Provider Notes (Signed)
[facility] DEPT [facility] Provider Note   CSN: [id number] Arrival date & time: 12/31/16  0103     History   Chief Complaint Chief Complaint  Patient presents with  . Abdominal Pain    HPI Leah Floyd is a 26 y.o. female.  The history is provided by the patient and a parent.  Abdominal Pain   This is a recurrent problem. The current episode started more than 2 days ago. The problem occurs constantly. The problem has been gradually worsening. The pain is located in the epigastric region. The quality of the pain is burning. The pain is moderate. Associated symptoms include nausea and constipation. Pertinent negatives include fever. Nothing aggravates the symptoms. Nothing relieves the symptoms. Past medical history comments: lymphoma.  pt with h/o Hodgkin's Lymphoma She is currently on chemotherapy (last dose was 7/20) Over past several days she has had epigastric abdominal pain, body aches and nausea/vomiting She also reports epigastric burning into her chest  She feels some shortness of breath She also reports constipation She reports nausea/vomiting post chemo is unusual for her No fever is reported She has no h/o CAD/PE/DVT   Soc hx - She is a nonsmoker  Past Medical History:  Diagnosis Date  . [facility])    lymphoma     Patient Active Problem List   Diagnosis Date Noted  . Iron deficiency anemia due to chronic blood loss 10/12/2016  . Hodgkin's lymphoma ([place]) 10/10/2016    Past Surgical History:  Procedure Laterality Date  . ANTERIOR CRUCIATE LIGAMENT REPAIR     left  . HERNIA REPAIR     left  . IR FLUORO GUIDE PORT INSERTION RIGHT  10/04/2016  . IR US GUIDE VASC ACCESS RIGHT  10/04/2016  . TONSILLECTOMY      OB History    No data available       Home Medications    Prior to Admission medications   Medication Sig Start Date End Date Taking? Authorizing Provider  albuterol (PROVENTIL HFA;VENTOLIN HFA) 108 (90 Base) MCG/ACT inhaler Inhale 2 puffs  into the lungs every 6 (six) hours as needed for wheezing or shortness of breath. 10/03/16   Brunetta Genera, MD  Ascorbic Acid (VITAMIN C PO) Take by mouth.    [provider]  Cyanocobalamin (VITAMIN B-12 CR PO) Take 1 tablet by mouth daily.    [provider]  dexamethasone (DECADRON) 4 MG tablet Take 2 tablets by mouth once a day on the day after chemotherapy and then take 2 tablets two times a day for 2 days. Take with food. 12/07/16   Brunetta Genera, MD  diphenhydramine-acetaminophen (TYLENOL PM) 25-500 MG TABS tablet Take 2 tablets by mouth at bedtime as needed.    [provider]  EPINEPHrine 0.3 mg/0.3 mL IJ SOAJ injection Inject 0.3 mLs (0.3 mg total) into the muscle once. For anaphylactic reactions 10/03/16 10/03/16  Brunetta Genera, MD  HYDROcodone-acetaminophen (NORCO/VICODIN) 5-325 MG tablet Take 1-2 tablets by mouth every 6 (six) hours as needed for moderate pain or severe pain. 12/19/16   Brunetta Genera, MD  lidocaine-prilocaine (EMLA) cream Apply to affected area once 12/19/16   Brunetta Genera, MD  loratadine (CLARITIN) 10 MG tablet Take 10 mg by mouth daily.    [provider]  LORazepam (ATIVAN) 0.5 MG tablet Take 1 tablet (0.5 mg total) by mouth every 6 (six) hours as needed (Nausea or vomiting). 11/22/16   Brunetta Genera, MD  LORazepam (ATIVAN) 0.5 MG  tablet Take 1 tablet (0.5 mg total) by mouth every 6 (six) hours as needed (Nausea or vomiting). 11/22/16   Brunetta Genera, MD  magic mouthwash w/lidocaine SOLN Take 5 mLs by mouth 4 (four) times daily as needed for mouth pain. 12/19/16   Brunetta Genera, MD  nystatin (MYCOSTATIN) 100000 UNIT/ML suspension Take if developing thrush. If only oral patches -- swish and spit, if pain with swallowing -- swish and swallow 12/19/16   Brunetta Genera, MD  omeprazole (PRILOSEC) 20 MG capsule Take 20 mg by mouth daily.    [provider]  ondansetron (ZOFRAN) 8 MG  tablet Take 1 tablet (8 mg total) by mouth 2 (two) times daily as needed. Start on the third day after chemotherapy. 10/12/16   Brunetta Genera, MD  PEGFILGRASTIM [place] Apply 1 patch topically once.    [provider]  prochlorperazine (COMPAZINE) 10 MG tablet Take 1 tablet (10 mg total) by mouth every 6 (six) hours as needed (Nausea or vomiting). 11/22/16   Brunetta Genera, MD  senna-docusate (SENNA S) 8.6-50 MG tablet Take 2 tablets by mouth at bedtime. Hold if diarrhea ensues 11/22/16   Brunetta Genera, MD  sucralfate (CARAFATE) 1 GM/10ML suspension Take 10 mLs (1 g total) by mouth 4 (four) times daily -  with meals and at bedtime. 12/19/16   Brunetta Genera, MD    Family History No family history on file.  Social History Social History  Substance Use Topics  . Smoking status: Never Smoker  . Smokeless tobacco: Never Used  . Alcohol use Yes     Comment: socially     Allergies   Shellfish-derived products   Review of Systems Review of Systems  Constitutional: Positive for chills and fatigue. Negative for fever.  Respiratory: Positive for shortness of breath.   Cardiovascular: Positive for chest pain.  Gastrointestinal: Positive for abdominal pain, constipation and nausea.  All other systems reviewed and are negative.    Physical Exam Updated Vital Signs BP (!) 138/99 (BP Location: Right Arm)   Pulse 93   Temp 98.9 F (37.2 C) (Oral)   LMP 11/25/2016   SpO2 100%   Physical Exam CONSTITUTIONAL: Chronically ill appearing, mildly tachyneic HEAD: Normocephalic/atraumatic EYES: EOMI/PERRL ENMT: Mucous membranes moist NECK: supple no meningeal signs SPINE/BACK:entire spine nontender CV: S1/S2 noted, no murmurs/rubs/gallops noted LUNGS: Lungs are clear to auscultation bilaterally, no apparent distress ABDOMEN: soft, mild epigastric tenderness, no rebound or guarding, bowel sounds noted throughout abdomen GU:no cva tenderness NEURO: Pt is  awake/alert/appropriate, moves all extremitiesx4.  No facial droop.   EXTREMITIES: pulses normal/equal, full ROM SKIN: warm, color normal, port noted in right chest, no erythema/tenderness noted PSYCH: no abnormalities of mood noted, alert and oriented to situation   ED Treatments / Results  Labs (all labs ordered are listed, but only abnormal results are displayed) Labs Reviewed  COMPREHENSIVE METABOLIC PANEL - Abnormal; Notable for the following:       Result Value   Sodium 134 (*)    Potassium 3.3 (*)    Chloride 99 (*)    Glucose, Bld 133 (*)    BUN <5 (*)    All other components within normal limits  CBC WITH DIFFERENTIAL/PLATELET - Abnormal; Notable for the following:    HCT 34.1 (*)    RDW 17.8 (*)    Platelets 403 (*)    Monocytes Absolute 1.9 (*)    All other components within normal limits  URINALYSIS, ROUTINE W  REFLEX MICROSCOPIC  PREGNANCY, URINE  LIPASE, BLOOD  CK    EKG  EKG Interpretation  Date/Time:  Monday December 31 2016 03:21:58 EDT Ventricular Rate:  72 PR Interval:    QRS Duration: 95 QT Interval:  394 QTC Calculation: 432 R Axis:   36 Text Interpretation:  Sinus rhythm No previous ECGs available Confirmed by Ripley Fraise [phone]) on 12/31/2016 3:37:49 AM       Radiology Dg Chest Portable 1 View  Result Date: 12/31/2016 CLINICAL DATA:  Shortness of breath and fatigue for 2 days. Recent chemotherapy for new diagnosis Hodgkin's lymphoma. EXAM: PORTABLE CHEST 1 VIEW COMPARISON:  PET-CT 10/10/2016 FINDINGS: Power port type central venous catheter with tip over the low SVC region. No pneumothorax. Normal heart size and pulmonary vascularity. No focal airspace disease or consolidation in the lungs. No blunting of costophrenic angles. No pneumothorax. Mediastinal contours appear intact. IMPRESSION: No evidence of active pulmonary disease. Electronically Signed   By: Lucienne Capers M.D.   On: 12/31/2016 02:31    Procedures Procedures    Medications  Ordered in ED Medications  sodium chloride 0.9 % bolus 1,000 mL (1,000 mLs Intravenous New Bag/Given 12/31/16 0302)  ondansetron (ZOFRAN) injection 4 mg (4 mg Intravenous Given 12/31/16 0303)  fentaNYL (SUBLIMAZE) injection 50 mcg (50 mcg Intravenous Given 12/31/16 0303)  gi cocktail (Maalox,Lidocaine,Donnatal) (30 mLs Oral Given 12/31/16 0223)  pantoprazole (PROTONIX) injection 40 mg (40 mg Intravenous Given 12/31/16 0303)  lidocaine (LMX) 4 % cream (1 application Topical Given 12/31/16 0234)     Initial Impression / Assessment and Plan / ED Course  I have reviewed the triage vital signs and the nursing notes.  Pertinent labs & imaging results that were available during my care of the patient were reviewed by me and considered in my medical decision making (see chart for details).     2:21 AM Pt with h/o lymphoma here with multiple complaints but main concern nausea/vomiting and epigastric burning She would like to try GI cocktail Due to h/o CA will perform labs/imaging and treat pain Pt agreeable  4:26 AM Overall pt is improved She is awake/alert She is NOT septic appearing She is not vomiting now She feels improved Labs essentially near baseline She has had similar ED visits in the past My suspicion for acute abdominal emergency is low I doubt ACS/PE at this time (one pulse ox 77% was error per nursing) She does have elevated BP but no signs of hypertensive emergency Advised this needs to be monitored and she may need therapy as outpatient if this continues  4:48 AM Pt taking PO Looks well Will d/c home Discussed return precautions Advised need for close monitoring of her BP as outpatient   Final Clinical Impressions(s) / ED Diagnoses   Final diagnoses:  Non-intractable cyclical vomiting with nausea  Epigastric abdominal pain  Precordial pain  Elevated blood pressure reading    New Prescriptions New Prescriptions   No medications on file     Ripley Fraise,  MD 12/31/16 [phone]

## 2016-12-31 NOTE — ED Notes (Signed)
EDP into room, prior to RN assessment, see MD notes, pending orders.   

## 2016-12-31 NOTE — ED Triage Notes (Signed)
Pt states that she has not been feeling well since Friday. C/o n/v/ and constipation. Also c/o heartburn since yesterday as well. C/o left upper abd pain. States pain comes and goes. Describes as sharp. Took a laxative with liquid stools. Decreased appetite and feeling weak. Last chemo was on July 20th. This is a new chemo for her. Has not made contact with her MD. States she is feeling sob.

## 2016-12-31 NOTE — Discharge Instructions (Signed)

## 2016-12-31 NOTE — ED Notes (Signed)
EDP into room, updated.

## 2017-01-02 ENCOUNTER — Ambulatory Visit (HOSPITAL_COMMUNITY)
Admission: RE | Admit: 2017-01-02 | Discharge: 2017-01-02 | Disposition: A | Discharge status: Home / Self Care | Payer: Self-pay | Source: Ambulatory Visit | Attending: Hematology | Admitting: Hematology

## 2017-01-02 DIAGNOSIS — C8198 Hodgkin lymphoma, unspecified, lymph nodes of multiple sites: Secondary | ICD-10-CM | POA: Insufficient documentation

## 2017-01-02 DIAGNOSIS — R918 Other nonspecific abnormal finding of lung field: Secondary | ICD-10-CM | POA: Insufficient documentation

## 2017-01-02 DIAGNOSIS — R590 Localized enlarged lymph nodes: Secondary | ICD-10-CM | POA: Insufficient documentation

## 2017-01-02 LAB — GLUCOSE, CAPILLARY: Glucose-Capillary: 103 mg/dL — ABNORMAL HIGH (ref 65–99)

## 2017-01-02 MED ORDER — FLUDEOXYGLUCOSE F - 18 (FDG) INJECTION
8.2200 | Freq: Once | INTRAVENOUS | Status: AC | PRN
  Administered 2017-01-02: 8.22 via INTRAVENOUS

## 2017-01-04 ENCOUNTER — Other Ambulatory Visit (HOSPITAL_BASED_OUTPATIENT_CLINIC_OR_DEPARTMENT_OTHER): Payer: Self-pay

## 2017-01-04 ENCOUNTER — Ambulatory Visit (HOSPITAL_BASED_OUTPATIENT_CLINIC_OR_DEPARTMENT_OTHER): Payer: Self-pay | Admitting: Hematology

## 2017-01-04 ENCOUNTER — Ambulatory Visit (HOSPITAL_BASED_OUTPATIENT_CLINIC_OR_DEPARTMENT_OTHER): Payer: Self-pay

## 2017-01-04 VITALS — BP 129/101 | HR 89 | Temp 98.9°F | Resp 20 | Ht 61.0 in | Wt 166.0 lb

## 2017-01-04 DIAGNOSIS — C8194 Hodgkin lymphoma, unspecified, lymph nodes of axilla and upper limb: Secondary | ICD-10-CM

## 2017-01-04 DIAGNOSIS — Z5111 Encounter for antineoplastic chemotherapy: Secondary | ICD-10-CM

## 2017-01-04 DIAGNOSIS — C8198 Hodgkin lymphoma, unspecified, lymph nodes of multiple sites: Secondary | ICD-10-CM

## 2017-01-04 DIAGNOSIS — T451X5A Adverse effect of antineoplastic and immunosuppressive drugs, initial encounter: Secondary | ICD-10-CM

## 2017-01-04 DIAGNOSIS — D509 Iron deficiency anemia, unspecified: Secondary | ICD-10-CM

## 2017-01-04 DIAGNOSIS — Z5189 Encounter for other specified aftercare: Secondary | ICD-10-CM

## 2017-01-04 DIAGNOSIS — Z5112 Encounter for antineoplastic immunotherapy: Secondary | ICD-10-CM

## 2017-01-04 DIAGNOSIS — K219 Gastro-esophageal reflux disease without esophagitis: Secondary | ICD-10-CM

## 2017-01-04 DIAGNOSIS — G620 Drug-induced polyneuropathy: Secondary | ICD-10-CM

## 2017-01-04 LAB — CBC WITH DIFFERENTIAL/PLATELET
BASO%: 1.4 % (ref 0.0–2.0)
Basophils Absolute: 0.1 10*3/uL (ref 0.0–0.1)
EOS ABS: 0.3 10*3/uL (ref 0.0–0.5)
EOS%: 2.7 % (ref 0.0–7.0)
HCT: 38.4 % (ref 34.8–46.6)
HGB: 13.1 g/dL (ref 11.6–15.9)
LYMPH%: 9.4 % — AB (ref 14.0–49.7)
MCH: 30.3 pg (ref 25.1–34.0)
MCHC: 34 g/dL (ref 31.5–36.0)
MCV: 89 fL (ref 79.5–101.0)
MONO#: 1.1 10*3/uL — ABNORMAL HIGH (ref 0.1–0.9)
MONO%: 11.3 % (ref 0.0–14.0)
NEUT%: 75.2 % (ref 38.4–76.8)
NEUTROS ABS: 7.6 10*3/uL — AB (ref 1.5–6.5)
Platelets: 319 10*3/uL (ref 145–400)
RBC: 4.32 10*6/uL (ref 3.70–5.45)
RDW: 20.1 % — ABNORMAL HIGH (ref 11.2–14.5)
WBC: 10.1 10*3/uL (ref 3.9–10.3)
lymph#: 1 10*3/uL (ref 0.9–3.3)

## 2017-01-04 LAB — COMPREHENSIVE METABOLIC PANEL
ALT: 18 U/L (ref 0–55)
AST: 23 U/L (ref 5–34)
Albumin: 3.7 g/dL (ref 3.5–5.0)
Alkaline Phosphatase: 74 U/L (ref 40–150)
Anion Gap: 7 mEq/L (ref 3–11)
BILIRUBIN TOTAL: 0.25 mg/dL (ref 0.20–1.20)
BUN: 3.1 mg/dL — ABNORMAL LOW (ref 7.0–26.0)
CO2: 28 meq/L (ref 22–29)
Calcium: 10 mg/dL (ref 8.4–10.4)
Chloride: 101 mEq/L (ref 98–109)
Creatinine: 0.8 mg/dL (ref 0.6–1.1)
GLUCOSE: 95 mg/dL (ref 70–140)
Potassium: 3.8 mEq/L (ref 3.5–5.1)
SODIUM: 137 meq/L (ref 136–145)
TOTAL PROTEIN: 7.2 g/dL (ref 6.4–8.3)

## 2017-01-04 LAB — FERRITIN: FERRITIN: 281 ng/mL — AB (ref 9–269)

## 2017-01-04 MED ORDER — SODIUM CHLORIDE 0.9 % IV SOLN
Freq: Once | INTRAVENOUS | Status: AC
  Administered 2017-01-04: 13:00:00 via INTRAVENOUS
  Filled 2017-01-04: qty 5

## 2017-01-04 MED ORDER — SODIUM CHLORIDE 0.9 % IV SOLN
0.9000 mg/kg | Freq: Once | INTRAVENOUS | Status: AC
  Administered 2017-01-04: 65 mg via INTRAVENOUS
  Filled 2017-01-04: qty 13

## 2017-01-04 MED ORDER — HEPARIN SOD (PORK) LOCK FLUSH 100 UNIT/ML IV SOLN
500.0000 [IU] | Freq: Once | INTRAVENOUS | Status: AC | PRN
  Administered 2017-01-04: 500 [IU]
  Filled 2017-01-04: qty 5

## 2017-01-04 MED ORDER — SODIUM CHLORIDE 0.9% FLUSH
10.0000 mL | INTRAVENOUS | Status: DC | PRN
  Administered 2017-01-04: 10 mL
  Filled 2017-01-04: qty 10

## 2017-01-04 MED ORDER — PEGFILGRASTIM 6 MG/0.6ML ~~LOC~~ PSKT
6.0000 mg | PREFILLED_SYRINGE | Freq: Once | SUBCUTANEOUS | Status: AC
  Administered 2017-01-04: 6 mg via SUBCUTANEOUS
  Filled 2017-01-04: qty 0.6

## 2017-01-04 MED ORDER — SODIUM CHLORIDE 0.9 % IV SOLN
Freq: Once | INTRAVENOUS | Status: AC
  Administered 2017-01-04: 12:00:00 via INTRAVENOUS

## 2017-01-04 MED ORDER — DOXORUBICIN HCL CHEMO IV INJECTION 2 MG/ML
25.0000 mg/m2 | Freq: Once | INTRAVENOUS | Status: AC
  Administered 2017-01-04: 44 mg via INTRAVENOUS
  Filled 2017-01-04: qty 22

## 2017-01-04 MED ORDER — DULOXETINE HCL 30 MG PO CPEP: 30.0000 mg | ORAL_CAPSULE | Freq: Every day | ORAL | 1 refills | Status: DC

## 2017-01-04 MED ORDER — VINBLASTINE SULFATE CHEMO INJECTION 1 MG/ML
5.6000 mg/m2 | Freq: Once | INTRAVENOUS | Status: AC
  Administered 2017-01-04: 10 mg via INTRAVENOUS
  Filled 2017-01-04: qty 10

## 2017-01-04 MED ORDER — PANTOPRAZOLE SODIUM 40 MG PO TBEC: 40.0000 mg | DELAYED_RELEASE_TABLET | Freq: Every day | ORAL | 1 refills | Status: DC

## 2017-01-04 MED ORDER — SODIUM CHLORIDE 0.9 % IV SOLN
375.0000 mg/m2 | Freq: Once | INTRAVENOUS | Status: AC
  Administered 2017-01-04: 660 mg via INTRAVENOUS
  Filled 2017-01-04: qty 33

## 2017-01-04 MED ORDER — PALONOSETRON HCL INJECTION 0.25 MG/5ML
0.2500 mg | Freq: Once | INTRAVENOUS | Status: AC
  Administered 2017-01-04: 0.25 mg via INTRAVENOUS

## 2017-01-04 MED ORDER — PALONOSETRON HCL INJECTION 0.25 MG/5ML
INTRAVENOUS | Status: AC
  Filled 2017-01-04: qty 5

## 2017-01-04 MED FILL — PANTOPRAZOLE SOD DR 40 MG T: 40 | 30 days supply | Qty: 30 | Fill #0

## 2017-01-04 MED FILL — DULoxetine HCL 30 MG CPEP: 30 | 30 days supply | Qty: 30 | Fill #0

## 2017-01-04 NOTE — Patient Instructions (Signed)
[facility] Discharge Instructions for Patients Receiving Chemotherapy  Today you received the following chemotherapy agents ABVD  To help prevent nausea and vomiting after your treatment, we encourage you to take your nausea medication as needed   If you develop nausea and vomiting that is not controlled by your nausea medication, call the clinic.   BELOW ARE SYMPTOMS THAT SHOULD BE REPORTED IMMEDIATELY:  *FEVER GREATER THAN 100.5 F  *CHILLS WITH OR WITHOUT FEVER  NAUSEA AND VOMITING THAT IS NOT CONTROLLED WITH YOUR NAUSEA MEDICATION  *UNUSUAL SHORTNESS OF BREATH  *UNUSUAL BRUISING OR BLEEDING  TENDERNESS IN MOUTH AND THROAT WITH OR WITHOUT PRESENCE OF ULCERS  *URINARY PROBLEMS  *BOWEL PROBLEMS  UNUSUAL RASH Items with * indicate a potential emergency and should be followed up as soon as possible.  Feel free to call the clinic you have any questions or concerns. The clinic phone number is [phone].  Please show the [facility] at check-in to the Emergency Department and triage nurse.

## 2017-01-05 LAB — SEDIMENTATION RATE: Sedimentation Rate-Westergren: 15 mm/hr (ref 0–32)

## 2017-01-08 ENCOUNTER — Other Ambulatory Visit: Payer: Self-pay | Admitting: Hematology

## 2017-01-08 ENCOUNTER — Telehealth: Payer: Self-pay | Admitting: *Deleted

## 2017-01-08 DIAGNOSIS — R530 Neoplastic (malignant) related fatigue: Secondary | ICD-10-CM

## 2017-01-08 DIAGNOSIS — C8198 Hodgkin lymphoma, unspecified, lymph nodes of multiple sites: Secondary | ICD-10-CM

## 2017-01-08 NOTE — Telephone Encounter (Signed)
Per Dr. Irene Limbo, LVM with patient informing of referral to cancer rehab for help with fatigue/myalgias.  Message sent to scheduling for referral.

## 2017-01-14 NOTE — Progress Notes (Signed)
[name]    HEMATOLOGY/ONCOLOGY CLINIC NOTE  Date of Service: .01/04/2017  Patient Care Team: Brunetta Genera, MD as PCP - General (Hematology) Patient, No Pcp Per (General Practice)  CHIEF COMPLAINTS/PURPOSE OF CONSULTATION:  Newly diagnosed classical Hodgkin's lymphoma  HISTORY OF PRESENTING ILLNESS:   Leah Floyd is a wonderful 26 y.o. female who has been referred to [place] by [employer] [facility] for evaluation and management of newly diagnosed classical Hodgkin's lymphoma.  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  She notes that in November 2017 she developed generalized body aches and flulike symptoms with some subjective fevers chills and night sweats. A primary care physician noted a right axillary lump and she was treated empirically with Z-Pak for possible infection. She notes that around Christmas time in 2017 she developed worsening fatigue and generalized body aches a point that she was having difficulties getting through the day. She notes that she had an autoimmune workup HIV testing which was unrevealing she was noted to have some iron deficiency. She notes that she had a follow-up ultrasound that showed right axillary lymph node was enlarging and she also had a left axillary lymph node and had noted inguinal lymph nodes in the groin. Some of her workup was delayed due to insurance issues that she eventually had a left axillary lymph node biopsy on 09/24/2016 accession number [id number] which showed classical Hodgkin's lymphoma.  Patient then has moved back from [place] to be with her mother and family in [place] not, and is here to seek further cares for her classical Hodgkin's lymphoma newly diagnosed.  Patient notes significant fatigue, night sweats, weight loss of about 40  pounds in the last 4-5 months, generalized body aches, dry cough and loss of appetite.  She notes that she previously had heavy menstrual periods lasting 7 days which are now become lighter and last about 4 days. She has been taking ferrous sulfate plus vitamin C for her iron deficiency anemia.  No previous heart problems. Has been having some mild diarrhea which she describes as soft stools 2-3 times a day.  INTERVAL HISTORY  Leah Floyd is here with her mother  for a followup prior to C4 D1 of her chemotherapy. She notes some mild grade 1-mucositis and grade 2 fatigue. 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 We discussed her symptoms and she was in favor of dose reducing her Brentuximab. PET/CT was done and the images and results showing excellent response were discussed in details with her. She also appears to have some emotional difficulty coping with the stress of diagnosis and chemotherapy. Started on Cymbalta 30mg  po daily. Offered to give referral to behavioral health -- patient declined this currently. Has vicodin prn for bodyaches. No fevers or chills no night sweats.   MEDICAL HISTORY:   #1 iron deficiency anemia #2 GERD #3 obesity. #4 pseudotumor cerebri however this has resolved with her weight loss as per patient. #5 asthma #6 of newly diagnosed Hodgkin's lymphoma #7 shellfish allergy #8 history of Chlamydia infection   SURGICAL HISTORY: Past Surgical History:  Procedure Laterality Date  . ANTERIOR CRUCIATE LIGAMENT REPAIR     left  . HERNIA REPAIR     left  . IR FLUORO GUIDE PORT INSERTION RIGHT  10/04/2016  . IR US GUIDE VASC ACCESS RIGHT  10/04/2016  . TONSILLECTOMY  SOCIAL HISTORY: Social History   Social History  . Marital status: Single    Spouse name: N/A  . Number of children: N/A  . Years of education: N/A   Occupational History  . Not on file.   Social History Main Topics  . Smoking status: Never Smoker  .  Smokeless tobacco: Never Used  . Alcohol use Yes     Comment: socially  . Drug use: Yes    Types: Marijuana     Comment: medical   . Sexual activity: Not on file   Other Topics Concern  . Not on file   Social History Narrative  . No narrative on file    FAMILY HISTORY: Notes her first cousin on her mom's side had some form off rare bone marrow cancer Does not know history on her father's side of the family Maternal grandmother-hypertension, diabetes, fibromyalgia Mother anemia Other relatives on her mom's side with lymphoma and bladder cancer.  ALLERGIES:  is allergic to shellfish-derived products.  MEDICATIONS:  Current Outpatient Prescriptions  Medication Sig Dispense Refill  . albuterol (PROVENTIL HFA;VENTOLIN HFA) 108 (90 Base) MCG/ACT inhaler Inhale 2 puffs into the lungs every 6 (six) hours as needed for wheezing or shortness of breath. 1 Inhaler 2  . Ascorbic Acid (VITAMIN C PO) Take by mouth.    . Cyanocobalamin (VITAMIN B-12 CR PO) Take 1 tablet by mouth daily.    [name] dexamethasone (DECADRON) 4 MG tablet Take 2 tablets by mouth once a day on the day after chemotherapy and then take 2 tablets two times a day for 2 days. Take with food. 30 tablet 1  . diphenhydramine-acetaminophen (TYLENOL PM) 25-500 MG TABS tablet Take 2 tablets by mouth at bedtime as needed.    . DULoxetine (CYMBALTA) 30 MG capsule Take 1 capsule (30 mg total) by mouth daily. 30 capsule 1  . EPINEPHrine 0.3 mg/0.3 mL IJ SOAJ injection Inject 0.3 mLs (0.3 mg total) into the muscle once. For anaphylactic reactions 1 Device 0  . HYDROcodone-acetaminophen (NORCO/VICODIN) 5-325 MG tablet Take 1-2 tablets by mouth every 6 (six) hours as needed for moderate pain or severe pain. 30 tablet 0  . lidocaine-prilocaine (EMLA) cream Apply to affected area once 30 g 3  . loratadine (CLARITIN) 10 MG tablet Take 10 mg by mouth daily.    [name] LORazepam (ATIVAN) 0.5 MG tablet Take 1 tablet (0.5 mg total) by mouth every 6 (six)  hours as needed (Nausea or vomiting). 30 tablet 0  . LORazepam (ATIVAN) 0.5 MG tablet Take 1 tablet (0.5 mg total) by mouth every 6 (six) hours as needed (Nausea or vomiting). 30 tablet 0  . magic mouthwash w/lidocaine SOLN Take 5 mLs by mouth 4 (four) times daily as needed for mouth pain. 120 mL 0  . ondansetron (ZOFRAN) 8 MG tablet Take 1 tablet (8 mg total) by mouth 2 (two) times daily as needed. Start on the third day after chemotherapy. 30 tablet 1  . pantoprazole (PROTONIX) 40 MG tablet Take 1 tablet (40 mg total) by mouth daily. 30 tablet 1  . PEGFILGRASTIM Boron Apply 1 patch topically once.    . prochlorperazine (COMPAZINE) 10 MG tablet Take 1 tablet (10 mg total) by mouth every 6 (six) hours as needed (Nausea or vomiting). 30 tablet 1  . senna-docusate (SENNA S) 8.6-50 MG tablet Take 2 tablets by mouth at bedtime. Hold if diarrhea ensues 60 tablet 1  . sucralfate (CARAFATE) 1 GM/10ML suspension Take 10 mLs (1 g total)  by mouth 4 (four) times daily -  with meals and at bedtime. 420 mL 0   No current facility-administered medications for this visit.     REVIEW OF SYSTEMS:    10 Point review of Systems was done is negative except as noted above.  PHYSICAL EXAMINATION: ECOG PERFORMANCE STATUS: 1 - Symptomatic but completely ambulatory  . Vitals:   01/04/17 1118  BP: (!) 129/101  Pulse: 89  Resp: 20  Temp: 98.9 F (37.2 C)  SpO2: 99%   Filed Weights   01/04/17 1118  Weight: 166 lb (75.3 kg)   .Body mass index is 31.37 kg/m.  GENERAL:alert, in no acute distress and comfortable SKIN: no acute rashes, no significant lesions EYES: conjunctiva are pink and non-injected, sclera anicteric OROPHARYNX: MMM, no exudates, no oropharyngeal erythema or ulceration NECK: supple, no JVD LYMPH:  Patient has palpable lymphadenopathy bilaterally in her neck , occipital area, supraclavicular, bilateral axillary and inguinal areas  LUNGS: clear to auscultation b/l with normal respiratory  effort HEART: regular rate & rhythm ABDOMEN:  normoactive bowel sounds , non tender, not distended. Borderline palpable splenomegaly, no palpable hepatomegaly. Extremity: no pedal edema PSYCH: alert & oriented x 3 with fluent speech NEURO: no focal motor/sensory deficits  LABORATORY DATA:  I have reviewed the data as listed  . CBC Latest Ref Rng & Units 01/04/2017 12/31/2016 12/19/2016  WBC 3.9 - 10.3 10e3/uL 10.1 9.1 6.8  Hemoglobin 11.6 - 15.9 g/dL 13.1 12.0 11.7  Hematocrit 34.8 - 46.6 % 38.4 34.1(L) 35.1  Platelets 145 - 400 10e3/uL 319 403(H) 274   ANC 4.7k . CBC    Component Value Date/Time   WBC 10.1 01/04/2017 1204   WBC 9.1 12/31/2016 0300   RBC 4.32 01/04/2017 1204   RBC 3.97 12/31/2016 0300   HGB 13.1 01/04/2017 1204   HCT 38.4 01/04/2017 1204   PLT 319 01/04/2017 1204   MCV 89.0 01/04/2017 1204   MCH 30.3 01/04/2017 1204   MCH 30.2 12/31/2016 0300   MCHC 34.0 01/04/2017 1204   MCHC 35.2 12/31/2016 0300   RDW 20.1 (H) 01/04/2017 1204   LYMPHSABS 1.0 01/04/2017 1204   MONOABS 1.1 (H) 01/04/2017 1204   EOSABS 0.3 01/04/2017 1204   BASOSABS 0.1 01/04/2017 1204    . CMP Latest Ref Rng & Units 01/04/2017 12/31/2016 12/19/2016  Glucose 70 - 140 mg/dl 95 133(H) 102  BUN 7.0 - 26.0 mg/dL 3.1(L) <5(L) 3.5(L)  Creatinine 0.6 - 1.1 mg/dL 0.8 0.52 0.7  Sodium 136 - 145 mEq/L 137 134(L) 139  Potassium 3.5 - 5.1 mEq/L 3.8 3.3(L) 4.1  Chloride 101 - 111 mmol/L - 99(L) -  CO2 22 - 29 mEq/L 28 25 25   Calcium 8.4 - 10.4 mg/dL 10.0 9.6 9.0  Total Protein 6.4 - 8.3 g/dL 7.2 7.5 6.3(L)  Total Bilirubin 0.20 - 1.20 mg/dL 0.25 0.5 <0.22  Alkaline Phos 40 - 150 U/L 74 71 71  AST 5 - 34 U/L 23 23 14   ALT 0 - 55 U/L 18 21 14    .  RADIOGRAPHIC STUDIES: I have personally reviewed the radiological images as listed and agreed with the findings in the report. Nm Pet Image Restag (ps) Skull Base To Thigh  Result Date: 01/02/2017 CLINICAL DATA:  Subsequent treatment strategy for  Hodgkin's lymphoma. EXAM: NUCLEAR MEDICINE PET SKULL BASE TO THIGH TECHNIQUE: 8.2 mCi F-18 FDG was injected intravenously. Full-ring PET imaging was performed from the skull base to thigh after the radiotracer. CT data was obtained  and used for attenuation correction and anatomic localization. FASTING BLOOD GLUCOSE:  Value: 103 mg/dl COMPARISON:  PET-CT 10/10/2016 FINDINGS: NECK Reduction in metabolic activity of lymph node within a LEFT parotid gland tissue with no residual activity above mediastinal activity. CHEST Marked reduction in size and metabolic activity of bilateral axillary lymph nodes. The remaining lymph nodes have metabolic activity of similar to mediastinal activity (SUV max equal 2.6) decreased from 8.0. Likewise the prevascular adenopathy has resolved with metabolic activity with activity equal to mediastinal activity. No suspicious pulmonary nodules. There is mild ground-glass opacities in the upper lobes with mild metabolic activity with SUV max equal 3.6. ABDOMEN/PELVIS Bulky retroperitoneal periaortic adenopathy is much reduced in size and metabolic activity. The periaortic lymph nodes are 10 mm or less with metabolic activity similar to or less than mediastinal activity. Individual lymph nodes are difficult to define. LEFT periaortic node measuring 1.7 SUV max decreased from 9.0. In the pelvis likewise reduction in bulky iliac lymphadenopathy. The LEFT external iliac lymph nodes are mildly enlarged 1.2 cm with SUV max equal 2.7 decreased from 11.5. Moderate activity associated with inguinal lymph nodes. Most intense in the LEFT superficial inguinal node with SUV max equal 5.0 which is moderately above liver activity (SUV max 3.7). This activity is new from prior. This lymph node is normal size. (Image 25 of fused data set). Potential reactive adenopathy Normal spleen. SKELETON There is diffuse increase in marrow activity favored to represent GCSF type response. IMPRESSION: 1. Marked reduction  in size and metabolic activity of axillary adenopathy, mediastinal adenopathy, and periaortic retroperitoneal adenopathy. Activity of these nodes is similar to mediastinal activity and less than liver activity ( Deauville 2). 2. Decreased in size and metabolic activity of bilateral iliac lymphadenopathy with enlarged lymph nodes remaining ( Deauville 2). 3. Most intense activity remains within LEFT inguinal lymph node with activity above liver activity Deauville 4. However, this lymph node was not metabolic on comparison examination and is normal size. Potential reactive node. 4. Increase in marrow activity is favored GCSF type response. 5. Mild metabolic activity associated ground-glass opacities in the upper lobes is favored infectious or inflammatory. Correlate with drug reaction / toxicity. These results will be called to the ordering clinician or representative by the Radiologist Assistant, and communication documented in the PACS or zVision Dashboard. Electronically Signed   By: Suzy Bouchard M.D.   On: 01/02/2017 16:58   Dg Chest Portable 1 View  Result Date: 12/31/2016 CLINICAL DATA:  Shortness of breath and fatigue for 2 days. Recent chemotherapy for new diagnosis Hodgkin's lymphoma. EXAM: PORTABLE CHEST 1 VIEW COMPARISON:  PET-CT 10/10/2016 FINDINGS: Power port type central venous catheter with tip over the low SVC region. No pneumothorax. Normal heart size and pulmonary vascularity. No focal airspace disease or consolidation in the lungs. No blunting of costophrenic angles. No pneumothorax. Mediastinal contours appear intact. IMPRESSION: No evidence of active pulmonary disease. Electronically Signed   By: Lucienne Capers M.D.   On: 12/31/2016 02:31    ASSESSMENT & PLAN:   26 year old female with   #1 Classical Hodgkin's lymphoma Stage IVB as per PET/CT Patient had constitutional symptoms with significant weight loss of 40 pounds, and night sweats and debilitating fatigue which has  significantly improved.  PET/CT after 3 cycles of treatment with excellent response thus far.  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 -will dose reduction Brentuximab  to 0.9mg /kg -vicodin prnfor pain -started on Cymbalta 30mg  po daily -given referral to rehab for strength and endurance and to help with fatigue. -discussed importance of maintain good pofood intake.   #4 Grade 1-2 oral mucositis and oral thrush . now resolved status post use of nystatin . Plan  - oral cryotherapy while getting chemotherapy - patient was counseled on this . - given refills for Magic mouthwash when necessary  - g-Given refill of Vicodin for significant Neulasta associated bone pains despite use of Claritin.  #3 history of iron deficiency anemia Has been on oral iron that causes significant constipation and is still significantly anemic. S/p IV Injectafer 750mg  po x 1 dose today. hgb has improved. . Lab Results  Component Value Date   IRON 26 (L) 10/02/2016   TIBC 349 10/02/2016   IRONPCTSAT 7 (L) 10/02/2016   (Iron and TIBC)  Lab Results  Component Value Date   FERRITIN 281 (H) 01/04/2017     plan  - ferritin adequate. No indication for additional iron replacement at this time  #4 history of asthma. This has been mild but tends to get worse in [place]. 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 Patient notes that she tolerates eating shrimp. -Prescribed EpiPen for when necessary use. -Recommended also to carry Benadryl.  #5 general medical cares -Patient was recommended to establish a primary care physician as soon as possible.   -Continue treatment as per schedule -RTC with Dr Irene Limbo in 2 weeks with labs  I spent 30 minutes counseling the patient face to face. The total time spent in the  appointment was 40 minutes and more than 50% was on counseling and direct patient cares.    Sullivan Lone MD Brandt AAHIVMS [facility] Hematology/Oncology Physician [facility]  (Office):       [phone] (Work cell):  [phone] (Fax):           [phone]

## 2017-01-16 ENCOUNTER — Ambulatory Visit: Payer: Self-pay | Attending: Hematology | Admitting: Physical Therapy

## 2017-01-16 DIAGNOSIS — R29898 Other symptoms and signs involving the musculoskeletal system: Secondary | ICD-10-CM | POA: Insufficient documentation

## 2017-01-16 DIAGNOSIS — M546 Pain in thoracic spine: Secondary | ICD-10-CM | POA: Insufficient documentation

## 2017-01-16 DIAGNOSIS — R262 Difficulty in walking, not elsewhere classified: Secondary | ICD-10-CM | POA: Insufficient documentation

## 2017-01-16 NOTE — Therapy (Signed)
[place], [place], [address] Phone: [phone]   Fax:  [phone]  Physical Therapy Evaluation  Patient Details  Name: Leah Floyd MRN: [id number] Date of Birth: 09/24/1990 Referring Provider: Dr. Sullivan Lone  Encounter Date: 01/16/2017      PT End of Session - 01/16/17 1253    Visit Number 1   Number of Visits 17   Date for PT Re-Evaluation 01/16/17   PT Start Time 1751   PT Stop Time 1106   PT Time Calculation (min) 51 min   Activity Tolerance Patient tolerated treatment well   Behavior During Therapy [facility] for tasks assessed/performed      Past Medical History:  Diagnosis Date  . Cancer [facility])    lymphoma     Past Surgical History:  Procedure Laterality Date  . ANTERIOR CRUCIATE LIGAMENT REPAIR     left  . HERNIA REPAIR     left  . IR FLUORO GUIDE PORT INSERTION RIGHT  10/04/2016  . IR US GUIDE VASC ACCESS RIGHT  10/04/2016  . TONSILLECTOMY      There were no vitals filed for this visit.       Subjective Assessment - 01/16/17 1019    Subjective My doctor sent me because I'm having neuropathy and a lot of fatigue so trying to get help with that.  I agree. 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 Was walking 5-6 miles a day in [place] she can walk about 10 minutes now, then had leg pain and shortness of breath.    Pertinent History Diagnosed with Hodgkin's lymphoma 09/21/16. Was living in [place] before that and had gotten sick in November, but was thought to have an infection or flu; large lymph node went away but a month later came back.  Was thought to have autoimmune disease.  Then insurance ran out.  Had a number of lumps at axillae and groin and was told she might have cancer. Biopsy showed cancer and she moved back here.  Started chemo soon after that and is still on chemo.  Was diagnosed at stage IV. Was in school in [place] and then online, bt that became too stressful.  Has  chemo every two weeks; feels bad right away after chemo for a week+, then has 3-4 good days before the next chemo. Is at round8 of 12 now. Had PET scan two weeks ago that showed 90% improvement so is still on chemo. Will have her next chemo 01/18/17.  Body has been achy this time.  No other health issues.  Had torn left ACL in 2010 and had surgery for that; recovered enough to go back to basketball.   Patient Stated Goals Feel a little better, more energy, less pain.   Currently in Pain? Yes   Pain Score 5    Pain Location Back   Pain Orientation Upper;Mid  between shoulder blades   Pain Descriptors / Indicators Pressure   Aggravating Factors  nothing   Pain Relieving Factors nothing--has tried stretching, using a roller; massage by family members helps for an hour or two   Multiple Pain Sites Yes   Pain Location Leg   Pain Orientation Right;Left  from hips down   Pain Descriptors / Indicators Sharp  hips like needles, knees ache   Aggravating Factors  walking and stepping bothers ankles   Pain Relieving Factors pain medicine helps sometimes, rest            [facility] PT  Assessment - 01/16/17 0001      Assessment   Medical Diagnosis lymphoma   Referring Provider Dr. Sullivan Lone   Onset Date/Surgical Date 09/21/16   Hand Dominance Right   Prior Therapy none recently     Precautions   Precautions Other (comment)   Precaution Comments cancer precautions; active chemo     Restrictions   Weight Bearing Restrictions No     Balance Screen   Has the patient fallen in the past 6 months No   Has the patient had a decrease in activity level because of a fear of falling?  No   Is the patient reluctant to leave their home because of a fear of falling?  No     Home Social worker Private residence   Living Arrangements Parent   Type of Home Other(Comment)  [place] level  but mostly stays downstairs     Prior Function   Level of Independence  Independent   Vocation Student   Leisure walking on days she feels up to it     Cognition   Overall Cognitive Status Within Functional Limits for tasks assessed     Posture/Postural Control   Posture/Postural Control Postural limitations   Postural Limitations Rounded Shoulders;Forward head     ROM / Strength   AROM / PROM / Strength Strength     AROM   Overall AROM Comments Both UEs and LEs as well as trunk grossly WFL  but pt. reports feeling stiff and moves slowly at times     Strength   Overall Strength Comments not tested due to h/o bony metastases  though patient reports she is 90% better     Palpation   Palpation comment tight muscles at rhomboid, middle trap area between scapulae superiorly     Ambulation/Gait   Ambulation/Gait Yes   Ambulation/Gait Assistance 7: Independent  but sometimes need a cane, 2-3 days after chemo     6 Minute Walk- Baseline   6 Minute Walk- Baseline yes   HR (bpm) 86   02 Sat (%RA) 98 %     6 Minute walk- Post Test   6 Minute Walk Post Test yes   HR (bpm) 88  but variable, dropped a bit and went a bit higher   02 Sat (%RA) 88 %  but up to 99 within 2 minutes   Modified Borg Scale for Dyspnea 2- Mild shortness of breath   Perceived Rate of Exertion (Borg) --  needed to slow down about 4 minutes into the 6     6 minute walk test results    Aerobic Endurance Distance Walked 1210            Objective measurements completed on examination: See above findings.                     Short Term Clinic Goals - 01/16/17 1454      CC Short Term Goal  #1   Title Pt. will report at least 50% decrease in pain in thoracic back at area between scapulae   Time 4   Period Weeks   Status New   Target Date 02/16/17     CC Short Term Goal  #2   Title Pt. will tolerate at least 15 minutes of nonstops mild to moderate exercise without significant shortness of breath or O2 saturation below 90%   Time 4   Period Weeks  Status New   Target Date 02/16/17             Long Term Clinic Goals - 01/16/17 1456      CC Long Term Goal  #1   Title Pt. will report at least 75% decrease in pain at thoracic back at area between scapulae.   Time 8   Period Weeks   Status New   Target Date 03/18/17     CC Long Term Goal  #2   Title Pt. will be able to walk nonstop for 25 minutes at mild to moderate pace without needing to stop due to shortness of breath or leg pain.   Time 8   Period Weeks   Status New   Target Date 03/18/17     CC Long Term Goal  #3   Title Pt. will be independent in a home exercise program for endurance and strengthening   Time 8   Period Weeks   Status New   Target Date 03/18/17     CC Long Term Goal  #4   Title Pt. will be knowledgeable about community programs she can transition to for continuing exercise.   Time 8   Period Weeks   Status New             Plan - 01/16/17 1446    Clinical Impression Statement This is a pleasant young woman with a diagnosis of Hodgkin's lymphoma.  She has been through several rounds of chemo, with good results per PET scan, and has several rounds to go.  She is a former athlete and now finds herself fatigued and becomes short of breath with relatively short walks.  She walked 1210 feet on 6 minute walk test, but about halfway through needed to slow her pace and did feel mildly short of breath.  Her O2 sats dropped to 88 but recovered very quickly to high 90s.  She has upper back pain.  She gets chemo every two weeks, and this gives her several days of feeling quite bad, but thinks she can work a therapy schedule around those bad days.     History and Personal Factors relevant to plan of care: none   Clinical Presentation Evolving   Clinical Presentation due to: in mid-chemotherapy treatment   Clinical Decision Making Low   Rehab Potential Good   Clinical Impairments Affecting Rehab Potential currently getting chemo every two weeks   PT  Frequency 2x / week   PT Duration 8 weeks   PT Treatment/Interventions ADLs/Self Care Home Management;Electrical Stimulation;Moist Heat;Therapeutic exercise;Patient/family education;Manual techniques;Passive range of motion;Taping   PT Next Visit Plan Two areas of focus:  (1) for upper back, consider stretching and strengthening to middle trap areas as well as soft tissue work to same; also posture re-ed; (2) work on endurance with bike, treadmill, elliptical, Nustep, etc. as well as strengthening exercise   Consulted and Agree with Plan of Care Patient      Patient will benefit from skilled therapeutic intervention in order to improve the following deficits and impairments:  Decreased endurance, Decreased activity tolerance, Pain, Postural dysfunction  Visit Diagnosis: Pain in thoracic spine - Plan: PT plan of care cert/re-cert  Other symptoms and signs involving the musculoskeletal system - Plan: PT plan of care cert/re-cert  Difficulty in walking, not elsewhere classified - Plan: PT plan of care cert/re-cert     Problem List Patient Active Problem List   Diagnosis Date Noted  . Iron deficiency anemia due to chronic  blood loss 10/12/2016  . Hodgkin's lymphoma ([place]) 10/10/2016    Alvis Edgell 01/16/2017, 3:01 PM  [place], [place], [address] Phone: [phone]   Fax:  [phone]  Name: Larri Brewton MRN: [id number] Date of Birth: 04/17/1991  Serafina Royals, PT 01/16/17 3:01 PM

## 2017-01-18 ENCOUNTER — Telehealth: Payer: Self-pay

## 2017-01-18 ENCOUNTER — Encounter: Payer: Self-pay | Admitting: Hematology

## 2017-01-18 ENCOUNTER — Other Ambulatory Visit: Payer: Self-pay

## 2017-01-18 ENCOUNTER — Ambulatory Visit (HOSPITAL_BASED_OUTPATIENT_CLINIC_OR_DEPARTMENT_OTHER): Payer: Self-pay

## 2017-01-18 ENCOUNTER — Other Ambulatory Visit (HOSPITAL_BASED_OUTPATIENT_CLINIC_OR_DEPARTMENT_OTHER): Payer: Self-pay

## 2017-01-18 ENCOUNTER — Ambulatory Visit (HOSPITAL_BASED_OUTPATIENT_CLINIC_OR_DEPARTMENT_OTHER): Payer: Self-pay | Admitting: Hematology

## 2017-01-18 VITALS — BP 139/91 | HR 96 | Temp 98.5°F | Resp 18 | Ht 61.0 in | Wt 169.3 lb

## 2017-01-18 DIAGNOSIS — Z5112 Encounter for antineoplastic immunotherapy: Secondary | ICD-10-CM

## 2017-01-18 DIAGNOSIS — C8198 Hodgkin lymphoma, unspecified, lymph nodes of multiple sites: Secondary | ICD-10-CM

## 2017-01-18 DIAGNOSIS — C8170 Other classical Hodgkin lymphoma, unspecified site: Secondary | ICD-10-CM

## 2017-01-18 DIAGNOSIS — Z5111 Encounter for antineoplastic chemotherapy: Secondary | ICD-10-CM

## 2017-01-18 DIAGNOSIS — G620 Drug-induced polyneuropathy: Secondary | ICD-10-CM

## 2017-01-18 DIAGNOSIS — C8194 Hodgkin lymphoma, unspecified, lymph nodes of axilla and upper limb: Secondary | ICD-10-CM

## 2017-01-18 DIAGNOSIS — T451X5A Adverse effect of antineoplastic and immunosuppressive drugs, initial encounter: Secondary | ICD-10-CM

## 2017-01-18 LAB — CBC WITH DIFFERENTIAL/PLATELET
BASO%: 1 % (ref 0.0–2.0)
Basophils Absolute: 0.1 10*3/uL (ref 0.0–0.1)
EOS ABS: 0.3 10*3/uL (ref 0.0–0.5)
EOS%: 4.1 % (ref 0.0–7.0)
HCT: 36.5 % (ref 34.8–46.6)
HEMOGLOBIN: 12.3 g/dL (ref 11.6–15.9)
LYMPH%: 14.4 % (ref 14.0–49.7)
MCH: 30.6 pg (ref 25.1–34.0)
MCHC: 33.6 g/dL (ref 31.5–36.0)
MCV: 91 fL (ref 79.5–101.0)
MONO#: 0.8 10*3/uL (ref 0.1–0.9)
MONO%: 10.5 % (ref 0.0–14.0)
NEUT%: 70 % (ref 38.4–76.8)
NEUTROS ABS: 5.5 10*3/uL (ref 1.5–6.5)
PLATELETS: 268 10*3/uL (ref 145–400)
RBC: 4.01 10*6/uL (ref 3.70–5.45)
RDW: 19.3 % — AB (ref 11.2–14.5)
WBC: 7.9 10*3/uL (ref 3.9–10.3)
lymph#: 1.1 10*3/uL (ref 0.9–3.3)

## 2017-01-18 LAB — COMPREHENSIVE METABOLIC PANEL
ALBUMIN: 3.4 g/dL — AB (ref 3.5–5.0)
ALT: 12 U/L (ref 0–55)
ANION GAP: 8 meq/L (ref 3–11)
AST: 17 U/L (ref 5–34)
Alkaline Phosphatase: 74 U/L (ref 40–150)
BUN: 5.6 mg/dL — ABNORMAL LOW (ref 7.0–26.0)
CO2: 27 mEq/L (ref 22–29)
Calcium: 9.7 mg/dL (ref 8.4–10.4)
Chloride: 106 mEq/L (ref 98–109)
Creatinine: 0.7 mg/dL (ref 0.6–1.1)
GLUCOSE: 76 mg/dL (ref 70–140)
Potassium: 3.8 mEq/L (ref 3.5–5.1)
SODIUM: 141 meq/L (ref 136–145)
TOTAL PROTEIN: 6.7 g/dL (ref 6.4–8.3)

## 2017-01-18 LAB — FERRITIN: Ferritin: 235 ng/ml (ref 9–269)

## 2017-01-18 MED ORDER — VINBLASTINE SULFATE CHEMO INJECTION 1 MG/ML
5.6000 mg/m2 | Freq: Once | INTRAVENOUS | Status: AC
  Administered 2017-01-18: 10 mg via INTRAVENOUS
  Filled 2017-01-18: qty 10

## 2017-01-18 MED ORDER — SODIUM CHLORIDE 0.9 % IV SOLN
0.9000 mg/kg | Freq: Once | INTRAVENOUS | Status: AC
  Administered 2017-01-18: 65 mg via INTRAVENOUS
  Filled 2017-01-18: qty 13

## 2017-01-18 MED ORDER — SODIUM CHLORIDE 0.9 % IV SOLN
Freq: Once | INTRAVENOUS | Status: AC
  Administered 2017-01-18: 13:00:00 via INTRAVENOUS
  Filled 2017-01-18: qty 5

## 2017-01-18 MED ORDER — DOXORUBICIN HCL CHEMO IV INJECTION 2 MG/ML
25.0000 mg/m2 | Freq: Once | INTRAVENOUS | Status: AC
  Administered 2017-01-18: 44 mg via INTRAVENOUS
  Filled 2017-01-18: qty 22

## 2017-01-18 MED ORDER — ALBUTEROL SULFATE (2.5 MG/3ML) 0.083% IN NEBU
2.5000 mg | INHALATION_SOLUTION | Freq: Once | RESPIRATORY_TRACT | Status: DC
  Filled 2017-01-18: qty 3

## 2017-01-18 MED ORDER — PALONOSETRON HCL INJECTION 0.25 MG/5ML
INTRAVENOUS | Status: AC
  Filled 2017-01-18: qty 5

## 2017-01-18 MED ORDER — PEGFILGRASTIM 6 MG/0.6ML ~~LOC~~ PSKT
6.0000 mg | PREFILLED_SYRINGE | Freq: Once | SUBCUTANEOUS | Status: AC
  Administered 2017-01-18: 6 mg via SUBCUTANEOUS
  Filled 2017-01-18: qty 0.6

## 2017-01-18 MED ORDER — SENNOSIDES-DOCUSATE SODIUM 8.6-50 MG PO TABS: 2.0000 | ORAL_TABLET | Freq: Every day | ORAL | 1 refills | Status: AC

## 2017-01-18 MED ORDER — SODIUM CHLORIDE 0.9% FLUSH
10.0000 mL | INTRAVENOUS | Status: DC | PRN
  Administered 2017-01-18: 10 mL
  Filled 2017-01-18: qty 10

## 2017-01-18 MED ORDER — SODIUM CHLORIDE 0.9 % IV SOLN
375.0000 mg/m2 | Freq: Once | INTRAVENOUS | Status: AC
  Administered 2017-01-18: 660 mg via INTRAVENOUS
  Filled 2017-01-18: qty 33

## 2017-01-18 MED ORDER — SODIUM CHLORIDE 0.9 % IV SOLN
Freq: Once | INTRAVENOUS | Status: AC
  Administered 2017-01-18: 13:00:00 via INTRAVENOUS

## 2017-01-18 MED ORDER — HEPARIN SOD (PORK) LOCK FLUSH 100 UNIT/ML IV SOLN
500.0000 [IU] | Freq: Once | INTRAVENOUS | Status: AC | PRN
  Administered 2017-01-18: 500 [IU]
  Filled 2017-01-18: qty 5

## 2017-01-18 MED ORDER — PALONOSETRON HCL INJECTION 0.25 MG/5ML
0.2500 mg | Freq: Once | INTRAVENOUS | Status: AC
  Administered 2017-01-18: 0.25 mg via INTRAVENOUS

## 2017-01-18 NOTE — Telephone Encounter (Signed)
appts made and avs printed for patient 

## 2017-01-18 NOTE — Patient Instructions (Signed)
Thank you for choosing [facility] to provide your oncology and hematology care.  To afford each patient quality time with our providers, please arrive 30 minutes before your scheduled appointment time.  If you arrive late for your appointment, you may be asked to reschedule.  We strive to give you quality time with our providers, and arriving late affects you and other patients whose appointments are after yours.  If you are a no show for multiple scheduled visits, you may be dismissed from the clinic at the providers discretion.   Again, thank you for choosing [facility], our hope is that these requests will decrease the amount of time that you wait before being seen by our physicians.  ______________________________________________________________________ Should you have questions after your visit to the [facility], please contact our office at [phone] between the hours of 8:30 and 4:30 p.m.    Voicemails left after 4:30p.m will not be returned until the following business day.   For prescription refill requests, please have your pharmacy contact us directly.  Please also try to allow 48 hours for prescription requests.   Please contact the scheduling department for questions regarding scheduling.  For scheduling of procedures such as PET scans, CT scans, MRI, Ultrasound, etc please contact central scheduling at [phone].   Resources For Cancer Patients and Caregivers:  American Cancer Society:  800-227-2345  Can help patients locate various types of support and financial assistance Cancer Care: 1-800-813-HOPE (4673) Provides financial assistance, online support groups, medication/co-pay assistance.   [place] DSS:  336-641-3447 Where to apply for food stamps, Medicaid, and utility assistance Medicare Rights Center: 800-333-4114 Helps people with Medicare understand their rights and benefits, navigate the Medicare system, and secure the  quality healthcare they deserve SCAT: 336-333-6589 [place] [employer]'s shared-ride transportation service for eligible riders who have a disability that prevents them from riding the fixed route bus.   For additional information on assistance programs please contact our social worker:   Grier Hock/Abigail Elmore:  [phone] 

## 2017-01-18 NOTE — Patient Instructions (Signed)
[facility] Discharge Instructions for Patients Receiving Chemotherapy  Today you received the following chemotherapy agents Adriamycin, DTIC, Adcetris and Velban  To help prevent nausea and vomiting after your treatment, we encourage you to take your nausea medication As directed If you develop nausea and vomiting that is not controlled by your nausea medication, call the clinic.   BELOW ARE SYMPTOMS THAT SHOULD BE REPORTED IMMEDIATELY:  *FEVER GREATER THAN 100.5 F  *CHILLS WITH OR WITHOUT FEVER  NAUSEA AND VOMITING THAT IS NOT CONTROLLED WITH YOUR NAUSEA MEDICATION  *UNUSUAL SHORTNESS OF BREATH  *UNUSUAL BRUISING OR BLEEDING  TENDERNESS IN MOUTH AND THROAT WITH OR WITHOUT PRESENCE OF ULCERS  *URINARY PROBLEMS  *BOWEL PROBLEMS  UNUSUAL RASH Items with * indicate a potential emergency and should be followed up as soon as possible.  Feel free to call the clinic you have any questions or concerns. The clinic phone number is [phone].  Please show the [facility] at check-in to the Emergency Department and triage nurse.

## 2017-01-19 LAB — SEDIMENTATION RATE: Sedimentation Rate-Westergren: 10 mm/hr (ref 0–32)

## 2017-01-19 NOTE — Progress Notes (Signed)
[name]    HEMATOLOGY/ONCOLOGY CLINIC NOTE  Date of Service: .01/18/2017  Patient Care Team: Brunetta Genera, MD as PCP - General (Hematology) Patient, No Pcp Per (General Practice)  CHIEF COMPLAINTS/PURPOSE OF CONSULTATION:  F/u classical Hodgkin's lymphoma  HISTORY OF PRESENTING ILLNESS:   Leah Floyd is a wonderful 26 y.o. female who has been referred to [place] by [employer] [facility] for evaluation and management of newly diagnosed classical Hodgkin's lymphoma.  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  She notes that in November 2017 she developed generalized body aches and flulike symptoms with some subjective fevers chills and night sweats. A primary care physician noted a right axillary lump and she was treated empirically with Z-Pak for possible infection. She notes that around Christmas time in 2017 she developed worsening fatigue and generalized body aches a point that she was having difficulties getting through the day. She notes that she had an autoimmune workup HIV testing which was unrevealing she was noted to have some iron deficiency. She notes that she had a follow-up ultrasound that showed right axillary lymph node was enlarging and she also had a left axillary lymph node and had noted inguinal lymph nodes in the groin. Some of her workup was delayed due to insurance issues that she eventually had a left axillary lymph node biopsy on 09/24/2016 accession number [id number] which showed classical Hodgkin's lymphoma.  Patient then has moved back from [place] to be with her mother and family in [place] not, and is here to seek further cares for her classical Hodgkin's lymphoma newly diagnosed.  Patient notes significant fatigue, night sweats, weight loss of about 40 pounds in  the last 4-5 months, generalized body aches, dry cough and loss of appetite.  She notes that she previously had heavy menstrual periods lasting 7 days which are now become lighter and last about 4 days. She has been taking ferrous sulfate plus vitamin C for her iron deficiency anemia.  No previous heart problems. Has been having some mild diarrhea which she describes as soft stools 2-3 times a day.  INTERVAL HISTORY  Leah Floyd is here with her mother  for a followup prior to C4 D15 of her chemotherapy. She is much better spirits and much more comfortable physically and emotionally today. The slightly decreased in Brentuximab . Cymbalta and outpatient cancer rehab also apears to have helped significantly.  Myalgia and fatigue improved and more manageable. No fevers or chills no night sweats.   MEDICAL HISTORY:   #1 iron deficiency anemia #2 GERD #3 obesity. #4 pseudotumor cerebri however this has resolved with her weight loss as per patient. #5 asthma #6 of newly diagnosed Hodgkin's lymphoma #7 shellfish allergy #8 history of Chlamydia infection   SURGICAL HISTORY: Past Surgical History:  Procedure Laterality Date  . ANTERIOR CRUCIATE LIGAMENT REPAIR     left  . HERNIA REPAIR     left  . IR FLUORO GUIDE PORT INSERTION RIGHT  10/04/2016  . IR US GUIDE VASC ACCESS RIGHT  10/04/2016  . TONSILLECTOMY      SOCIAL HISTORY: Social History   Social History  . Marital status: Single    Spouse name: N/A  . Number of children: N/A  . Years of education: N/A   Occupational History  . Not on file.   Social History Main Topics  .  Smoking status: Never Smoker  . Smokeless tobacco: Never Used  . Alcohol use Yes     Comment: socially  . Drug use: Yes    Types: Marijuana     Comment: medical   . Sexual activity: Not on file   Other Topics Concern  . Not on file   Social History Narrative  . No narrative on file    FAMILY HISTORY: Notes her first cousin on her mom's side had  some form off rare bone marrow cancer Does not know history on her father's side of the family Maternal grandmother-hypertension, diabetes, fibromyalgia Mother anemia Other relatives on her mom's side with lymphoma and bladder cancer.  ALLERGIES:  is allergic to shellfish-derived products.  MEDICATIONS:  Current Outpatient Prescriptions  Medication Sig Dispense Refill  . albuterol (PROVENTIL HFA;VENTOLIN HFA) 108 (90 Base) MCG/ACT inhaler Inhale 2 puffs into the lungs every 6 (six) hours as needed for wheezing or shortness of breath. 1 Inhaler 2  . Ascorbic Acid (VITAMIN C PO) Take by mouth.    . Cyanocobalamin (VITAMIN B-12 CR PO) Take 1 tablet by mouth daily.    [name] dexamethasone (DECADRON) 4 MG tablet Take 2 tablets by mouth once a day on the day after chemotherapy and then take 2 tablets two times a day for 2 days. Take with food. 30 tablet 1  . diphenhydramine-acetaminophen (TYLENOL PM) 25-500 MG TABS tablet Take 2 tablets by mouth at bedtime as needed.    . DULoxetine (CYMBALTA) 30 MG capsule Take 1 capsule (30 mg total) by mouth daily. 30 capsule 1  . EPINEPHrine 0.3 mg/0.3 mL IJ SOAJ injection Inject 0.3 mLs (0.3 mg total) into the muscle once. For anaphylactic reactions 1 Device 0  . HYDROcodone-acetaminophen (NORCO/VICODIN) 5-325 MG tablet Take 1-2 tablets by mouth every 6 (six) hours as needed for moderate pain or severe pain. 30 tablet 0  . lidocaine-prilocaine (EMLA) cream Apply to affected area once 30 g 3  . loratadine (CLARITIN) 10 MG tablet Take 10 mg by mouth daily.    [name] LORazepam (ATIVAN) 0.5 MG tablet Take 1 tablet (0.5 mg total) by mouth every 6 (six) hours as needed (Nausea or vomiting). 30 tablet 0  . LORazepam (ATIVAN) 0.5 MG tablet Take 1 tablet (0.5 mg total) by mouth every 6 (six) hours as needed (Nausea or vomiting). 30 tablet 0  . magic mouthwash w/lidocaine SOLN Take 5 mLs by mouth 4 (four) times daily as needed for mouth pain. 120 mL 0  . ondansetron (ZOFRAN) 8 MG  tablet Take 1 tablet (8 mg total) by mouth 2 (two) times daily as needed. Start on the third day after chemotherapy. 30 tablet 1  . pantoprazole (PROTONIX) 40 MG tablet Take 1 tablet (40 mg total) by mouth daily. 30 tablet 1  . PEGFILGRASTIM [place] Apply 1 patch topically once.    . prochlorperazine (COMPAZINE) 10 MG tablet Take 1 tablet (10 mg total) by mouth every 6 (six) hours as needed (Nausea or vomiting). 30 tablet 1  . senna-docusate (SENNA S) 8.6-50 MG tablet Take 2 tablets by mouth at bedtime. Hold if diarrhea ensues 60 tablet 1  . sucralfate (CARAFATE) 1 GM/10ML suspension Take 10 mLs (1 g total) by mouth 4 (four) times daily -  with meals and at bedtime. 420 mL 0   No current facility-administered medications for this visit.     REVIEW OF SYSTEMS:    10 Point review of Systems was done is negative except as noted above.  PHYSICAL EXAMINATION: ECOG PERFORMANCE STATUS: 1 - Symptomatic but completely ambulatory  . Vitals:   01/18/17 1102  BP: (!) 139/91  Pulse: 96  Resp: 18  Temp: 98.5 F (36.9 C)  SpO2: 100%   Filed Weights   01/18/17 1102  Weight: 169 lb 4.8 oz (76.8 kg)   .Body mass index is 31.99 kg/m.  GENERAL:alert, in no acute distress and comfortable SKIN: no acute rashes, no significant lesions EYES: conjunctiva are pink and non-injected, sclera anicteric OROPHARYNX: MMM, no exudates, no oropharyngeal erythema or ulceration NECK: supple, no JVD LYMPH:  Patient has palpable lymphadenopathy bilaterally in her neck , occipital area, supraclavicular, bilateral axillary and inguinal areas  LUNGS: clear to auscultation b/l with normal respiratory effort HEART: regular rate & rhythm ABDOMEN:  normoactive bowel sounds , non tender, not distended. Borderline palpable splenomegaly, no palpable hepatomegaly. Extremity: no pedal edema PSYCH: alert & oriented x 3 with fluent speech NEURO: no focal motor/sensory deficits  LABORATORY DATA:  I have reviewed the data as  listed  . CBC Latest Ref Rng & Units 01/18/2017 01/04/2017 12/31/2016  WBC 3.9 - 10.3 10e3/uL 7.9 10.1 9.1  Hemoglobin 11.6 - 15.9 g/dL 12.3 13.1 12.0  Hematocrit 34.8 - 46.6 % 36.5 38.4 34.1(L)  Platelets 145 - 400 10e3/uL 268 319 403(H)   ANC 4.7k . CBC    Component Value Date/Time   WBC 7.9 01/18/2017 1033   WBC 9.1 12/31/2016 0300   RBC 4.01 01/18/2017 1033   RBC 3.97 12/31/2016 0300   HGB 12.3 01/18/2017 1033   HCT 36.5 01/18/2017 1033   PLT 268 01/18/2017 1033   MCV 91.0 01/18/2017 1033   MCH 30.6 01/18/2017 1033   MCH 30.2 12/31/2016 0300   MCHC 33.6 01/18/2017 1033   MCHC 35.2 12/31/2016 0300   RDW 19.3 (H) 01/18/2017 1033   LYMPHSABS 1.1 01/18/2017 1033   MONOABS 0.8 01/18/2017 1033   EOSABS 0.3 01/18/2017 1033   BASOSABS 0.1 01/18/2017 1033    . CMP Latest Ref Rng & Units 01/18/2017 01/04/2017 12/31/2016  Glucose 70 - 140 mg/dl 76 95 133(H)  BUN 7.0 - 26.0 mg/dL 5.6(L) 3.1(L) <5(L)  Creatinine 0.6 - 1.1 mg/dL 0.7 0.8 0.52  Sodium 136 - 145 mEq/L 141 137 134(L)  Potassium 3.5 - 5.1 mEq/L 3.8 3.8 3.3(L)  Chloride 101 - 111 mmol/L - - 99(L)  CO2 22 - 29 mEq/L 27 28 25   Calcium 8.4 - 10.4 mg/dL 9.7 10.0 9.6  Total Protein 6.4 - 8.3 g/dL 6.7 7.2 7.5  Total Bilirubin 0.20 - 1.20 mg/dL <0.22 0.25 0.5  Alkaline Phos 40 - 150 U/L 74 74 71  AST 5 - 34 U/L 17 23 23   ALT 0 - 55 U/L 12 18 21    .  RADIOGRAPHIC STUDIES: I have personally reviewed the radiological images as listed and agreed with the findings in the report. Nm Pet Image Restag (ps) Skull Base To Thigh  Result Date: 01/02/2017 CLINICAL DATA:  Subsequent treatment strategy for Hodgkin's lymphoma. EXAM: NUCLEAR MEDICINE PET SKULL BASE TO THIGH TECHNIQUE: 8.2 mCi F-18 FDG was injected intravenously. Full-ring PET imaging was performed from the skull base to thigh after the radiotracer. CT data was obtained and used for attenuation correction and anatomic localization. FASTING BLOOD GLUCOSE:  Value: 103 mg/dl  COMPARISON:  PET-CT 10/10/2016 FINDINGS: NECK Reduction in metabolic activity of lymph node within a LEFT parotid gland tissue with no residual activity above mediastinal activity. CHEST Marked reduction in size and metabolic  activity of bilateral axillary lymph nodes. The remaining lymph nodes have metabolic activity of similar to mediastinal activity (SUV max equal 2.6) decreased from 8.0. Likewise the prevascular adenopathy has resolved with metabolic activity with activity equal to mediastinal activity. No suspicious pulmonary nodules. There is mild ground-glass opacities in the upper lobes with mild metabolic activity with SUV max equal 3.6. ABDOMEN/PELVIS Bulky retroperitoneal periaortic adenopathy is much reduced in size and metabolic activity. The periaortic lymph nodes are 10 mm or less with metabolic activity similar to or less than mediastinal activity. Individual lymph nodes are difficult to define. LEFT periaortic node measuring 1.7 SUV max decreased from 9.0. In the pelvis likewise reduction in bulky iliac lymphadenopathy. The LEFT external iliac lymph nodes are mildly enlarged 1.2 cm with SUV max equal 2.7 decreased from 11.5. Moderate activity associated with inguinal lymph nodes. Most intense in the LEFT superficial inguinal node with SUV max equal 5.0 which is moderately above liver activity (SUV max 3.7). This activity is new from prior. This lymph node is normal size. (Image 25 of fused data set). Potential reactive adenopathy Normal spleen. SKELETON There is diffuse increase in marrow activity favored to represent GCSF type response. IMPRESSION: 1. Marked reduction in size and metabolic activity of axillary adenopathy, mediastinal adenopathy, and periaortic retroperitoneal adenopathy. Activity of these nodes is similar to mediastinal activity and less than liver activity ( Deauville 2). 2. Decreased in size and metabolic activity of bilateral iliac lymphadenopathy with enlarged lymph nodes  remaining ( Deauville 2). 3. Most intense activity remains within LEFT inguinal lymph node with activity above liver activity Deauville 4. However, this lymph node was not metabolic on comparison examination and is normal size. Potential reactive node. 4. Increase in marrow activity is favored GCSF type response. 5. Mild metabolic activity associated ground-glass opacities in the upper lobes is favored infectious or inflammatory. Correlate with drug reaction / toxicity. These results will be called to the ordering clinician or representative by the Radiologist Assistant, and communication documented in the PACS or zVision Dashboard. Electronically Signed   By: Suzy Bouchard M.D.   On: 01/02/2017 16:58   Dg Chest Portable 1 View  Result Date: 12/31/2016 CLINICAL DATA:  Shortness of breath and fatigue for 2 days. Recent chemotherapy for new diagnosis Hodgkin's lymphoma. EXAM: PORTABLE CHEST 1 VIEW COMPARISON:  PET-CT 10/10/2016 FINDINGS: Power port type central venous catheter with tip over the low SVC region. No pneumothorax. Normal heart size and pulmonary vascularity. No focal airspace disease or consolidation in the lungs. No blunting of costophrenic angles. No pneumothorax. Mediastinal contours appear intact. IMPRESSION: No evidence of active pulmonary disease. Electronically Signed   By: Lucienne Capers M.D.   On: 12/31/2016 02:31    ASSESSMENT & PLAN:   26 year old female with   #1 Classical Hodgkin's lymphoma Stage IVB as per PET/CT Patient had constitutional symptoms with significant weight loss of 40 pounds, and night sweats and debilitating fatigue which has significantly improved.  PET/CT after 3 cycles of treatment with excellent response to treatment thus far.  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  PLAN -labs are stable  -continue A-AVD with  current  dose reduction of Brentuximab to 0.9mg /kg -vicodin prn for pain -continue on Cymbalta 30mg  po daily -continue working with rehab for strength and endurance and to help with fatigue. -discussed importance of maintaining  good po food intake.   #4 Grade 1 oral mucositis and oral thrush . now resolved status post use of nystatin . Plan  - oral cryotherapy while getting chemotherapy - patient was counseled on this . - given refills for Magic mouthwash when necessary   #3 history of iron deficiency anemia Has been on oral iron that causes significant constipation and is still significantly anemic. S/p IV Injectafer 750mg  x 1 dose  hgb has improved. Ferritin adequate.  Lab Results  Component Value Date   FERRITIN 235 01/18/2017     plan  - No indication for additional iron replacement at this time  #4 history of asthma. This has been mild but tends to get worse in [place]. 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 Patient notes that she tolerates eating shrimp. -Prescribed EpiPen for when necessary use. -Recommended also to carry Benadryl.  #5 general medical cares -Patient was recommended to establish a primary care physician as soon as possible.  continue treatment and labs as per schedule q2weeks. -plz schedule all remain treatment through Cycle 6 -RTC with Dr Irene Limbo in 4 weeks with C5D15   I spent 20 minutes counseling the patient face to face. The total time spent in the appointment was 25 minutes and more than 50% was on counseling and direct patient cares.    Sullivan Lone MD [place] AAHIVMS [facility] Hematology/Oncology Physician [facility]  (Office):       [phone] (Work cell):  [phone] (Fax):           [phone]

## 2017-01-22 ENCOUNTER — Ambulatory Visit: Payer: Self-pay | Admitting: Physical Therapy

## 2017-01-22 DIAGNOSIS — M546 Pain in thoracic spine: Secondary | ICD-10-CM

## 2017-01-22 DIAGNOSIS — R29898 Other symptoms and signs involving the musculoskeletal system: Secondary | ICD-10-CM

## 2017-01-22 NOTE — Therapy (Signed)
[place], [place], [address] Phone: [phone]   Fax:  [phone]  Physical Therapy Treatment  Patient Details  Name: Leah Floyd MRN: [id number] Date of Birth: [birth year] Referring Provider: Dr. Sullivan Lone  Encounter Date: 01/22/2017      PT End of Session - 01/22/17 1441    Visit Number 2   Number of Visits 17   Date for PT Re-Evaluation 03/18/17   PT Start Time 6333   PT Stop Time 1432   PT Time Calculation (min) 47 min   Activity Tolerance Patient tolerated treatment well   Behavior During Therapy [facility] for tasks assessed/performed      Past Medical History:  Diagnosis Date  . Cancer [facility])    lymphoma     Past Surgical History:  Procedure Laterality Date  . ANTERIOR CRUCIATE LIGAMENT REPAIR     left  . HERNIA REPAIR     left  . IR FLUORO GUIDE PORT INSERTION RIGHT  10/04/2016  . IR US GUIDE VASC ACCESS RIGHT  10/04/2016  . TONSILLECTOMY      There were no vitals filed for this visit.      Subjective Assessment - 01/22/17 1357    Subjective Pt states her plan is to go back to [place] sometime after the first of the  year.  She has only been able to walk about 10 minuts at time    Pertinent History Diagnosed with Hodgkin's lymphoma 09/21/16. Was living in [place] before that and had gotten sick in November, but was thought to have an infection or flu; large lymph node went away but a month later came back.  Was thought to have autoimmune disease.  Then insurance ran out.  Had a number of lumps at axillae and groin and was told she might have cancer. Biopsy showed cancer and she moved back here.  Started chemo soon after that and is still on chemo.  Was diagnosed at stage IV. Was in school in [place] and then online, bt that became too stressful.  Has chemo every two weeks; feels bad right away after chemo for a week+, then has 3-4 good days before the next chemo. Is at round8 of  12 now. Had PET scan two weeks ago that showed 90% improvement so is still on chemo. Will have her next chemo 01/18/17.  Body has been achy this time.  No other health issues.  Had torn left ACL in 2010 and had surgery for that; recovered enough to go back to basketball.                         [facility] Adult PT Treatment/Exercise - 01/22/17 0001      Lumbar Exercises: Aerobic   Stationary Bike 10 minutes. HR 135, O2 sat 98, RPE 7-8      Shoulder Exercises: Supine   Other Supine Exercises supine scapular series with yellow theraband x 3 repetitions each    Other Supine Exercises Meeks decompression exercise 3 repetitions each      Manual Therapy   Manual Therapy Soft tissue mobilization   Soft tissue mobilization soft tissue work with prolonged pressure to upper trap near levator scap insertion. pt report some relieft, but trigger points remain palpable.                 PT Education - 01/22/17 1440    Education provided Yes   Education Details meeks decompression exercise, supine  scapulare series with yellow theraband, "yoga by adrienne" on internet.  Pt aware of taichi classes at [facility]) Educated Patient   Methods Explanation;Demonstration;Handout   Comprehension Verbalized understanding;Returned demonstration           Short Term Clinic Goals - 01/16/17 1454      CC Short Term Goal  #1   Title Pt. will report at least 50% decrease in pain in thoracic back at area between scapulae   Time 4   Period Weeks   Status New   Target Date 02/16/17     CC Short Term Goal  #2   Title Pt. will tolerate at least 15 minutes of nonstops mild to moderate exercise without significant shortness of breath or O2 saturation below 90%   Time 4   Period Weeks   Status New   Target Date 02/16/17             Long Term Clinic Goals - 01/16/17 1456      CC Long Term Goal  #1   Title Pt. will report at least 75% decrease in pain at thoracic back  at area between scapulae.   Time 8   Period Weeks   Status New   Target Date 03/18/17     CC Long Term Goal  #2   Title Pt. will be able to walk nonstop for 25 minutes at mild to moderate pace without needing to stop due to shortness of breath or leg pain.   Time 8   Period Weeks   Status New   Target Date 03/18/17     CC Long Term Goal  #3   Title Pt. will be independent in a home exercise program for endurance and strengthening   Time 8   Period Weeks   Status New   Target Date 03/18/17     CC Long Term Goal  #4   Title Pt. will be knowledgeable about community programs she can transition to for continuing exercise.   Time 8   Period Weeks   Status New            Plan - 01/22/17 1443    Clinical Impression Statement Pt did well on the stationary bike but did have a HR of 135. She also did well with postural and beginning scapular exercise.  She continues to have tighness and trigger points in  upper back and will benefit from focus of treatment on this area next visit   Rehab Potential Good   Clinical Impairments Affecting Rehab Potential currently getting chemo every two weeks   PT Next Visit Plan check to see if pt has any effects from exercise done today.  Short application of moist heat and soft tissue work to traps/levator area. Stationary bike x 10 minutes to work on endurance.    Consulted and Agree with Plan of Care Patient      Patient will benefit from skilled therapeutic intervention in order to improve the following deficits and impairments:  Decreased endurance, Decreased activity tolerance, Pain, Postural dysfunction  Visit Diagnosis: Pain in thoracic spine  Other symptoms and signs involving the musculoskeletal system     Problem List Patient Active Problem List   Diagnosis Date Noted  . Iron deficiency anemia due to chronic blood loss 10/12/2016  . Hodgkin's lymphoma ([place]) 10/10/2016   Donato Heinz. Owens Shark, PT  Norwood Levo 01/22/2017,  2:58 PM  [place]  [place], [place], [address] Phone: [phone]   Fax:  [phone]  Name: Monay Houlton MRN: [id number] Date of Birth: 24-Jul-1990

## 2017-01-22 NOTE — Patient Instructions (Signed)
1. Decompression Exercise     Cancer Rehab [phone]    Lie on back on firm surface, knees bent, feet flat, arms turned up, out to sides, backs of hands down. Time _5-15__ minutes. Surface: floor   2. Shoulder Press    Start in Decompression Exercise position. Press shoulders downward towards supporting surface. Hold __2-3__ seconds while counting out loud. Repeat _3-5___ times. Do _1-2___ times per day.   3. Head Press    Bring cervical spine (neck) into neutral position (by either tucking the chin towards the chest or tilting the chin upward). Feel weight on back of head. Press head downward into supporting surface.    Hold _2-3__ seconds. Repeat _3-5__ times. Do _1-2__ times per day.   4. Leg Lengthener    Straighten one leg. Pull toes AND forefoot toward knee, extend heel. Lengthen leg by pulling pelvis away from ribs. Hold _2-3__ seconds. Relax. Repeat __4-6__ times. Do other leg.  Surface: floor   5. Leg Press    Straighten one leg down to floor keeping leg aligned with hip. Pull toes AND forefoot toward knee; extend heel.  Press entire leg downward (as if pressing leg into sandy beach). DO NOT BEND KNEE. Hold _2-3__ seconds. Do __4-6__ times. Repeat with other leg.                Over Head Pull: Narrow Grip     [place] [phone]   On back, knees bent, feet flat, band across thighs, elbows straight but relaxed. Pull hands apart (start). Keeping elbows straight, bring arms up and over head, hands toward floor. Keep pull steady on band. Hold momentarily. Return slowly, keeping pull steady, back to start. Repeat ___ times. Band color ______   Side Pull: Double Arm   On back, knees bent, feet flat. Arms perpendicular to body, shoulder level, elbows straight but relaxed. Pull arms out to sides, elbows straight. Resistance band comes across collarbones, hands toward floor. Hold momentarily. Slowly return to starting position. Repeat ___ times. Band color _____     Sash   On back, knees bent, feet flat, left hand on left hip, right hand above left. Pull right arm DIAGONALLY (hip to shoulder) across chest. Bring right arm along head toward floor. Hold momentarily. Slowly return to starting position. Repeat ___ times. Do with left arm. Band color ______   Shoulder Rotation: Double Arm   On back, knees bent, feet flat, elbows tucked at sides, bent 90, hands palms up. Pull hands apart and down toward floor, keeping elbows near sides. Hold momentarily. Slowly return to starting position. Repeat ___ times. Band color ______

## 2017-01-24 ENCOUNTER — Ambulatory Visit: Payer: Self-pay

## 2017-01-24 DIAGNOSIS — R262 Difficulty in walking, not elsewhere classified: Secondary | ICD-10-CM

## 2017-01-24 DIAGNOSIS — M546 Pain in thoracic spine: Secondary | ICD-10-CM

## 2017-01-24 DIAGNOSIS — R29898 Other symptoms and signs involving the musculoskeletal system: Secondary | ICD-10-CM

## 2017-01-24 NOTE — Therapy (Signed)
[place], [place], [address] Phone: [phone]   Fax:  [phone]  Physical Therapy Treatment  Patient Details  Name: Leah Floyd MRN: [id number] Date of Birth: Oct 25, 1990 Referring Provider: Dr. Sullivan Lone  Encounter Date: 01/24/2017      PT End of Session - 01/24/17 1104    Visit Number 3   Number of Visits 17   Date for PT Re-Evaluation 03/18/17   PT Start Time 1022   PT Stop Time 1104   PT Time Calculation (min) 42 min   Activity Tolerance Patient tolerated treatment well   Behavior During Therapy [facility] for tasks assessed/performed      Past Medical History:  Diagnosis Date  . Cancer [facility])    lymphoma     Past Surgical History:  Procedure Laterality Date  . ANTERIOR CRUCIATE LIGAMENT REPAIR     left  . HERNIA REPAIR     left  . IR FLUORO GUIDE PORT INSERTION RIGHT  10/04/2016  . IR US GUIDE VASC ACCESS RIGHT  10/04/2016  . TONSILLECTOMY      There were no vitals filed for this visit.      Subjective Assessment - 01/24/17 1024    Subjective Felt okay after last visit, wasn't overally fatigued. My neck/upper back felt really good after last visit, the stretches have been helping alot. I slept better last night too.    Pertinent History Diagnosed with Hodgkin's lymphoma 09/21/16. Was living in [place] before that and had gotten sick in November, but was thought to have an infection or flu; large lymph node went away but a month later came back.  Was thought to have autoimmune disease.  Then insurance ran out.  Had a number of lumps at axillae and groin and was told she might have cancer. Biopsy showed cancer and she moved back here.  Started chemo soon after that and is still on chemo.  Was diagnosed at stage IV. Was in school in [place] and then online, bt that became too stressful.  Has chemo every two weeks; feels bad right away after chemo for a week+, then has 3-4 good days  before the next chemo. Is at round8 of 12 now. Had PET scan two weeks ago that showed 90% improvement so is still on chemo. Will have her next chemo 01/18/17.  Body has been achy this time.  No other health issues.  Had torn left ACL in 2010 and had surgery for that; recovered enough to go back to basketball.   Patient Stated Goals Feel a little better, more energy, less pain.   Currently in Pain? No/denies                         [facility] Adult PT Treatment/Exercise - 01/24/17 0001      Lumbar Exercises: Aerobic   Stationary Bike 10 minutes. HR 127 and SpO2 99% initially and then HR 150, SpO2 98, RPE 5-6 (came down to 123 after 2 mins)     Shoulder Exercises: Supine   Other Supine Exercises Supine over towel roll at thoracic spine for bil scaption (in a "V") 10 times to help decrease pt c/o of "sternum popping) she's been feeling     Shoulder Exercises: Standing   Other Standing Exercises Bil 3 way raises with 2 lbs with head, shoulders and back agaisnt wall with core engaged 10 times each direction (flexion, scaption and abduction to shoulder height)  Manual Therapy   Manual Therapy Soft tissue mobilization   Soft tissue mobilization In Supine: soft tissue work with prolonged pressure to upper trap near levator scap insertion with cervical P/ROM to stretch tightness at upper traps. pt report some relieft, and trigger points very minimal today with releases noted during manual work.                   Short Term Clinic Goals - 01/16/17 1454      CC Short Term Goal  #1   Title Pt. will report at least 50% decrease in pain in thoracic back at area between scapulae   Time 4   Period Weeks   Status New   Target Date 02/16/17     CC Short Term Goal  #2   Title Pt. will tolerate at least 15 minutes of nonstops mild to moderate exercise without significant shortness of breath or O2 saturation below 90%   Time 4   Period Weeks   Status New   Target Date 02/16/17              Long Term Clinic Goals - 01/16/17 1456      CC Long Term Goal  #1   Title Pt. will report at least 75% decrease in pain at thoracic back at area between scapulae.   Time 8   Period Weeks   Status New   Target Date 03/18/17     CC Long Term Goal  #2   Title Pt. will be able to walk nonstop for 25 minutes at mild to moderate pace without needing to stop due to shortness of breath or leg pain.   Time 8   Period Weeks   Status New   Target Date 03/18/17     CC Long Term Goal  #3   Title Pt. will be independent in a home exercise program for endurance and strengthening   Time 8   Period Weeks   Status New   Target Date 03/18/17     CC Long Term Goal  #4   Title Pt. will be knowledgeable about community programs she can transition to for continuing exercise.   Time 8   Period Weeks   Status New            Plan - 01/24/17 1105    Clinical Impression Statement Pts HR did incresae to 150 after bike but upon sitting for <2 mins it decreased to 123 and pt reported her RPE less today from last time on bike. She reports therapy has been helpful in teaching her to manage her symptoms of fatigue and muscle tightness during her current treatments. Reproted feeling less tightness after session today and "always feels like a new woman leaving therapy".   Rehab Potential Good   Clinical Impairments Affecting Rehab Potential currently getting chemo every two weeks   PT Frequency 2x / week   PT Duration 8 weeks   PT Treatment/Interventions ADLs/Self Care Home Management;Electrical Stimulation;Moist Heat;Therapeutic exercise;Patient/family education;Manual techniques;Passive range of motion;Taping   PT Next Visit Plan check to see if pt has any effects from exercise done today.  Possibly short application of moist heat and soft tissue work to traps/levator area. Stationary bike x 10 minutes to work on endurance.    Consulted and Agree with Plan of Care Patient       Patient will benefit from skilled therapeutic intervention in order to improve the following deficits and impairments:  Decreased endurance, Decreased activity  tolerance, Pain, Postural dysfunction  Visit Diagnosis: Pain in thoracic spine  Other symptoms and signs involving the musculoskeletal system  Difficulty in walking, not elsewhere classified     Problem List Patient Active Problem List   Diagnosis Date Noted  . Iron deficiency anemia due to chronic blood loss 10/12/2016  . Hodgkin's lymphoma ([place]) 10/10/2016    Otelia Limes, PTA 01/24/2017, 11:09 AM  [place], [place], [address] Phone: [phone]   Fax:  [phone]  Name: Phiona Ramnauth MRN: [id number] Date of Birth: [birth year]

## 2017-01-29 ENCOUNTER — Ambulatory Visit: Payer: Self-pay

## 2017-01-29 DIAGNOSIS — R262 Difficulty in walking, not elsewhere classified: Secondary | ICD-10-CM

## 2017-01-29 DIAGNOSIS — M546 Pain in thoracic spine: Secondary | ICD-10-CM

## 2017-01-29 DIAGNOSIS — R29898 Other symptoms and signs involving the musculoskeletal system: Secondary | ICD-10-CM

## 2017-01-29 NOTE — Therapy (Signed)
[place], [place], [address] Phone: [phone]   Fax:  [phone]  Physical Therapy Treatment  Patient Details  Name: Leah Floyd MRN: [id number] Date of Birth: 08-Aug-1990 Referring Provider: Dr. Sullivan Lone  Encounter Date: 01/29/2017      PT End of Session - 01/29/17 1107    Visit Number 4   Number of Visits 17   Date for PT Re-Evaluation 03/18/17   PT Start Time 1022   PT Stop Time 1106   PT Time Calculation (min) 44 min   Activity Tolerance Patient tolerated treatment well   Behavior During Therapy [facility] for tasks assessed/performed      Past Medical History:  Diagnosis Date  . Cancer [facility])    lymphoma     Past Surgical History:  Procedure Laterality Date  . ANTERIOR CRUCIATE LIGAMENT REPAIR     left  . HERNIA REPAIR     left  . IR FLUORO GUIDE PORT INSERTION RIGHT  10/04/2016  . IR US GUIDE VASC ACCESS RIGHT  10/04/2016  . TONSILLECTOMY      There were no vitals filed for this visit.      Subjective Assessment - 01/29/17 1024    Subjective I was pretty sore in my lats after last visit through over the weekend but more sore than pain I think from the 3 way raises. I walked alot over the weekend and that felt good, I wasn't as exhausted from that (walked over a mile) as I would've been a few weeks ago. My endurance is definitely getting better.    Pertinent History Diagnosed with Hodgkin's lymphoma 09/21/16. Was living in [place] before that and had gotten sick in November, but was thought to have an infection or flu; large lymph node went away but a month later came back.  Was thought to have autoimmune disease.  Then insurance ran out.  Had a number of lumps at axillae and groin and was told she might have cancer. Biopsy showed cancer and she moved back here.  Started chemo soon after that and is still on chemo.  Was diagnosed at stage IV. Was in school in [place] and then  online, bt that became too stressful.  Has chemo every two weeks; feels bad right away after chemo for a week+, then has 3-4 good days before the next chemo. Is at round8 of 12 now. Had PET scan two weeks ago that showed 90% improvement so is still on chemo. Will have her next chemo 01/18/17.  Body has been achy this time.  No other health issues.  Had torn left ACL in 2010 and had surgery for that; recovered enough to go back to basketball.   Patient Stated Goals Feel a little better, more energy, less pain.   Currently in Pain? No/denies                         [facility] Adult PT Treatment/Exercise - 01/29/17 0001      Lumbar Exercises: Aerobic   Stationary Bike 10 minutes. After HR 140, SpO2 99%, RPE 5-6, reports still feeling moderate exertion but less than last time.   Elliptical On QuickStart setting for 5 minutes; HR 174 and SpO2 98% RPE 7-8 with pt reporting feeling good after that activity; HR 149 after 1 minute     Shoulder Exercises: Standing   Other Standing Exercises Bil 3 way raises with 1 lbs with head, shoulders and back  agaisnt wall with core engaged 10 times each direction (flexion, scaption and abduction to shoulder height)     Manual Therapy   Manual Therapy Soft tissue mobilization   Soft tissue mobilization In Supine: soft tissue work with prolonged pressure to upper trap near levator scap insertion with cervical P/ROM to stretch tightness at upper traps. pt report some relieft, and trigger points very minimal today with releases noted during manual work.                   Short Term Clinic Goals - 01/29/17 1042      CC Short Term Goal  #1   Title Pt. will report at least 50% decrease in pain in thoracic back at area between scapulae   Baseline 65% improvement reported at this time-01/29/17   Status Achieved     CC Short Term Goal  #2   Title Pt. will tolerate at least 15 minutes of nonstops mild to moderate exercise without significant  shortness of breath or O2 saturation below 90%   Baseline Pt tolerated 15 mins of cardio (bike 10' and elliptical 5') with SpO2 being no less than 98% - 01/29/17   Status Achieved             Long Term Clinic Goals - 01/16/17 1456      CC Long Term Goal  #1   Title Pt. will report at least 75% decrease in pain at thoracic back at area between scapulae.   Time 8   Period Weeks   Status New   Target Date 03/18/17     CC Long Term Goal  #2   Title Pt. will be able to walk nonstop for 25 minutes at mild to moderate pace without needing to stop due to shortness of breath or leg pain.   Time 8   Period Weeks   Status New   Target Date 03/18/17     CC Long Term Goal  #3   Title Pt. will be independent in a home exercise program for endurance and strengthening   Time 8   Period Weeks   Status New   Target Date 03/18/17     CC Long Term Goal  #4   Title Pt. will be knowledgeable about community programs she can transition to for continuing exercise.   Time 8   Period Weeks   Status New            Plan - 01/29/17 1108    Clinical Impression Statement Progressed cardio today to include 5 mins of elliptical after 10 mins of bike. Pt tolerated this very well with only reporting feeling moderate exertion and very minimal SOB after. She also reported had walked alot over the weekend and was not as exhausted as she usually gets feeling improvement overall with her endurance. Her upper back is improving, neck still is tight and limited at end ROM, Lt side mostly. Pt reports feeling looser in her neck at end of session as well.    Rehab Potential Good   Clinical Impairments Affecting Rehab Potential currently getting chemo every two weeks   PT Frequency 2x / week   PT Duration 8 weeks   PT Treatment/Interventions ADLs/Self Care Home Management;Electrical Stimulation;Moist Heat;Therapeutic exercise;Patient/family education;Manual techniques;Passive range of motion;Taping   PT Next  Visit Plan Cont stationary bike x 10 minutes and then elliptical to work on endurance, cont UE strength and manual therapy prn to neck/upper back.    Consulted and Agree  with Plan of Care Patient      Patient will benefit from skilled therapeutic intervention in order to improve the following deficits and impairments:  Decreased endurance, Decreased activity tolerance, Pain, Postural dysfunction  Visit Diagnosis: Pain in thoracic spine  Other symptoms and signs involving the musculoskeletal system  Difficulty in walking, not elsewhere classified     Problem List Patient Active Problem List   Diagnosis Date Noted  . Iron deficiency anemia due to chronic blood loss 10/12/2016  . Hodgkin's lymphoma ([place]) 10/10/2016    Otelia Limes, PTA 01/29/2017, 11:14 AM  [place], [place], [address] Phone: [phone]   Fax:  [phone]  Name: Maylyn Narvaiz MRN: [id number] Date of Birth: Feb 12, 1991

## 2017-01-31 ENCOUNTER — Ambulatory Visit: Payer: Self-pay

## 2017-01-31 DIAGNOSIS — R29898 Other symptoms and signs involving the musculoskeletal system: Secondary | ICD-10-CM

## 2017-01-31 DIAGNOSIS — M546 Pain in thoracic spine: Secondary | ICD-10-CM

## 2017-01-31 DIAGNOSIS — R262 Difficulty in walking, not elsewhere classified: Secondary | ICD-10-CM

## 2017-01-31 NOTE — Therapy (Signed)
[place], [place], [address] Phone: [phone]   Fax:  [phone]  Physical Therapy Treatment  Patient Details  Name: Leah Floyd MRN: [id number] Date of Birth: 1990/07/04 Referring Provider: Dr. Sullivan Lone  Encounter Date: 01/31/2017      PT End of Session - 01/31/17 1057    Visit Number 5   Number of Visits 17   Date for PT Re-Evaluation 03/18/17   PT Start Time 1025   PT Stop Time 1107   PT Time Calculation (min) 42 min   Activity Tolerance Patient tolerated treatment well   Behavior During Therapy [facility] for tasks assessed/performed      Past Medical History:  Diagnosis Date  . Cancer [facility])    lymphoma     Past Surgical History:  Procedure Laterality Date  . ANTERIOR CRUCIATE LIGAMENT REPAIR     left  . HERNIA REPAIR     left  . IR FLUORO GUIDE PORT INSERTION RIGHT  10/04/2016  . IR US GUIDE VASC ACCESS RIGHT  10/04/2016  . TONSILLECTOMY      There were no vitals filed for this visit.      Subjective Assessment - 01/31/17 1038    Subjective I felt really good after last session and actually had a full day after that. I walked alot running errands, then drove to [place] to visit my brother and nephew and got home late and was fine. My neck feels good today as well, just some tightness.    Pertinent History Diagnosed with Hodgkin's lymphoma 09/21/16. Was living in [place] before that and had gotten sick in November, but was thought to have an infection or flu; large lymph node went away but a month later came back.  Was thought to have autoimmune disease.  Then insurance ran out.  Had a number of lumps at axillae and groin and was told she might have cancer. Biopsy showed cancer and she moved back here.  Started chemo soon after that and is still on chemo.  Was diagnosed at stage IV. Was in school in [place] and then online, bt that became too stressful.  Has chemo every two weeks;  feels bad right away after chemo for a week+, then has 3-4 good days before the next chemo. Is at round8 of 12 now. Had PET scan two weeks ago that showed 90% improvement so is still on chemo. Will have her next chemo 01/18/17.  Body has been achy this time.  No other health issues.  Had torn left ACL in 2010 and had surgery for that; recovered enough to go back to basketball.   Patient Stated Goals Feel a little better, more energy, less pain.   Currently in Pain? No/denies                         [facility] Adult PT Treatment/Exercise - 01/31/17 0001      Lumbar Exercises: Aerobic   Elliptical On QuickStart for 15  mins with therapist monitoring pt throughout for overincreased exertion; HR 155 and SpO2 98% after.      Knee/Hip Exercises: Machines for Strengthening   Cybex Knee Extension 20# 2x10   Cybex Knee Flexion 45# 2x10   Total Gym Leg Press 75# x20     Shoulder Exercises: Standing   Horizontal ABduction Strengthening;Both;15 reps;Theraband  With back, shoulders, head against wall with core engaged   Theraband Level (Shoulder Horizontal ABduction) Level 2 (Red)  External Rotation Strengthening;Both;15 reps;Theraband  With back, shoulders, and head against wall and core engaged   Theraband Level (Shoulder External Rotation) Level 2 (Red)   Flexion Strengthening;Both;10 reps;Theraband  With back, shoulders, and head against wall and core engaged   Theraband Level (Shoulder Flexion) Level 2 (Red)   Flexion Limitations Narrow grip only today   Other Standing Exercises Bil D2 with red theraband with back, shoulders, and head against wall and core engaged, 10 times each UE.     Shoulder Exercises: ROM/Strengthening   Cybex Press 20 reps  35# for chest press   Cybex Row 20 reps  25#   Cybex Row Limitations VC for core engaged for correct posture                   Short Term Clinic Goals - 01/29/17 1042      CC Short Term Goal  #1   Title Pt. will report  at least 50% decrease in pain in thoracic back at area between scapulae   Baseline 65% improvement reported at this time-01/29/17   Status Achieved     CC Short Term Goal  #2   Title Pt. will tolerate at least 15 minutes of nonstops mild to moderate exercise without significant shortness of breath or O2 saturation below 90%   Baseline Pt tolerated 15 mins of cardio (bike 10' and elliptical 5') with SpO2 being no less than 98% - 01/29/17   Status Achieved             Long Term Clinic Goals - 01/31/17 1101      CC Long Term Goal  #1   Title Pt. will report at least 75% decrease in pain at thoracic back at area between scapulae.   Baseline 75% improvement reported at this time-01/31/17   Status Achieved     CC Long Term Goal  #2   Title Pt. will be able to walk nonstop for 25 minutes at mild to moderate pace without needing to stop due to shortness of breath or leg pain.   Baseline 30 mins is limit now and that's just due to overall fatigue, not SOB-01/31/17   Status Achieved     CC Long Term Goal  #3   Title Pt. will be independent in a home exercise program for endurance and strengthening   Baseline Pt has been walking more and is consistent with HEP at this time which we are cont to progress as she tolerates.-01/31/17   Status On-going     CC Long Term Goal  #4   Title Pt. will be knowledgeable about community programs she can transition to for continuing exercise.   Baseline Have discussed [employer] and PREP progream with pt, she is still deciding-01/31/17   Status Partially Met            Plan - 01/31/17 1058    Clinical Impression Statement Ptogressed pt today to include all exercises for duration of treatment including weighted machine exercises. She tolerated this great and reported feeling good after session today. Her HR after consistent 15 mins of cardio was lower today by 20 bpm compared to last session and with more increased cardio. Educated and issued handout for  [employer] progream for pt to look into.   Rehab Potential Good   Clinical Impairments Affecting Rehab Potential currently getting chemo every two weeks   PT Frequency 2x / week   PT Duration 8 weeks   PT Treatment/Interventions ADLs/Self Care Home Management;Electrical Stimulation;Moist  Heat;Therapeutic exercise;Patient/family education;Manual techniques;Passive range of motion;Taping   PT Next Visit Plan Cont cardio and bil LE/UE strength; assess how pt tolerated session today.   Consulted and Agree with Plan of Care Patient      Patient will benefit from skilled therapeutic intervention in order to improve the following deficits and impairments:  Decreased endurance, Decreased activity tolerance, Pain, Postural dysfunction  Visit Diagnosis: Pain in thoracic spine  Other symptoms and signs involving the musculoskeletal system  Difficulty in walking, not elsewhere classified     Problem List Patient Active Problem List   Diagnosis Date Noted  . Iron deficiency anemia due to chronic blood loss 10/12/2016  . Hodgkin's lymphoma ([place]) 10/10/2016    Otelia Limes, PTA 01/31/2017, 11:09 AM  [place], [place], [address] Phone: [phone]   Fax:  [phone]  Name: Shadoe Cryan MRN: [id number] Date of Birth: 02-02-1991

## 2017-02-01 ENCOUNTER — Ambulatory Visit (HOSPITAL_BASED_OUTPATIENT_CLINIC_OR_DEPARTMENT_OTHER): Payer: Self-pay | Admitting: Hematology

## 2017-02-01 ENCOUNTER — Telehealth: Payer: Self-pay | Admitting: Hematology

## 2017-02-01 ENCOUNTER — Other Ambulatory Visit (HOSPITAL_BASED_OUTPATIENT_CLINIC_OR_DEPARTMENT_OTHER): Payer: Self-pay

## 2017-02-01 ENCOUNTER — Ambulatory Visit (HOSPITAL_BASED_OUTPATIENT_CLINIC_OR_DEPARTMENT_OTHER): Payer: Self-pay

## 2017-02-01 ENCOUNTER — Encounter: Payer: Self-pay | Admitting: Hematology

## 2017-02-01 VITALS — BP 125/88 | HR 81 | Temp 98.5°F | Resp 18 | Ht 61.0 in | Wt 172.5 lb

## 2017-02-01 DIAGNOSIS — Z5111 Encounter for antineoplastic chemotherapy: Secondary | ICD-10-CM

## 2017-02-01 DIAGNOSIS — K1231 Oral mucositis (ulcerative) due to antineoplastic therapy: Secondary | ICD-10-CM

## 2017-02-01 DIAGNOSIS — Z5112 Encounter for antineoplastic immunotherapy: Secondary | ICD-10-CM

## 2017-02-01 DIAGNOSIS — C8170 Other classical Hodgkin lymphoma, unspecified site: Secondary | ICD-10-CM

## 2017-02-01 DIAGNOSIS — K219 Gastro-esophageal reflux disease without esophagitis: Secondary | ICD-10-CM

## 2017-02-01 DIAGNOSIS — D509 Iron deficiency anemia, unspecified: Secondary | ICD-10-CM

## 2017-02-01 DIAGNOSIS — C8194 Hodgkin lymphoma, unspecified, lymph nodes of axilla and upper limb: Secondary | ICD-10-CM

## 2017-02-01 DIAGNOSIS — Z5189 Encounter for other specified aftercare: Secondary | ICD-10-CM

## 2017-02-01 DIAGNOSIS — T451X5A Adverse effect of antineoplastic and immunosuppressive drugs, initial encounter: Secondary | ICD-10-CM

## 2017-02-01 DIAGNOSIS — C8198 Hodgkin lymphoma, unspecified, lymph nodes of multiple sites: Secondary | ICD-10-CM

## 2017-02-01 DIAGNOSIS — G620 Drug-induced polyneuropathy: Secondary | ICD-10-CM

## 2017-02-01 LAB — COMPREHENSIVE METABOLIC PANEL
ALBUMIN: 3.3 g/dL — AB (ref 3.5–5.0)
ALK PHOS: 69 U/L (ref 40–150)
ALT: 13 U/L (ref 0–55)
AST: 18 U/L (ref 5–34)
Anion Gap: 6 mEq/L (ref 3–11)
BUN: 2.7 mg/dL — AB (ref 7.0–26.0)
CO2: 29 meq/L (ref 22–29)
Calcium: 9.4 mg/dL (ref 8.4–10.4)
Chloride: 106 mEq/L (ref 98–109)
Creatinine: 0.7 mg/dL (ref 0.6–1.1)
EGFR: >90 mL/min/{1.73_m2} (ref >90)
GLUCOSE: 84 mg/dL (ref 70–140)
Potassium: 3.6 mEq/L (ref 3.5–5.1)
SODIUM: 140 meq/L (ref 136–145)
TOTAL PROTEIN: 6.5 g/dL (ref 6.4–8.3)
Total Bilirubin: <0.22 mg/dL (ref 0.20–1.20)

## 2017-02-01 LAB — CBC & DIFF AND RETIC
BASO%: 1.1 % (ref 0.0–2.0)
Basophils Absolute: 0.1 10*3/uL (ref 0.0–0.1)
EOS ABS: 0.7 10*3/uL — AB (ref 0.0–0.5)
EOS%: 6.2 % (ref 0.0–7.0)
HCT: 35.5 % (ref 34.8–46.6)
HEMOGLOBIN: 11.7 g/dL (ref 11.6–15.9)
IMMATURE RETIC FRACT: 12.6 % — AB (ref 1.60–10.00)
LYMPH%: 13.6 % — ABNORMAL LOW (ref 14.0–49.7)
MCH: 30.6 pg (ref 25.1–34.0)
MCHC: 33 g/dL (ref 31.5–36.0)
MCV: 92.9 fL (ref 79.5–101.0)
MONO#: 1 10*3/uL — AB (ref 0.1–0.9)
MONO%: 9.3 % (ref 0.0–14.0)
NEUT%: 69.8 % (ref 38.4–76.8)
NEUTROS ABS: 7.6 10*3/uL — AB (ref 1.5–6.5)
Platelets: 288 10*3/uL (ref 145–400)
RBC: 3.82 10*6/uL (ref 3.70–5.45)
RDW: 16.7 % — AB (ref 11.2–14.5)
RETIC %: 5.71 % — AB (ref 0.70–2.10)
Retic Ct Abs: 218.12 10*3/uL — ABNORMAL HIGH (ref 33.70–90.70)
WBC: 10.9 10*3/uL — AB (ref 3.9–10.3)
lymph#: 1.5 10*3/uL (ref 0.9–3.3)

## 2017-02-01 MED ORDER — SODIUM CHLORIDE 0.9 % IV SOLN
375.0000 mg/m2 | Freq: Once | INTRAVENOUS | Status: AC
  Administered 2017-02-01: 660 mg via INTRAVENOUS
  Filled 2017-02-01: qty 33

## 2017-02-01 MED ORDER — PALONOSETRON HCL INJECTION 0.25 MG/5ML
INTRAVENOUS | Status: AC
  Filled 2017-02-01: qty 5

## 2017-02-01 MED ORDER — SODIUM CHLORIDE 0.9 % IV SOLN
Freq: Once | INTRAVENOUS | Status: AC
  Administered 2017-02-01: 12:00:00 via INTRAVENOUS

## 2017-02-01 MED ORDER — PANTOPRAZOLE SODIUM 40 MG PO TBEC: 40.0000 mg | DELAYED_RELEASE_TABLET | Freq: Every day | ORAL | 1 refills | Status: DC

## 2017-02-01 MED ORDER — SODIUM CHLORIDE 0.9% FLUSH
10.0000 mL | INTRAVENOUS | Status: DC | PRN
Start: 2017-02-01 — End: 2017-02-01
  Administered 2017-02-01: 10 mL
  Filled 2017-02-01: qty 10

## 2017-02-01 MED ORDER — PEGFILGRASTIM 6 MG/0.6ML ~~LOC~~ PSKT
6.0000 mg | PREFILLED_SYRINGE | Freq: Once | SUBCUTANEOUS | Status: AC
  Administered 2017-02-01: 6 mg via SUBCUTANEOUS
  Filled 2017-02-01: qty 0.6

## 2017-02-01 MED ORDER — SODIUM CHLORIDE 0.9 % IV SOLN
1.2000 mg/kg | Freq: Once | INTRAVENOUS | Status: AC
  Administered 2017-02-01: 90 mg via INTRAVENOUS
  Filled 2017-02-01: qty 18

## 2017-02-01 MED ORDER — DOXORUBICIN HCL CHEMO IV INJECTION 2 MG/ML
25.0000 mg/m2 | Freq: Once | INTRAVENOUS | Status: AC
  Administered 2017-02-01: 44 mg via INTRAVENOUS
  Filled 2017-02-01: qty 22

## 2017-02-01 MED ORDER — VINBLASTINE SULFATE CHEMO INJECTION 1 MG/ML
5.6000 mg/m2 | Freq: Once | INTRAVENOUS | Status: AC
  Administered 2017-02-01: 10 mg via INTRAVENOUS
  Filled 2017-02-01: qty 10

## 2017-02-01 MED ORDER — DULOXETINE HCL 30 MG PO CPEP: 60.0000 mg | ORAL_CAPSULE | Freq: Every day | ORAL | 1 refills | Status: DC

## 2017-02-01 MED ORDER — HEPARIN SOD (PORK) LOCK FLUSH 100 UNIT/ML IV SOLN
500.0000 [IU] | Freq: Once | INTRAVENOUS | Status: AC | PRN
  Administered 2017-02-01: 500 [IU]
  Filled 2017-02-01: qty 5

## 2017-02-01 MED ORDER — SODIUM CHLORIDE 0.9 % IV SOLN
Freq: Once | INTRAVENOUS | Status: AC
  Administered 2017-02-01: 12:00:00 via INTRAVENOUS
  Filled 2017-02-01: qty 5

## 2017-02-01 MED ORDER — PALONOSETRON HCL INJECTION 0.25 MG/5ML
0.2500 mg | Freq: Once | INTRAVENOUS | Status: AC
  Administered 2017-02-01: 0.25 mg via INTRAVENOUS

## 2017-02-01 MED FILL — DULoxetine HCL 30 MG CPEP: 30 | 30 days supply | Qty: 60 | Fill #0

## 2017-02-01 MED FILL — PANTOPRAZOLE SOD DR 40 MG T: 40 | 30 days supply | Qty: 30 | Fill #0

## 2017-02-01 NOTE — Patient Instructions (Signed)
[facility] Discharge Instructions for Patients Receiving Chemotherapy  Today you received the following chemotherapy agents adcetris/vinblastine/adriamycin/dacarbazine  To help prevent nausea and vomiting after your treatment, we encourage you to take your nausea medication as directed  If you develop nausea and vomiting that is not controlled by your nausea medication, call the clinic.   BELOW ARE SYMPTOMS THAT SHOULD BE REPORTED IMMEDIATELY:  *FEVER GREATER THAN 100.5 F  *CHILLS WITH OR WITHOUT FEVER  NAUSEA AND VOMITING THAT IS NOT CONTROLLED WITH YOUR NAUSEA MEDICATION  *UNUSUAL SHORTNESS OF BREATH  *UNUSUAL BRUISING OR BLEEDING  TENDERNESS IN MOUTH AND THROAT WITH OR WITHOUT PRESENCE OF ULCERS  *URINARY PROBLEMS  *BOWEL PROBLEMS  UNUSUAL RASH Items with * indicate a potential emergency and should be followed up as soon as possible.  Feel free to call the clinic you have any questions or concerns. The clinic phone number is [phone].

## 2017-02-01 NOTE — Patient Instructions (Signed)
Thank you for choosing [facility] to provide your oncology and hematology care.  To afford each patient quality time with our providers, please arrive 30 minutes before your scheduled appointment time.  If you arrive late for your appointment, you may be asked to reschedule.  We strive to give you quality time with our providers, and arriving late affects you and other patients whose appointments are after yours.  If you are a no show for multiple scheduled visits, you may be dismissed from the clinic at the providers discretion.   Again, thank you for choosing [facility], our hope is that these requests will decrease the amount of time that you wait before being seen by our physicians.  ______________________________________________________________________ Should you have questions after your visit to the [facility], please contact our office at [phone] between the hours of 8:30 and 4:30 p.m.    Voicemails left after 4:30p.m will not be returned until the following business day.   For prescription refill requests, please have your pharmacy contact us directly.  Please also try to allow 48 hours for prescription requests.   Please contact the scheduling department for questions regarding scheduling.  For scheduling of procedures such as PET scans, CT scans, MRI, Ultrasound, etc please contact central scheduling at [phone].   Resources For Cancer Patients and Caregivers:  American Cancer Society:  800-227-2345  Can help patients locate various types of support and financial assistance Cancer Care: 1-800-813-HOPE (4673) Provides financial assistance, online support groups, medication/co-pay assistance.   [place] DSS:  336-641-3447 Where to apply for food stamps, Medicaid, and utility assistance Medicare Rights Center: 800-333-4114 Helps people with Medicare understand their rights and benefits, navigate the Medicare system, and secure the  quality healthcare they deserve SCAT: 336-333-6589 [place] [employer]'s shared-ride transportation service for eligible riders who have a disability that prevents them from riding the fixed route bus.   For additional information on assistance programs please contact our social worker:   Grier Hock/Abigail Elmore:  [phone] 

## 2017-02-01 NOTE — Telephone Encounter (Signed)
Scheduled appt per 8/31 los - patient to get new schedule in the treatment area

## 2017-02-05 ENCOUNTER — Ambulatory Visit: Payer: Self-pay | Admitting: Physical Therapy

## 2017-02-08 ENCOUNTER — Ambulatory Visit: Payer: Self-pay | Attending: Hematology | Admitting: Physical Therapy

## 2017-02-08 VITALS — HR 152

## 2017-02-08 DIAGNOSIS — R262 Difficulty in walking, not elsewhere classified: Secondary | ICD-10-CM | POA: Insufficient documentation

## 2017-02-08 DIAGNOSIS — M546 Pain in thoracic spine: Secondary | ICD-10-CM | POA: Insufficient documentation

## 2017-02-08 DIAGNOSIS — R29898 Other symptoms and signs involving the musculoskeletal system: Secondary | ICD-10-CM | POA: Insufficient documentation

## 2017-02-08 MED FILL — SENNA PLUS 8.6-50 MG TAB: 8.6-50 | 50 days supply | Qty: 100 | Fill #0

## 2017-02-08 NOTE — Therapy (Signed)
[place], [place], [address] Phone: [phone]   Fax:  [phone]  Physical Therapy Treatment  Patient Details  Name: Leah Floyd MRN: [id number] Date of Birth: December 11, 1990 Referring Provider: Dr. Sullivan Lone  Encounter Date: 02/08/2017      PT End of Session - 02/08/17 1208    Visit Number 6   Number of Visits 17   Date for PT Re-Evaluation 03/18/17   PT Start Time 1019   PT Stop Time 1103   PT Time Calculation (min) 44 min   Activity Tolerance Patient tolerated treatment well   Behavior During Therapy [facility] for tasks assessed/performed      Past Medical History:  Diagnosis Date  . Cancer [facility])    lymphoma     Past Surgical History:  Procedure Laterality Date  . ANTERIOR CRUCIATE LIGAMENT REPAIR     left  . HERNIA REPAIR     left  . IR FLUORO GUIDE PORT INSERTION RIGHT  10/04/2016  . IR US GUIDE VASC ACCESS RIGHT  10/04/2016  . TONSILLECTOMY      Vitals:   02/08/17 1022 02/08/17 1039 02/08/17 1103  Pulse: (!) 170 (!) 150 (!) 152  SpO2: 98% 98% 98%        Subjective Assessment - 02/08/17 1022    Subjective He bumped up my chemo medicines last Friday (8/31); that's why I missed Tuesday because it aggravated by neuropathy pain. I haven't gotten too much sleep this week.  It's been night sweats and pain; my back's hurting again. It's a little further down, between the shoulder blades now. Chemo goes till 10/15, so she has 3 left.   Currently in Pain? Yes   Pain Score 5    Pain Location Back   Pain Orientation Upper;Mid  between shoulder blades   Pain Descriptors / Indicators Pressure   Aggravating Factors  maybe sleeping position   Pain Relieving Factors exercise with the elastic bands helps it feel not so tight                         [facility] Adult PT Treatment/Exercise - 02/08/17 0001      Elbow Exercises   Elbow Extension Strengthening;Both;20  reps;Standing;Other (comment)  at Omega machine, 20#     Lumbar Exercises: Aerobic   Elliptical On Quickstart x 12 minutes  fatigue limited     Knee/Hip Exercises: Machines for Strengthening   Cybex Knee Extension 20# 2x10   Cybex Knee Flexion 45# 2x10   Cybex Leg Press 80# x 10 x 2     Shoulder Exercises: ROM/Strengthening   Cybex Press 20 reps  35# for chest press   Cybex Row 20 reps  25# (Omega machine)   Other ROM/Strengthening Exercises seated lat pulldown on Omega machine, 35# x 20                   Short Term Clinic Goals - 01/29/17 1042      CC Short Term Goal  #1   Title Pt. will report at least 50% decrease in pain in thoracic back at area between scapulae   Baseline 65% improvement reported at this time-01/29/17   Status Achieved     CC Short Term Goal  #2   Title Pt. will tolerate at least 15 minutes of nonstops mild to moderate exercise without significant shortness of breath or O2 saturation below 90%   Baseline Pt tolerated 15 mins  of cardio (bike 10' and elliptical 5') with SpO2 being no less than 98% - 01/29/17   Status Achieved             Long Term Clinic Goals - 01/31/17 1101      CC Long Term Goal  #1   Title Pt. will report at least 75% decrease in pain at thoracic back at area between scapulae.   Baseline 75% improvement reported at this time-01/31/17   Status Achieved     CC Long Term Goal  #2   Title Pt. will be able to walk nonstop for 25 minutes at mild to moderate pace without needing to stop due to shortness of breath or leg pain.   Baseline 30 mins is limit now and that's just due to overall fatigue, not SOB-01/31/17   Status Achieved     CC Long Term Goal  #3   Title Pt. will be independent in a home exercise program for endurance and strengthening   Baseline Pt has been walking more and is consistent with HEP at this time which we are cont to progress as she tolerates.-01/31/17   Status On-going     CC Long Term Goal  #4    Title Pt. will be knowledgeable about community programs she can transition to for continuing exercise.   Baseline Have discussed [employer] and PREP progream with pt, she is still deciding-01/31/17   Status Partially Met            Plan - 02/08/17 1208    Clinical Impression Statement Pt. feeling stiff and tired today but still did quite well with exercises.  She could not do as long on the elliptical, but did well with strengthening exercises.  Heart rate was at training level and O2 sats stayed in high 90s througout. Her back was hurting again today at start of session, but she did not complain during exercise.   Rehab Potential Good   Clinical Impairments Affecting Rehab Potential currently getting chemo every two weeks; will finish 03/18/17   PT Frequency 2x / week   PT Duration 8 weeks   PT Treatment/Interventions ADLs/Self Care Home Management;Electrical Stimulation;Moist Heat;Therapeutic exercise;Patient/family education;Manual techniques;Passive range of motion;Taping   PT Next Visit Plan Cont cardio and bil LE/UE strength   Consulted and Agree with Plan of Care Patient      Patient will benefit from skilled therapeutic intervention in order to improve the following deficits and impairments:  Decreased endurance, Decreased activity tolerance, Pain, Postural dysfunction  Visit Diagnosis: Pain in thoracic spine  Other symptoms and signs involving the musculoskeletal system  Difficulty in walking, not elsewhere classified     Problem List Patient Active Problem List   Diagnosis Date Noted  . Iron deficiency anemia due to chronic blood loss 10/12/2016  . Hodgkin's lymphoma ([name]) 10/10/2016    SALISBURY,[name] 02/08/2017, 12:13 PM  [place], [place], [address] Phone: [phone]   Fax:  [phone]  Name: Leah Floyd MRN: [id number] Date of Birth: 1990/09/20  Serafina Royals,  PT 02/08/17 12:13 PM

## 2017-02-10 NOTE — Progress Notes (Signed)
[name]    HEMATOLOGY/ONCOLOGY CLINIC NOTE  Date of Service: .02/01/2017  Patient Care Team: Brunetta Genera, MD as PCP - General (Hematology) Patient, No Pcp Per (General Practice)  CHIEF COMPLAINTS/PURPOSE OF CONSULTATION:  F/u classical Hodgkin's lymphoma  HISTORY OF PRESENTING ILLNESS:   Leah Floyd is a wonderful 26 y.o. female who has been referred to [place] by [employer] [place] for evaluation and management of newly diagnosed classical Hodgkin's lymphoma.  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  She notes that in November 2017 she developed generalized body aches and flulike symptoms with some subjective fevers chills and night sweats. A primary care physician noted a right axillary lump and she was treated empirically with Z-Pak for possible infection. She notes that around Christmas time in 2017 she developed worsening fatigue and generalized body aches a point that she was having difficulties getting through the day. She notes that she had an autoimmune workup HIV testing which was unrevealing she was noted to have some iron deficiency. She notes that she had a follow-up ultrasound that showed right axillary lymph node was enlarging and she also had a left axillary lymph node and had noted inguinal lymph nodes in the groin. Some of her workup was delayed due to insurance issues that she eventually had a left axillary lymph node biopsy on 09/24/2016 accession number [id number] which showed classical Hodgkin's lymphoma.  Patient then has moved back from [place] to be with her mother and family in [place] not, and is here to seek further cares for her classical Hodgkin's lymphoma newly diagnosed.  Patient notes significant fatigue, night sweats, weight loss of about 40 pounds in  the last 4-5 months, generalized body aches, dry cough and loss of appetite.  She notes that she previously had heavy menstrual periods lasting 7 days which are now become lighter and last about 4 days. She has been taking ferrous sulfate plus vitamin C for her iron deficiency anemia.  No previous heart problems. Has been having some mild diarrhea which she describes as soft stools 2-3 times a day.  INTERVAL HISTORY  Ms Ripley is here with here for f/u prior to her C5D1 of A-AVD. She notes she is feeling much better and doing well with outpatient rehab. NOtes she is emotional in a better place as well. She is okay with increasing the Cymbalta to 60mg  to help with myalgia and also her mood. She would like to go back up on the Brentuximab dose to the standard dose. No fevers/chills/night sweats.  MEDICAL HISTORY:   #1 iron deficiency anemia #2 GERD #3 obesity. #4 pseudotumor cerebri however this has resolved with her weight loss as per patient. #5 asthma #6 of newly diagnosed Hodgkin's lymphoma #7 shellfish allergy #8 history of Chlamydia infection   SURGICAL HISTORY: Past Surgical History:  Procedure Laterality Date  . ANTERIOR CRUCIATE LIGAMENT REPAIR     left  . HERNIA REPAIR     left  . IR FLUORO GUIDE PORT INSERTION RIGHT  10/04/2016  . IR US GUIDE VASC ACCESS RIGHT  10/04/2016  . TONSILLECTOMY      SOCIAL HISTORY: Social History   Social History  . Marital status: Single    Spouse name: N/A  . Number of children: N/A  . Years of education: N/A   Occupational History  . Not on  file.   Social History Main Topics  . Smoking status: Never Smoker  . Smokeless tobacco: Never Used  . Alcohol use Yes     Comment: socially  . Drug use: Yes    Types: Marijuana     Comment: medical   . Sexual activity: Not on file   Other Topics Concern  . Not on file   Social History Narrative  . No narrative on file    FAMILY HISTORY: Notes her first cousin on her mom's side  had some form off rare bone marrow cancer Does not know history on her father's side of the family Maternal grandmother-hypertension, diabetes, fibromyalgia Mother anemia Other relatives on her mom's side with lymphoma and bladder cancer.  ALLERGIES:  is allergic to shellfish-derived products.  MEDICATIONS:  Current Outpatient Prescriptions  Medication Sig Dispense Refill  . albuterol (PROVENTIL HFA;VENTOLIN HFA) 108 (90 Base) MCG/ACT inhaler Inhale 2 puffs into the lungs every 6 (six) hours as needed for wheezing or shortness of breath. 1 Inhaler 2  . Ascorbic Acid (VITAMIN C PO) Take by mouth.    . Cyanocobalamin (VITAMIN B-12 CR PO) Take 1 tablet by mouth daily.    [name] dexamethasone (DECADRON) 4 MG tablet Take 2 tablets by mouth once a day on the day after chemotherapy and then take 2 tablets two times a day for 2 days. Take with food. 30 tablet 1  . diphenhydramine-acetaminophen (TYLENOL PM) 25-500 MG TABS tablet Take 2 tablets by mouth at bedtime as needed.    . DULoxetine (CYMBALTA) 30 MG capsule Take 2 capsules (60 mg total) by mouth daily. 60 capsule 1  . EPINEPHrine 0.3 mg/0.3 mL IJ SOAJ injection Inject 0.3 mLs (0.3 mg total) into the muscle once. For anaphylactic reactions 1 Device 0  . HYDROcodone-acetaminophen (NORCO/VICODIN) 5-325 MG tablet Take 1-2 tablets by mouth every 6 (six) hours as needed for moderate pain or severe pain. 30 tablet 0  . lidocaine-prilocaine (EMLA) cream Apply to affected area once 30 g 3  . loratadine (CLARITIN) 10 MG tablet Take 10 mg by mouth daily.    [name] LORazepam (ATIVAN) 0.5 MG tablet Take 1 tablet (0.5 mg total) by mouth every 6 (six) hours as needed (Nausea or vomiting). 30 tablet 0  . LORazepam (ATIVAN) 0.5 MG tablet Take 1 tablet (0.5 mg total) by mouth every 6 (six) hours as needed (Nausea or vomiting). 30 tablet 0  . magic mouthwash w/lidocaine SOLN Take 5 mLs by mouth 4 (four) times daily as needed for mouth pain. 120 mL 0  . ondansetron (ZOFRAN)  8 MG tablet Take 1 tablet (8 mg total) by mouth 2 (two) times daily as needed. Start on the third day after chemotherapy. 30 tablet 1  . pantoprazole (PROTONIX) 40 MG tablet Take 1 tablet (40 mg total) by mouth daily. 30 tablet 1  . PEGFILGRASTIM Advance Apply 1 patch topically once.    . prochlorperazine (COMPAZINE) 10 MG tablet Take 1 tablet (10 mg total) by mouth every 6 (six) hours as needed (Nausea or vomiting). 30 tablet 1  . senna-docusate (SENNA S) 8.6-50 MG tablet Take 2 tablets by mouth at bedtime. Hold if diarrhea ensues 60 tablet 1  . sucralfate (CARAFATE) 1 GM/10ML suspension Take 10 mLs (1 g total) by mouth 4 (four) times daily -  with meals and at bedtime. 420 mL 0   No current facility-administered medications for this visit.     REVIEW OF SYSTEMS:    10 Point review of  Systems was done is negative except as noted above.  PHYSICAL EXAMINATION: ECOG PERFORMANCE STATUS: 1 - Symptomatic but completely ambulatory  . Vitals:   02/01/17 1052  BP: 125/88  Pulse: 81  Resp: 18  Temp: 98.5 F (36.9 C)  SpO2: 100%   Filed Weights   02/01/17 1052  Weight: 172 lb 8 oz (78.2 kg)   .Body mass index is 32.59 kg/m.  GENERAL:alert, in no acute distress and comfortable SKIN: no acute rashes, no significant lesions EYES: conjunctiva are pink and non-injected, sclera anicteric OROPHARYNX: MMM, no exudates, no oropharyngeal erythema or ulceration NECK: supple, no JVD LYMPH:  Patient has palpable lymphadenopathy bilaterally in her neck , occipital area, supraclavicular, bilateral axillary and inguinal areas  LUNGS: clear to auscultation b/l with normal respiratory effort HEART: regular rate & rhythm ABDOMEN:  normoactive bowel sounds , non tender, not distended. Borderline palpable splenomegaly, no palpable hepatomegaly. Extremity: no pedal edema PSYCH: alert & oriented x 3 with fluent speech NEURO: no focal motor/sensory deficits  LABORATORY DATA:  I have reviewed the data as  listed  . CBC Latest Ref Rng & Units 02/01/2017 01/18/2017 01/04/2017  WBC 3.9 - 10.3 10e3/uL 10.9(H) 7.9 10.1  Hemoglobin 11.6 - 15.9 g/dL 11.7 12.3 13.1  Hematocrit 34.8 - 46.6 % 35.5 36.5 38.4  Platelets 145 - 400 10e3/uL 288 268 319   ANC 4.7k . CBC    Component Value Date/Time   WBC 10.9 (H) 02/01/2017 1008   WBC 9.1 12/31/2016 0300   RBC 3.82 02/01/2017 1008   RBC 3.97 12/31/2016 0300   HGB 11.7 02/01/2017 1008   HCT 35.5 02/01/2017 1008   PLT 288 02/01/2017 1008   MCV 92.9 02/01/2017 1008   MCH 30.6 02/01/2017 1008   MCH 30.2 12/31/2016 0300   MCHC 33.0 02/01/2017 1008   MCHC 35.2 12/31/2016 0300   RDW 16.7 (H) 02/01/2017 1008   LYMPHSABS 1.5 02/01/2017 1008   MONOABS 1.0 (H) 02/01/2017 1008   EOSABS 0.7 (H) 02/01/2017 1008   BASOSABS 0.1 02/01/2017 1008    . CMP Latest Ref Rng & Units 02/01/2017 01/18/2017 01/04/2017  Glucose 70 - 140 mg/dl 84 76 95  BUN 7.0 - 26.0 mg/dL 2.7(L) 5.6(L) 3.1(L)  Creatinine 0.6 - 1.1 mg/dL 0.7 0.7 0.8  Sodium 136 - 145 mEq/L 140 141 137  Potassium 3.5 - 5.1 mEq/L 3.6 3.8 3.8  Chloride 101 - 111 mmol/L - - -  CO2 22 - 29 mEq/L 29 27 28   Calcium 8.4 - 10.4 mg/dL 9.4 9.7 10.0  Total Protein 6.4 - 8.3 g/dL 6.5 6.7 7.2  Total Bilirubin 0.20 - 1.20 mg/dL <0.22 <0.22 0.25  Alkaline Phos 40 - 150 U/L 69 74 74  AST 5 - 34 U/L 18 17 23   ALT 0 - 55 U/L 13 12 18    .  RADIOGRAPHIC STUDIES: I have personally reviewed the radiological images as listed and agreed with the findings in the report. No results found.  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  PET/CT after 3 cycles of treatment with excellent response to treatment thus far.  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  PLAN -labs are stable  -continue A-AVD with increase of dose of  Brentuximab back to 1.2mg /kg -vicodin prn for pain -increased Cymbalta to 60mg  po daily -continue working with rehab for strength and endurance and to help with fatigue. -discussed importance of maintaining good po food intake.   #4 Grade 1 oral mucositis and oral thrush . now resolved status post use of nystatin . Plan  - oral cryotherapy while getting chemotherapy - patient was counseled on this . - given refills for Magic mouthwash when necessary   #3 history of iron deficiency anemia Has been on oral iron that causes significant constipation and is still significantly anemic. S/p IV Injectafer 750mg  x 1 dose  hgb has improved. Ferritin adequate.  Lab Results  Component Value Date   FERRITIN 235 01/18/2017     plan  - No indication for additional iron replacement at this time  #4 history of asthma. This has been mild but tends to get worse in [place]. 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 Patient notes that she tolerates eating shrimp. -Prescribed EpiPen for when necessary use. -Recommended also to carry Benadryl.  #5 general medical cares -Patient was again recommended to establish a primary care physician as soon as possible.  please schedule remain C5 and C6 of treatment with labs prior to each treatment -RTC with Dr Irene Limbo in 4 weeks with C6D1 with labs    I spent 20 minutes counseling the patient face to face. The total time spent in the appointment was 25 minutes and more than 50% was on counseling and direct patient cares.    Sullivan Lone MD [place] AAHIVMS [facility] Hematology/Oncology Physician [facility]  (Office):       [phone] (Work cell):  [phone] (Fax):           [phone]

## 2017-02-10 NOTE — Progress Notes (Deleted)
[name]    HEMATOLOGY/ONCOLOGY CLINIC NOTE  Date of Service: 02/10/17  Patient Care Team: Brunetta Genera, MD as PCP - General (Hematology) Patient, No Pcp Per (General Practice)  CHIEF COMPLAINTS/PURPOSE OF CONSULTATION:  F/u classical Hodgkin's lymphoma  HISTORY OF PRESENTING ILLNESS:   Leah Floyd is a wonderful 26 y.o. female who has been referred to [place] by [employer] [place] for evaluation and management of newly diagnosed classical Hodgkin's lymphoma.  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  She notes that in November 2017 she developed generalized body aches and flulike symptoms with some subjective fevers chills and night sweats. A primary care physician noted a right axillary lump and she was treated empirically with Z-Pak for possible infection. She notes that around Christmas time in 2017 she developed worsening fatigue and generalized body aches a point that she was having difficulties getting through the day. She notes that she had an autoimmune workup HIV testing which was unrevealing she was noted to have some iron deficiency. She notes that she had a follow-up ultrasound that showed right axillary lymph node was enlarging and she also had a left axillary lymph node and had noted inguinal lymph nodes in the groin. Some of her workup was delayed due to insurance issues that she eventually had a left axillary lymph node biopsy on 09/24/2016 accession number [id number] which showed classical Hodgkin's lymphoma.  Patient then has moved back from [place] to be with her mother and family in [place] not, and is here to seek further cares for her classical Hodgkin's lymphoma newly diagnosed.  Patient notes significant fatigue, night sweats, weight loss of about 40 pounds in the  last 4-5 months, generalized body aches, dry cough and loss of appetite.  She notes that she previously had heavy menstrual periods lasting 7 days which are now become lighter and last about 4 days. She has been taking ferrous sulfate plus vitamin C for her iron deficiency anemia.  No previous heart problems. Has been having some mild diarrhea which she describes as soft stools 2-3 times a day.  INTERVAL HISTORY  Leah Floyd is here with her mother  for a followup prior to C4 D15 of her chemotherapy. She is much better spirits and much more comfortable physically and emotionally today. ***   On review of systems, pt denies fever, chills, weight loss, decreased appetite, decreased energy levels. Denies pain. Pt denies abdominal pain, nausea, vomiting. *** {The slightly decreased in Brentuximab . Cymbalta and outpatient cancer rehab also apears to have helped significantly.  Myalgia and fatigue improved and more manageable. No fevers or chills no night sweats.}  MEDICAL HISTORY:   #1 iron deficiency anemia #2 GERD #3 obesity. #4 pseudotumor cerebri however this has resolved with her weight loss as per patient. #5 asthma #6 of newly diagnosed Hodgkin's lymphoma #7 shellfish allergy #8 history of Chlamydia infection   SURGICAL HISTORY: Past Surgical History:  Procedure Laterality Date  . ANTERIOR CRUCIATE LIGAMENT REPAIR     left  . HERNIA REPAIR     left  . IR FLUORO GUIDE PORT INSERTION RIGHT  10/04/2016  . IR US GUIDE VASC ACCESS RIGHT  10/04/2016  . TONSILLECTOMY      SOCIAL HISTORY: Social History   Social History  . Marital status: Single    Spouse name: N/A  .  Number of children: N/A  . Years of education: N/A   Occupational History  . Not on file.   Social History Main Topics  . Smoking status: Never Smoker  . Smokeless tobacco: Never Used  . Alcohol use Yes     Comment: socially  . Drug use: Yes    Types: Marijuana     Comment: medical   . Sexual activity:  Not on file   Other Topics Concern  . Not on file   Social History Narrative  . No narrative on file    FAMILY HISTORY: Notes her first cousin on her mom's side had some form off rare bone marrow cancer Does not know history on her father's side of the family Maternal grandmother-hypertension, diabetes, fibromyalgia Mother anemia Other relatives on her mom's side with lymphoma and bladder cancer.  ALLERGIES:  is allergic to shellfish-derived products.  MEDICATIONS:  Current Outpatient Prescriptions  Medication Sig Dispense Refill  . albuterol (PROVENTIL HFA;VENTOLIN HFA) 108 (90 Base) MCG/ACT inhaler Inhale 2 puffs into the lungs every 6 (six) hours as needed for wheezing or shortness of breath. 1 Inhaler 2  . Ascorbic Acid (VITAMIN C PO) Take by mouth.    . Cyanocobalamin (VITAMIN B-12 CR PO) Take 1 tablet by mouth daily.    [name] dexamethasone (DECADRON) 4 MG tablet Take 2 tablets by mouth once a day on the day after chemotherapy and then take 2 tablets two times a day for 2 days. Take with food. 30 tablet 1  . diphenhydramine-acetaminophen (TYLENOL PM) 25-500 MG TABS tablet Take 2 tablets by mouth at bedtime as needed.    . DULoxetine (CYMBALTA) 30 MG capsule Take 2 capsules (60 mg total) by mouth daily. 60 capsule 1  . EPINEPHrine 0.3 mg/0.3 mL IJ SOAJ injection Inject 0.3 mLs (0.3 mg total) into the muscle once. For anaphylactic reactions 1 Device 0  . HYDROcodone-acetaminophen (NORCO/VICODIN) 5-325 MG tablet Take 1-2 tablets by mouth every 6 (six) hours as needed for moderate pain or severe pain. 30 tablet 0  . lidocaine-prilocaine (EMLA) cream Apply to affected area once 30 g 3  . loratadine (CLARITIN) 10 MG tablet Take 10 mg by mouth daily.    [name] LORazepam (ATIVAN) 0.5 MG tablet Take 1 tablet (0.5 mg total) by mouth every 6 (six) hours as needed (Nausea or vomiting). 30 tablet 0  . LORazepam (ATIVAN) 0.5 MG tablet Take 1 tablet (0.5 mg total) by mouth every 6 (six) hours as needed  (Nausea or vomiting). 30 tablet 0  . magic mouthwash w/lidocaine SOLN Take 5 mLs by mouth 4 (four) times daily as needed for mouth pain. 120 mL 0  . ondansetron (ZOFRAN) 8 MG tablet Take 1 tablet (8 mg total) by mouth 2 (two) times daily as needed. Start on the third day after chemotherapy. 30 tablet 1  . pantoprazole (PROTONIX) 40 MG tablet Take 1 tablet (40 mg total) by mouth daily. 30 tablet 1  . PEGFILGRASTIM Bowdle Apply 1 patch topically once.    . prochlorperazine (COMPAZINE) 10 MG tablet Take 1 tablet (10 mg total) by mouth every 6 (six) hours as needed (Nausea or vomiting). 30 tablet 1  . senna-docusate (SENNA S) 8.6-50 MG tablet Take 2 tablets by mouth at bedtime. Hold if diarrhea ensues 60 tablet 1  . sucralfate (CARAFATE) 1 GM/10ML suspension Take 10 mLs (1 g total) by mouth 4 (four) times daily -  with meals and at bedtime. 420 mL 0   No current facility-administered  medications for this visit.     REVIEW OF SYSTEMS:    10 Point review of Systems was done is negative except as noted above.  PHYSICAL EXAMINATION: *** ECOG PERFORMANCE STATUS: 1 - Symptomatic but completely ambulatory  . There were no vitals filed for this visit. There were no vitals filed for this visit. .There is no height or weight on file to calculate BMI.  GENERAL:alert, in no acute distress and comfortable SKIN: no acute rashes, no significant lesions EYES: conjunctiva are pink and non-injected, sclera anicteric OROPHARYNX: MMM, no exudates, no oropharyngeal erythema or ulceration NECK: supple, no JVD LYMPH:  Patient has palpable lymphadenopathy bilaterally in her neck , occipital area, supraclavicular, bilateral axillary and inguinal areas  LUNGS: clear to auscultation b/l with normal respiratory effort HEART: regular rate & rhythm ABDOMEN:  normoactive bowel sounds , non tender, not distended. Borderline palpable splenomegaly, no palpable hepatomegaly. Extremity: no pedal edema PSYCH: alert & oriented  x 3 with fluent speech NEURO: no focal motor/sensory deficits  LABORATORY DATA:  I have reviewed the data as listed  . CBC Latest Ref Rng & Units 02/01/2017 01/18/2017 01/04/2017  WBC 3.9 - 10.3 10e3/uL 10.9(H) 7.9 10.1  Hemoglobin 11.6 - 15.9 g/dL 11.7 12.3 13.1  Hematocrit 34.8 - 46.6 % 35.5 36.5 38.4  Platelets 145 - 400 10e3/uL 288 268 319   ANC 4.7k . CBC    Component Value Date/Time   WBC 10.9 (H) 02/01/2017 1008   WBC 9.1 12/31/2016 0300   RBC 3.82 02/01/2017 1008   RBC 3.97 12/31/2016 0300   HGB 11.7 02/01/2017 1008   HCT 35.5 02/01/2017 1008   PLT 288 02/01/2017 1008   MCV 92.9 02/01/2017 1008   MCH 30.6 02/01/2017 1008   MCH 30.2 12/31/2016 0300   MCHC 33.0 02/01/2017 1008   MCHC 35.2 12/31/2016 0300   RDW 16.7 (H) 02/01/2017 1008   LYMPHSABS 1.5 02/01/2017 1008   MONOABS 1.0 (H) 02/01/2017 1008   EOSABS 0.7 (H) 02/01/2017 1008   BASOSABS 0.1 02/01/2017 1008    . CMP Latest Ref Rng & Units 02/01/2017 01/18/2017 01/04/2017  Glucose 70 - 140 mg/dl 84 76 95  BUN 7.0 - 26.0 mg/dL 2.7(L) 5.6(L) 3.1(L)  Creatinine 0.6 - 1.1 mg/dL 0.7 0.7 0.8  Sodium 136 - 145 mEq/L 140 141 137  Potassium 3.5 - 5.1 mEq/L 3.6 3.8 3.8  Chloride 101 - 111 mmol/L - - -  CO2 22 - 29 mEq/L 29 27 28   Calcium 8.4 - 10.4 mg/dL 9.4 9.7 10.0  Total Protein 6.4 - 8.3 g/dL 6.5 6.7 7.2  Total Bilirubin 0.20 - 1.20 mg/dL <0.22 <0.22 0.25  Alkaline Phos 40 - 150 U/L 69 74 74  AST 5 - 34 U/L 18 17 23   ALT 0 - 55 U/L 13 12 18    .  RADIOGRAPHIC STUDIES: I have personally reviewed the radiological images as listed and agreed with the findings in the report. No results found.   NM PET Image Restag Skull Base to Thigh, 01/02/2017 IMPRESSION: 1. Marked reduction in size and metabolic activity of axillary adenopathy, mediastinal adenopathy, and periaortic retroperitoneal adenopathy. Activity of these nodes is similar to mediastinal activity and less than liver activity ( Deauville 2). 2. Decreased in  size and metabolic activity of bilateral iliac lymphadenopathy with enlarged lymph nodes remaining ( Deauville 2). 3. Most intense activity remains within LEFT inguinal lymph node with activity above liver activity Deauville 4. However, this lymph node was not metabolic on  comparison examination and is normal size. Potential reactive node. 4. Increase in marrow activity is favored GCSF type response. 5. Mild metabolic activity associated ground-glass opacities in the upper lobes is favored infectious or inflammatory. Correlate with drug reaction / toxicity.   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  PET/CT after 3 cycles of treatment with excellent response to treatment thus far.  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  PLAN -labs are stable  -continue A-AVD with current  dose reduction of Brentuximab to 0.9mg /kg -vicodin prn for pain -continue on Cymbalta 30mg  po daily -continue working with rehab for strength and endurance and to help with fatigue. -discussed importance of maintaining good po food intake.   #4 Grade 1 oral mucositis and oral thrush . now resolved status post use of nystatin . Plan  - oral cryotherapy while getting chemotherapy - patient was counseled on this . - given refills for Magic mouthwash when necessary   #3 history of iron deficiency anemia Has been on oral iron that causes significant constipation and is still significantly anemic. S/p IV Injectafer 750mg  x 1 dose  hgb has improved. Ferritin adequate.  Lab Results  Component Value Date   FERRITIN 235 01/18/2017     plan  - No indication for  additional iron replacement at this time  #4 history of asthma. This has been mild but tends to get worse in [place]. 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 Patient notes that she tolerates eating shrimp. -Prescribed EpiPen for when necessary use. -Recommended also to carry Benadryl.  #5 general medical cares -Patient was recommended to establish a primary care physician as soon as possible.  ***  {continue treatment and labs as per schedule q2weeks. -plz schedule all remain treatment through Cycle 6 -RTC with Dr Irene Limbo in 4 weeks with C5D15}    I spent *** minutes counseling the patient face to face. The total time spent in the appointment was *** minutes and more than 50% was on counseling and direct patient cares.    Sullivan Lone MD [place] AAHIVMS [facility] Hematology/Oncology Physician [facility]  (Office):       [phone] (Work cell):  [phone] (Fax):           [phone]  This document serves as a record of services personally performed by Sullivan Lone, MD. It was created on her behalf by Steva Colder, a trained medical scribe. The creation of this record is based on the scribe's personal observations and the provider's statements to them. This document has been checked and approved by the attending provider.

## 2017-02-12 ENCOUNTER — Ambulatory Visit: Payer: Self-pay

## 2017-02-12 DIAGNOSIS — M546 Pain in thoracic spine: Secondary | ICD-10-CM

## 2017-02-12 DIAGNOSIS — R29898 Other symptoms and signs involving the musculoskeletal system: Secondary | ICD-10-CM

## 2017-02-12 DIAGNOSIS — R262 Difficulty in walking, not elsewhere classified: Secondary | ICD-10-CM

## 2017-02-12 NOTE — Therapy (Signed)
[place], [place], [address] Phone: [phone]   Fax:  [phone]  Physical Therapy Treatment  Patient Details  Name: Leah Floyd MRN: [id number] Date of Birth: [birth year] Referring Provider: Dr. Sullivan Lone  Encounter Date: 02/12/2017      PT End of Session - 02/12/17 1053    Visit Number 7   Number of Visits 17   Date for PT Re-Evaluation 03/18/17   PT Start Time 1018   PT Stop Time 1108   PT Time Calculation (min) 50 min   Activity Tolerance Patient tolerated treatment well   Behavior During Therapy [facility] for tasks assessed/performed      Past Medical History:  Diagnosis Date  . Cancer [facility])    lymphoma     Past Surgical History:  Procedure Laterality Date  . ANTERIOR CRUCIATE LIGAMENT REPAIR     left  . HERNIA REPAIR     left  . IR FLUORO GUIDE PORT INSERTION RIGHT  10/04/2016  . IR US GUIDE VASC ACCESS RIGHT  10/04/2016  . TONSILLECTOMY      There were no vitals filed for this visit.      Subjective Assessment - 02/12/17 1039    Subjective I'm feeling better this week from my last treatment. My back is a little better this week than it was last week. I'm also sleeping better this week.     Pertinent History Diagnosed with Hodgkin's lymphoma 09/21/16. Was living in [place] before that and had gotten sick in November, but was thought to have an infection or flu; large lymph node went away but a month later came back.  Was thought to have autoimmune disease.  Then insurance ran out.  Had a number of lumps at axillae and groin and was told she might have cancer. Biopsy showed cancer and she moved back here.  Started chemo soon after that and is still on chemo.  Was diagnosed at stage IV. Was in school in [place] and then online, bt that became too stressful.  Has chemo every two weeks; feels bad right away after chemo for a week+, then has 3-4 good days before the next chemo. Is at  round8 of 12 now. Had PET scan two weeks ago that showed 90% improvement so is still on chemo. Will have her next chemo 01/18/17.  Body has been achy this time.  No other health issues.  Had torn left ACL in 2010 and had surgery for that; recovered enough to go back to basketball.   Patient Stated Goals Feel a little better, more energy, less pain.   Currently in Pain? No/denies                         [facility] Adult PT Treatment/Exercise - 02/12/17 0001      Lumbar Exercises: Aerobic   Stationary Bike Level 2 x 10 mins   Elliptical On Quickstart x 5 mins, started having back pain so switched to bike     Knee/Hip Exercises: Machines for Strengthening   Cybex Knee Extension 20# 2x15   Cybex Knee Flexion 45# 2x15   Total Gym Leg Press 75#, 2x20     Shoulder Exercises: Standing   Other Standing Exercises Bil 3 way raises 2 lbs with back, shoulders and head against wall with core engagement for 2 x 10 (flexion, scaption, and abduction to shoulder height).     Shoulder Exercises: ROM/Strengthening   Cybex Press 15  reps  2 sets of 15; 35# for chest press   Cybex Row 20 reps  25# (Omega machine)   Other ROM/Strengthening Exercises seated lat pulldown on Omega machine, 35# x 20; standing tricep extension 15# x20                   Short Term Clinic Goals - 01/29/17 1042      CC Short Term Goal  #1   Title Pt. will report at least 50% decrease in pain in thoracic back at area between scapulae   Baseline 65% improvement reported at this time-01/29/17   Status Achieved     CC Short Term Goal  #2   Title Pt. will tolerate at least 15 minutes of nonstops mild to moderate exercise without significant shortness of breath or O2 saturation below 90%   Baseline Pt tolerated 15 mins of cardio (bike 10' and elliptical 5') with SpO2 being no less than 98% - 01/29/17   Status Achieved             Long Term Clinic Goals - 01/31/17 1101      CC Long Term Goal  #1    Title Pt. will report at least 75% decrease in pain at thoracic back at area between scapulae.   Baseline 75% improvement reported at this time-01/31/17   Status Achieved     CC Long Term Goal  #2   Title Pt. will be able to walk nonstop for 25 minutes at mild to moderate pace without needing to stop due to shortness of breath or leg pain.   Baseline 30 mins is limit now and that's just due to overall fatigue, not SOB-01/31/17   Status Achieved     CC Long Term Goal  #3   Title Pt. will be independent in a home exercise program for endurance and strengthening   Baseline Pt has been walking more and is consistent with HEP at this time which we are cont to progress as she tolerates.-01/31/17   Status On-going     CC Long Term Goal  #4   Title Pt. will be knowledgeable about community programs she can transition to for continuing exercise.   Baseline Have discussed [employer] and PREP progream with pt, she is still deciding-01/31/17   Status Partially Met            Plan - 02/12/17 1053    Clinical Impression Statement Pt feeling much better today than last week and was able to tolerate exerices very well. Only had to decrease time on ellitpical due to back pain which resolved once stopped and swithced to bike. She is wondering if she is getting enough fluids and if this isn't causing the muscular pain. At end of session SpO2 96% and HR 122 bpm.   Rehab Potential Good   Clinical Impairments Affecting Rehab Potential currently getting chemo every two weeks; will finish 03/18/17   PT Frequency 2x / week   PT Duration 8 weeks   PT Treatment/Interventions ADLs/Self Care Home Management;Electrical Stimulation;Moist Heat;Therapeutic exercise;Patient/family education;Manual techniques;Passive range of motion;Taping   PT Next Visit Plan Cont cardio and bil LE/UE strength   Consulted and Agree with Plan of Care Patient      Patient will benefit from skilled therapeutic intervention in order to  improve the following deficits and impairments:  Decreased endurance, Decreased activity tolerance, Pain, Postural dysfunction  Visit Diagnosis: Pain in thoracic spine  Other symptoms and signs involving the musculoskeletal system  Difficulty  in walking, not elsewhere classified     Problem List Patient Active Problem List   Diagnosis Date Noted  . Iron deficiency anemia due to chronic blood loss 10/12/2016  . Hodgkin's lymphoma ([place]) 10/10/2016    Otelia Limes, PTA 02/12/2017, 11:09 AM  [place], [place], [address] Phone: [phone]   Fax:  [phone]  Name: Makeisha Jentsch MRN: [id number] Date of Birth: 05/25/1991

## 2017-02-14 ENCOUNTER — Ambulatory Visit: Payer: Self-pay

## 2017-02-14 DIAGNOSIS — R29898 Other symptoms and signs involving the musculoskeletal system: Secondary | ICD-10-CM

## 2017-02-14 DIAGNOSIS — R262 Difficulty in walking, not elsewhere classified: Secondary | ICD-10-CM

## 2017-02-14 DIAGNOSIS — M546 Pain in thoracic spine: Secondary | ICD-10-CM
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
## 2017-02-15 ENCOUNTER — Other Ambulatory Visit (HOSPITAL_BASED_OUTPATIENT_CLINIC_OR_DEPARTMENT_OTHER): Payer: Self-pay

## 2017-02-15 ENCOUNTER — Ambulatory Visit (HOSPITAL_BASED_OUTPATIENT_CLINIC_OR_DEPARTMENT_OTHER): Payer: Self-pay | Admitting: Hematology

## 2017-02-15 ENCOUNTER — Ambulatory Visit (HOSPITAL_BASED_OUTPATIENT_CLINIC_OR_DEPARTMENT_OTHER): Payer: Self-pay

## 2017-02-15 VITALS — BP 145/86 | HR 94 | Temp 98.3°F | Resp 20 | Ht 61.0 in | Wt 174.9 lb

## 2017-02-15 DIAGNOSIS — C8198 Hodgkin lymphoma, unspecified, lymph nodes of multiple sites: Secondary | ICD-10-CM

## 2017-02-15 DIAGNOSIS — Z5112 Encounter for antineoplastic immunotherapy: Secondary | ICD-10-CM

## 2017-02-15 DIAGNOSIS — G620 Drug-induced polyneuropathy: Secondary | ICD-10-CM

## 2017-02-15 DIAGNOSIS — Z5189 Encounter for other specified aftercare: Secondary | ICD-10-CM

## 2017-02-15 DIAGNOSIS — C8170 Other classical Hodgkin lymphoma, unspecified site: Secondary | ICD-10-CM

## 2017-02-15 DIAGNOSIS — T451X5A Adverse effect of antineoplastic and immunosuppressive drugs, initial encounter: Secondary | ICD-10-CM

## 2017-02-15 DIAGNOSIS — Z5111 Encounter for antineoplastic chemotherapy: Secondary | ICD-10-CM

## 2017-02-15 DIAGNOSIS — R5383 Other fatigue: Secondary | ICD-10-CM

## 2017-02-15 LAB — CBC & DIFF AND RETIC
BASO%: 1.3 % (ref 0.0–2.0)
Basophils Absolute: 0.1 10*3/uL (ref 0.0–0.1)
EOS ABS: 0.5 10*3/uL (ref 0.0–0.5)
EOS%: 6.7 % (ref 0.0–7.0)
HEMATOCRIT: 36.1 % (ref 34.8–46.6)
HGB: 12 g/dL (ref 11.6–15.9)
Immature Retic Fract: 13.3 % — ABNORMAL HIGH (ref 1.60–10.00)
LYMPH#: 1.3 10*3/uL (ref 0.9–3.3)
LYMPH%: 17.3 % (ref 14.0–49.7)
MCH: 30.8 pg (ref 25.1–34.0)
MCHC: 33.2 g/dL (ref 31.5–36.0)
MCV: 92.8 fL (ref 79.5–101.0)
MONO#: 0.7 10*3/uL (ref 0.1–0.9)
MONO%: 9 % (ref 0.0–14.0)
NEUT%: 65.7 % (ref 38.4–76.8)
NEUTROS ABS: 5 10*3/uL (ref 1.5–6.5)
PLATELETS: 329 10*3/uL (ref 145–400)
RBC: 3.89 10*6/uL (ref 3.70–5.45)
RDW: 16.1 % — ABNORMAL HIGH (ref 11.2–14.5)
RETIC %: 6.07 % — AB (ref 0.70–2.10)
RETIC CT ABS: 236.12 10*3/uL — AB (ref 33.70–90.70)
WBC: 7.6 10*3/uL (ref 3.9–10.3)
nRBC: 0 % (ref 0–0)

## 2017-02-15 LAB — COMPREHENSIVE METABOLIC PANEL
ALT: 15 U/L (ref 0–55)
ANION GAP: 9 meq/L (ref 3–11)
AST: 22 U/L (ref 5–34)
Albumin: 3.4 g/dL — ABNORMAL LOW (ref 3.5–5.0)
Alkaline Phosphatase: 70 U/L (ref 40–150)
BUN: 2.9 mg/dL — AB (ref 7.0–26.0)
CHLORIDE: 105 meq/L (ref 98–109)
CO2: 26 meq/L (ref 22–29)
CREATININE: 0.7 mg/dL (ref 0.6–1.1)
Calcium: 9.6 mg/dL (ref 8.4–10.4)
EGFR: >90 mL/min/{1.73_m2} (ref >90)
Glucose: 107 mg/dl (ref 70–140)
Potassium: 3.7 mEq/L (ref 3.5–5.1)
Sodium: 140 mEq/L (ref 136–145)
TOTAL PROTEIN: 6.7 g/dL (ref 6.4–8.3)

## 2017-02-15 MED ORDER — SODIUM CHLORIDE 0.9 % IV SOLN
Freq: Once | INTRAVENOUS | Status: AC
  Administered 2017-02-15: 12:00:00 via INTRAVENOUS
  Filled 2017-02-15: qty 5

## 2017-02-15 MED ORDER — SODIUM CHLORIDE 0.9 % IV SOLN
1.2000 mg/kg | Freq: Once | INTRAVENOUS | Status: AC
  Administered 2017-02-15: 90 mg via INTRAVENOUS
  Filled 2017-02-15: qty 18

## 2017-02-15 MED ORDER — SODIUM CHLORIDE 0.9% FLUSH
10.0000 mL | INTRAVENOUS | Status: DC | PRN
Start: 2017-02-15 — End: 2017-02-15
  Administered 2017-02-15: 10 mL
  Filled 2017-02-15: qty 10

## 2017-02-15 MED ORDER — SODIUM CHLORIDE 0.9 % IV SOLN
375.0000 mg/m2 | Freq: Once | INTRAVENOUS | Status: AC
  Administered 2017-02-15: 660 mg via INTRAVENOUS
  Filled 2017-02-15: qty 33

## 2017-02-15 MED ORDER — HEPARIN SOD (PORK) LOCK FLUSH 100 UNIT/ML IV SOLN
500.0000 [IU] | Freq: Once | INTRAVENOUS | Status: AC | PRN
  Administered 2017-02-15: 500 [IU]
  Filled 2017-02-15: qty 5

## 2017-02-15 MED ORDER — PALONOSETRON HCL INJECTION 0.25 MG/5ML
0.2500 mg | Freq: Once | INTRAVENOUS | Status: AC
  Administered 2017-02-15: 0.25 mg via INTRAVENOUS

## 2017-02-15 MED ORDER — ALBUTEROL SULFATE (2.5 MG/3ML) 0.083% IN NEBU
2.5000 mg | INHALATION_SOLUTION | Freq: Once | RESPIRATORY_TRACT | Status: DC
  Filled 2017-02-15: qty 3

## 2017-02-15 MED ORDER — PALONOSETRON HCL INJECTION 0.25 MG/5ML
INTRAVENOUS | Status: AC
  Filled 2017-02-15: qty 5

## 2017-02-15 MED ORDER — DOXORUBICIN HCL CHEMO IV INJECTION 2 MG/ML
25.0000 mg/m2 | Freq: Once | INTRAVENOUS | Status: AC
  Administered 2017-02-15: 44 mg via INTRAVENOUS
  Filled 2017-02-15: qty 22

## 2017-02-15 MED ORDER — PEGFILGRASTIM 6 MG/0.6ML ~~LOC~~ PSKT
6.0000 mg | PREFILLED_SYRINGE | Freq: Once | SUBCUTANEOUS | Status: AC
  Administered 2017-02-15: 6 mg via SUBCUTANEOUS
  Filled 2017-02-15: qty 0.6

## 2017-02-15 MED ORDER — SODIUM CHLORIDE 0.9 % IV SOLN
Freq: Once | INTRAVENOUS | Status: AC
  Administered 2017-02-15: 11:00:00 via INTRAVENOUS

## 2017-02-15 MED ORDER — VINBLASTINE SULFATE CHEMO INJECTION 1 MG/ML
5.6000 mg/m2 | Freq: Once | INTRAVENOUS | Status: AC
  Administered 2017-02-15: 10 mg via INTRAVENOUS
  Filled 2017-02-15: qty 10

## 2017-02-15 NOTE — Patient Instructions (Signed)
[facility] Discharge Instructions for Patients Receiving Chemotherapy  Today you received the following chemotherapy agents: Adriamycin, Vinblastine, Dacarbazine, and Adcetris.   To help prevent nausea and vomiting after your treatment, we encourage you to take your nausea medication as directed.  If you develop nausea and vomiting that is not controlled by your nausea medication, call the clinic.   BELOW ARE SYMPTOMS THAT SHOULD BE REPORTED IMMEDIATELY:  *FEVER GREATER THAN 100.5 F  *CHILLS WITH OR WITHOUT FEVER  NAUSEA AND VOMITING THAT IS NOT CONTROLLED WITH YOUR NAUSEA MEDICATION  *UNUSUAL SHORTNESS OF BREATH  *UNUSUAL BRUISING OR BLEEDING  TENDERNESS IN MOUTH AND THROAT WITH OR WITHOUT PRESENCE OF ULCERS  *URINARY PROBLEMS  *BOWEL PROBLEMS  UNUSUAL RASH Items with * indicate a potential emergency and should be followed up as soon as possible.  Feel free to call the clinic you have any questions or concerns. The clinic phone number is [phone].  Please show the [facility] at check-in to the Emergency Department and triage nurse.

## 2017-02-15 NOTE — Progress Notes (Signed)
HEMATOLOGY/ONCOLOGY CLINIC NOTE  Date of Service: 02/15/17  Patient Care Team: Brunetta Genera, MD as PCP - General (Hematology) Patient, No Pcp Per (General Practice)  CHIEF COMPLAINTS/PURPOSE OF CONSULTATION:  F/u classical Hodgkin's lymphoma  HISTORY OF PRESENTING ILLNESS:   Leah Floyd is a wonderful 26 y.o. female who has been referred to [place] by [facility] for evaluation and management of newly diagnosed classical Hodgkin's lymphoma.  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  She notes that in November 2017 she developed generalized body aches and flulike symptoms with some subjective fevers chills and night sweats. A primary care physician noted a right axillary lump and she was treated empirically with Z-Pak for possible infection. She notes that around Christmas time in 2017 she developed worsening fatigue and generalized body aches a point that she was having difficulties getting through the day. She notes that she had an autoimmune workup HIV testing which was unrevealing she was noted to have some iron deficiency. She notes that she had a follow-up ultrasound that showed right axillary lymph node was enlarging and she also had a left axillary lymph node and had noted inguinal lymph nodes in the groin. Some of her workup was delayed due to insurance issues that she eventually had a left axillary lymph node biopsy on 09/24/2016 accession number [id number] which showed classical Hodgkin's lymphoma.  Patient then has moved back from [place] to be with her mother and family in [place] not, and is here to seek further cares for her classical Hodgkin's lymphoma newly diagnosed.  Patient notes significant fatigue, night sweats, weight loss of about 40 pounds in the  last 4-5 months, generalized body aches, dry cough and loss of appetite.  She notes that she previously had heavy menstrual periods lasting 7 days which are now become lighter and last about 4 days. She has been taking ferrous sulfate plus vitamin C for her iron deficiency anemia.  No previous heart problems. Has been having some mild diarrhea which she describes as soft stools 2-3 times a day.  INTERVAL HISTORY  Leah Floyd is here for a followup prior to C5 D15 of her chemotherapy. She is much better spirits and much more comfortable physically and emotionally today.   We slightly increased her dosage in Brentuximab back to full, this hasn't significantly worsened her neuropathy. Cymbalta was increased and she began outpatient cancer rehab also appears to have helped with this. Myalgia and fatigue improved and more manageable. These are still keeping her awake at night, but less frequently. She has had some night sweats as well. Her appetite has been improving. No overt fevers.   On review of systems, pt denies fever, chills, weight loss, decreased appetite. Denies pain. Pt denies abdominal pain, nausea, vomiting. She has had some constipation, but this has been under control with stool softner.   She did voice questions over whether she could travel to back to [place] in Nov to present her final review for her graduation.   MEDICAL HISTORY:   #1 iron deficiency anemia #2 GERD #3 obesity. #4 pseudotumor cerebri however this has resolved with her weight loss as per patient. #5 asthma #6 of newly diagnosed Hodgkin's lymphoma #7 shellfish allergy #8 history of Chlamydia infection   SURGICAL HISTORY: Past Surgical History:  Procedure Laterality Date  . ANTERIOR  CRUCIATE LIGAMENT REPAIR     left  . HERNIA REPAIR     left  . IR FLUORO GUIDE PORT INSERTION RIGHT  10/04/2016  . IR US GUIDE VASC ACCESS RIGHT  10/04/2016  . TONSILLECTOMY      SOCIAL HISTORY: Social History   Social  History  . Marital status: Single    Spouse name: N/A  . Number of children: N/A  . Years of education: N/A   Occupational History  . Not on file.   Social History Main Topics  . Smoking status: Never Smoker  . Smokeless tobacco: Never Used  . Alcohol use Yes     Comment: socially  . Drug use: Yes    Types: Marijuana     Comment: medical   . Sexual activity: Not on file   Other Topics Concern  . Not on file   Social History Narrative  . No narrative on file    FAMILY HISTORY: Notes her first cousin on her mom's side had some form off rare bone marrow cancer Does not know history on her father's side of the family Maternal grandmother-hypertension, diabetes, fibromyalgia Mother anemia Other relatives on her mom's side with lymphoma and bladder cancer.  ALLERGIES:  is allergic to shellfish-derived products.  MEDICATIONS:  Current Outpatient Prescriptions  Medication Sig Dispense Refill  . albuterol (PROVENTIL HFA;VENTOLIN HFA) 108 (90 Base) MCG/ACT inhaler Inhale 2 puffs into the lungs every 6 (six) hours as needed for wheezing or shortness of breath. 1 Inhaler 2  . Ascorbic Acid (VITAMIN C PO) Take by mouth.    . Cyanocobalamin (VITAMIN B-12 CR PO) Take 1 tablet by mouth daily.    [name] dexamethasone (DECADRON) 4 MG tablet Take 2 tablets by mouth once a day on the day after chemotherapy and then take 2 tablets two times a day for 2 days. Take with food. 30 tablet 1  . diphenhydramine-acetaminophen (TYLENOL PM) 25-500 MG TABS tablet Take 2 tablets by mouth at bedtime as needed.    . DULoxetine (CYMBALTA) 30 MG capsule Take 2 capsules (60 mg total) by mouth daily. 60 capsule 1  . EPINEPHrine 0.3 mg/0.3 mL IJ SOAJ injection Inject 0.3 mLs (0.3 mg total) into the muscle once. For anaphylactic reactions 1 Device 0  . HYDROcodone-acetaminophen (NORCO/VICODIN) 5-325 MG tablet Take 1-2 tablets by mouth every 6 (six) hours as needed for moderate pain or severe pain. 30 tablet 0  .  lidocaine-prilocaine (EMLA) cream Apply to affected area once 30 g 3  . loratadine (CLARITIN) 10 MG tablet Take 10 mg by mouth daily.    [name] LORazepam (ATIVAN) 0.5 MG tablet Take 1 tablet (0.5 mg total) by mouth every 6 (six) hours as needed (Nausea or vomiting). 30 tablet 0  . LORazepam (ATIVAN) 0.5 MG tablet Take 1 tablet (0.5 mg total) by mouth every 6 (six) hours as needed (Nausea or vomiting). 30 tablet 0  . magic mouthwash w/lidocaine SOLN Take 5 mLs by mouth 4 (four) times daily as needed for mouth pain. 120 mL 0  . ondansetron (ZOFRAN) 8 MG tablet Take 1 tablet (8 mg total) by mouth 2 (two) times daily as needed. Start on the third day after chemotherapy. 30 tablet 1  . pantoprazole (PROTONIX) 40 MG tablet Take 1 tablet (40 mg total) by mouth daily. 30 tablet 1  . PEGFILGRASTIM Campbell Apply 1 patch topically once.    . prochlorperazine (COMPAZINE) 10 MG tablet Take 1 tablet (10 mg total) by mouth every 6 (  six) hours as needed (Nausea or vomiting). 30 tablet 1  . senna-docusate (SENNA S) 8.6-50 MG tablet Take 2 tablets by mouth at bedtime. Hold if diarrhea ensues 60 tablet 1  . sucralfate (CARAFATE) 1 GM/10ML suspension Take 10 mLs (1 g total) by mouth 4 (four) times daily -  with meals and at bedtime. 420 mL 0   No current facility-administered medications for this visit.     REVIEW OF SYSTEMS:    10 Point review of Systems was done is negative except as noted above.  PHYSICAL EXAMINATION:  ECOG PERFORMANCE STATUS: 1 - Symptomatic but completely ambulatory  . Vitals:   02/15/17 1028  BP: (!) 145/86  Pulse: 94  Resp: 20  Temp: 98.3 F (36.8 C)  SpO2: 100%   Filed Weights   02/15/17 1028  Weight: 174 lb 14.4 oz (79.3 kg)   .Body mass index is 33.05 kg/m.  GENERAL:alert, in no acute distress and comfortable SKIN: no acute rashes, no significant lesions EYES: conjunctiva are pink and non-injected, sclera anicteric OROPHARYNX: MMM, no exudates, no oropharyngeal erythema or  ulceration NECK: supple, no JVD LYMPH: resolved lymphadenopathy previously present in bilaterally in her neck , occipital area, supraclavicular, bilateral axillary and inguinal areas  LUNGS: clear to auscultation b/l with normal respiratory effort HEART: regular rate & rhythm ABDOMEN:  normoactive bowel sounds , non tender, not distended. Borderline palpable splenomegaly, no palpable hepatomegaly. Extremity: no pedal edema PSYCH: alert & oriented x 3 with fluent speech NEURO: no focal motor/sensory deficits  LABORATORY DATA:  I have reviewed the data as listed  . CBC Latest Ref Rng & Units 02/15/2017 02/01/2017 01/18/2017  WBC 3.9 - 10.3 10e3/uL 7.6 10.9(H) 7.9  Hemoglobin 11.6 - 15.9 g/dL 12.0 11.7 12.3  Hematocrit 34.8 - 46.6 % 36.1 35.5 36.5  Platelets 145 - 400 10e3/uL 329 288 268   ANC 5k . CBC    Component Value Date/Time   WBC 7.6 02/15/2017 1011   WBC 9.1 12/31/2016 0300   RBC 3.89 02/15/2017 1011   RBC 3.97 12/31/2016 0300   HGB 12.0 02/15/2017 1011   HCT 36.1 02/15/2017 1011   PLT 329 02/15/2017 1011   MCV 92.8 02/15/2017 1011   MCH 30.8 02/15/2017 1011   MCH 30.2 12/31/2016 0300   MCHC 33.2 02/15/2017 1011   MCHC 35.2 12/31/2016 0300   RDW 16.1 (H) 02/15/2017 1011   LYMPHSABS 1.3 02/15/2017 1011   MONOABS 0.7 02/15/2017 1011   EOSABS 0.5 02/15/2017 1011   BASOSABS 0.1 02/15/2017 1011    . CMP Latest Ref Rng & Units 02/15/2017 02/01/2017 01/18/2017  Glucose 70 - 140 mg/dl 107 84 76  BUN 7.0 - 26.0 mg/dL 2.9(L) 2.7(L) 5.6(L)  Creatinine 0.6 - 1.1 mg/dL 0.7 0.7 0.7  Sodium 136 - 145 mEq/L 140 140 141  Potassium 3.5 - 5.1 mEq/L 3.7 3.6 3.8  Chloride 101 - 111 mmol/L - - -  CO2 22 - 29 mEq/L 26 29 27   Calcium 8.4 - 10.4 mg/dL 9.6 9.4 9.7  Total Protein 6.4 - 8.3 g/dL 6.7 6.5 6.7  Total Bilirubin 0.20 - 1.20 mg/dL <0.22 <0.22 <0.22  Alkaline Phos 40 - 150 U/L 70 69 74  AST 5 - 34 U/L 22 18 17   ALT 0 - 55 U/L 15 13 12    .  RADIOGRAPHIC STUDIES: I have  personally reviewed the radiological images as listed and agreed with the findings in the report. No results found.   NM PET Image Restag  Skull Base to Thigh, 01/02/2017 IMPRESSION: 1. Marked reduction in size and metabolic activity of axillary adenopathy, mediastinal adenopathy, and periaortic retroperitoneal adenopathy. Activity of these nodes is similar to mediastinal activity and less than liver activity ( Deauville 2). 2. Decreased in size and metabolic activity of bilateral iliac lymphadenopathy with enlarged lymph nodes remaining ( Deauville 2). 3. Most intense activity remains within LEFT inguinal lymph node with activity above liver activity Deauville 4. However, this lymph node was not metabolic on comparison examination and is normal size. Potential reactive node. 4. Increase in marrow activity is favored GCSF type response. 5. Mild metabolic activity associated ground-glass opacities in the upper lobes is favored infectious or inflammatory. Correlate with drug reaction / toxicity.   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  PET/CT after 3 cycles of treatment with excellent response to treatment thus far.  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  PLAN -labs are stable  -continue A-AVD with current full dose of Brentuximab as prescribed. Patient appropriate to proceed with C5D15 of treatment at this time. -vicodin prn for pain -continue on Cymbalta 60mg  po daily -continue working with rehab for strength and endurance and to help with fatigue. -discussed importance of maintaining good po food intake.   #4 Grade 1 oral  mucositis and oral thrush. Now resolved status post use of nystatin. Plan  - oral cryotherapy while getting chemotherapy - patient was counseled on this. - given refills for Magic mouthwash when necessary   #3 history of iron deficiency anemia Has been on oral iron that causes significant constipation and is still significantly anemic. S/p IV Injectafer 750mg  x 1 dose  hgb has improved. Ferritin adequate.  Lab Results  Component Value Date   FERRITIN 235 01/18/2017   plan  - No indication for additional iron replacement at this time  #4 history of asthma. This has been mild but tends to get worse in [place]. 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 Patient notes that she tolerates eating shrimp. -Prescribed EpiPen for when necessary use. -Recommended also to carry Benadryl.  #5 general medical cares -reminded again to get a PCP  -Advised that she would be able to travel in November if she is feeling up to it.    -continue treatment as per schedule -RTC with Dr Irene Limbo in 2weeks with C6D1 with labs  I spent 20 minutes counseling the patient face to face. The total time spent in the appointment was 25 minutes and more than 50% was on counseling and direct patient cares.    Sullivan Lone MD Dennis AAHIVMS [facility] Hematology/Oncology Physician [facility]  (Office):       [phone] (Work cell):  [phone] (Fax):           [phone]  This document serves as a record of services personally performed by Sullivan Lone, MD. It was created on his behalf by Reola Mosher, a trained medical scribe. The creation of this record is based on the scribe's personal observations and the provider's statements to them. This document has been checked and  approved by the attending provider.

## 2017-02-15 NOTE — Progress Notes (Signed)
Blood return noted before, ever 3 cc and after Adriamycin push. Blood return noted before and after Velban infusion.

## 2017-02-18 ENCOUNTER — Telehealth: Payer: Self-pay | Admitting: Hematology

## 2017-02-18 NOTE — Telephone Encounter (Signed)
Apts already scheduled per 9/14 los - no additional appts schedule.

## 2017-02-20 ENCOUNTER — Ambulatory Visit: Payer: Self-pay

## 2017-02-20 DIAGNOSIS — R262 Difficulty in walking, not elsewhere classified: Secondary | ICD-10-CM

## 2017-02-20 DIAGNOSIS — R29898 Other symptoms and signs involving the musculoskeletal system: Secondary | ICD-10-CM

## 2017-02-20 DIAGNOSIS — M546 Pain in thoracic spine: Secondary | ICD-10-CM

## 2017-02-20 NOTE — Therapy (Signed)
[place], [place], [address] Phone: [phone]   Fax:  [phone]  Physical Therapy Treatment  Patient Details  Name: Shaneal Barasch MRN: [id number] Date of Birth: September 23, 1990 Referring Provider: Dr. Sullivan Lone  Encounter Date: 02/20/2017      PT End of Session - 02/20/17 1119    Visit Number 9   Number of Visits 17   Date for PT Re-Evaluation 03/18/17   PT Start Time 1018   PT Stop Time 1103   PT Time Calculation (min) 45 min   Activity Tolerance Patient limited by fatigue   Behavior During Therapy [facility] for tasks assessed/performed      Past Medical History:  Diagnosis Date  . Cancer [facility])    lymphoma     Past Surgical History:  Procedure Laterality Date  . ANTERIOR CRUCIATE LIGAMENT REPAIR     left  . HERNIA REPAIR     left  . IR FLUORO GUIDE PORT INSERTION RIGHT  10/04/2016  . IR US GUIDE VASC ACCESS RIGHT  10/04/2016  . TONSILLECTOMY      There were no vitals filed for this visit.      Subjective Assessment - 02/20/17 1027    Subjective I am really feeling the chemo this week. I am hurting all over and so tired.    Pertinent History Diagnosed with Hodgkin's lymphoma 09/21/16. Was living in [place] before that and had gotten sick in November, but was thought to have an infection or flu; large lymph node went away but a month later came back.  Was thought to have autoimmune disease.  Then insurance ran out.  Had a number of lumps at axillae and groin and was told she might have cancer. Biopsy showed cancer and she moved back here.  Started chemo soon after that and is still on chemo.  Was diagnosed at stage IV. Was in school in [place] and then online, bt that became too stressful.  Has chemo every two weeks; feels bad right away after chemo for a week+, then has 3-4 good days before the next chemo. Is at round8 of 12 now. Had PET scan two weeks ago that showed 90% improvement so is  still on chemo. Will have her next chemo 01/18/17.  Body has been achy this time.  No other health issues.  Had torn left ACL in 2010 and had surgery for that; recovered enough to go back to basketball.   Patient Stated Goals Feel a little better, more energy, less pain.   Currently in Pain? Yes   Pain Score 7    Pain Location Leg   Pain Orientation Right;Left   Pain Descriptors / Indicators Sore;Sharp   Pain Radiating Towards Mostly in joints, also shouolders    Aggravating Factors  Neulast and recent chemo    Pain Relieving Factors just time                         [facility] Adult PT Treatment/Exercise - 02/20/17 0001      Lumbar Exercises: Aerobic   Stationary Bike Level 1, x 7 minutes  SpO2 98%, HR 128 bpm     Shoulder Exercises: Seated   Other Seated Exercises Bil 3 way raises with 1 lb,      Manual Therapy   Soft tissue mobilization In Supine: soft tissue work with prolonged pressure to upper trap near levator scap insertion with cervical P/ROM to stretch tightness at upper  traps. pt report some relieft, and trigger points very minimal today with releases noted during manual work.                   Short Term Clinic Goals - 01/29/17 1042      CC Short Term Goal  #1   Title Pt. will report at least 50% decrease in pain in thoracic back at area between scapulae   Baseline 65% improvement reported at this time-01/29/17   Status Achieved     CC Short Term Goal  #2   Title Pt. will tolerate at least 15 minutes of nonstops mild to moderate exercise without significant shortness of breath or O2 saturation below 90%   Baseline Pt tolerated 15 mins of cardio (bike 10' and elliptical 5') with SpO2 being no less than 98% - 01/29/17   Status Achieved             Long Term Clinic Goals - 01/31/17 1101      CC Long Term Goal  #1   Title Pt. will report at least 75% decrease in pain at thoracic back at area between scapulae.   Baseline 75% improvement  reported at this time-01/31/17   Status Achieved     CC Long Term Goal  #2   Title Pt. will be able to walk nonstop for 25 minutes at mild to moderate pace without needing to stop due to shortness of breath or leg pain.   Baseline 30 mins is limit now and that's just due to overall fatigue, not SOB-01/31/17   Status Achieved     CC Long Term Goal  #3   Title Pt. will be independent in a home exercise program for endurance and strengthening   Baseline Pt has been walking more and is consistent with HEP at this time which we are cont to progress as she tolerates.-01/31/17   Status On-going     CC Long Term Goal  #4   Title Pt. will be knowledgeable about community programs she can transition to for continuing exercise.   Baseline Have discussed [employer] and PREP progream with pt, she is still deciding-01/31/17   Status Partially Met            Plan - 02/20/17 1120    Clinical Impression Statement Pt came in with reports of increase joint pain and overall fatigue from her recent chemo treatment. She requested less exercise today and to include manual therapy as she was feeling very tight in her neck again along with the soreness/pain. Pt reported neck feeling better after session and she plans to increase her activity and exercises at home each day as she tolerates. Pt would like to continue therapy as she reports it has been very beneficial to her on her "good and bad weeks."   Rehab Potential Good   Clinical Impairments Affecting Rehab Potential currently getting chemo every two weeks; will finish 03/18/17   PT Frequency 2x / week   PT Duration 8 weeks   PT Treatment/Interventions ADLs/Self Care Home Management;Electrical Stimulation;Moist Heat;Therapeutic exercise;Patient/family education;Manual techniques;Passive range of motion;Taping   PT Next Visit Plan Cont cardio and bil LE/UE strength as pt can tolerate; manual therapy prn.    Consulted and Agree with Plan of Care Patient       Patient will benefit from skilled therapeutic intervention in order to improve the following deficits and impairments:  Decreased endurance, Decreased activity tolerance, Pain, Postural dysfunction  Visit Diagnosis: Pain in thoracic spine  Other symptoms and signs involving the musculoskeletal system  Difficulty in walking, not elsewhere classified     Problem List Patient Active Problem List   Diagnosis Date Noted  . Iron deficiency anemia due to chronic blood loss 10/12/2016  . Hodgkin's lymphoma ([place]) 10/10/2016    Otelia Limes, PTA 02/20/2017, 11:26 AM  [place], [place], [address] Phone: [phone]   Fax:  [phone]  Name: Mistey Hoffert MRN: [id number] Date of Birth: 1991-03-03

## 2017-02-25 ENCOUNTER — Telehealth: Payer: Self-pay

## 2017-02-25 NOTE — Telephone Encounter (Signed)
Pt called b/c dosage of chemo was changed. She has been in a lot of pain general aches and almost like needles in her joints over entire body. 10/10 when worst, today 6-7/10. The duloxetine is only working about 2-3 hours then she is back in pain again.  Her fingertips feel asleep and they hurt when touching things. She is using her claritin. She is using tylenol, Hot baths, Trying to get her body to relax.  She currently does not have any norco in her house. She is not functioning very well at these pain levels.

## 2017-02-26 ENCOUNTER — Telehealth: Payer: Self-pay

## 2017-02-26 ENCOUNTER — Ambulatory Visit: Payer: Self-pay

## 2017-02-26 DIAGNOSIS — R29898 Other symptoms and signs involving the musculoskeletal system: Secondary | ICD-10-CM

## 2017-02-26 DIAGNOSIS — R262 Difficulty in walking, not elsewhere classified: Secondary | ICD-10-CM

## 2017-02-26 DIAGNOSIS — M546 Pain in thoracic spine: Secondary | ICD-10-CM

## 2017-02-26 MED ORDER — HYDROCODONE-ACETAMINOPHEN 5-325 MG PO TABS: 1.0000 | ORAL_TABLET | Freq: Four times a day (QID) | ORAL | 0 refills | Status: DC | PRN

## 2017-02-26 MED FILL — HYDROCODON-APAP 5-325: 5-325 | 5 days supply | Qty: 30 | Fill #0

## 2017-02-26 NOTE — Therapy (Signed)
[place], [place], [address] Phone: [phone]   Fax:  [phone]  Physical Therapy Treatment  Patient Details  Name: Leah Floyd MRN: [id number] Date of Birth: [birth year] Referring Provider: Dr. Sullivan Lone  Encounter Date: 02/26/2017      PT End of Session - 02/26/17 1104    Visit Number 10   Number of Visits 17   Date for PT Re-Evaluation 03/18/17   PT Start Time 9449   PT Stop Time 1103   PT Time Calculation (min) 40 min   Activity Tolerance Patient limited by fatigue   Behavior During Therapy [facility] for tasks assessed/performed      Past Medical History:  Diagnosis Date  . Cancer [facility])    lymphoma     Past Surgical History:  Procedure Laterality Date  . ANTERIOR CRUCIATE LIGAMENT REPAIR     left  . HERNIA REPAIR     left  . IR FLUORO GUIDE PORT INSERTION RIGHT  10/04/2016  . IR US GUIDE VASC ACCESS RIGHT  10/04/2016  . TONSILLECTOMY      There were no vitals filed for this visit.      Subjective Assessment - 02/26/17 1025    Subjective I got worse after I was here last, not from what we did, just from my treatment, and then I got some better. 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 I'm still just hurting everywhere again. I called the doctor yesterday for pain but I haven;t gotten a call back yet. I want to try some cardio today and maybe some stretching.    Pertinent History Diagnosed with Hodgkin's lymphoma 09/21/16. Was living in [place] before that and had gotten sick in November, but was thought to have an infection or flu; large lymph node went away but a month later came back.  Was thought to have autoimmune disease.  Then insurance ran out.  Had a number of lumps at axillae and groin and was told she might have cancer. Biopsy showed cancer and she moved back here.  Started chemo soon after that and is still on chemo.  Was diagnosed at stage IV. Was in school in  [place] and then online, bt that became too stressful.  Has chemo every two weeks; feels bad right away after chemo for a week+, then has 3-4 good days before the next chemo. Is at round8 of 12 now. Had PET scan two weeks ago that showed 90% improvement so is still on chemo. Will have her next chemo 01/18/17.  Body has been achy this time.  No other health issues.  Had torn left ACL in 2010 and had surgery for that; recovered enough to go back to basketball.   Patient Stated Goals Feel a little better, more energy, less pain.   Currently in Pain? Yes   Pain Score 5    Pain Orientation Right;Left   Pain Descriptors / Indicators Aching   Aggravating Factors  Neulasta and recent chemo   Pain Relieving Factors time, tried calling doctor for pain meds yesterday but haven't heard back yet                         [facility] Adult PT Treatment/Exercise - 02/26/17 0001      Lumbar Exercises: Stretches   Lower Trunk Rotation 3 reps;20 seconds     Lumbar Exercises: Aerobic   Stationary Bike Level 1, x 6 minutes   SpO2  99%, HR 122 bpm after     Knee/Hip Exercises: Stretches   Passive Hamstring Stretch Right;Left;3 reps;20 seconds   Hip Flexor Stretch Right;Left;3 reps;10 seconds  With foot in chair behind pt   ITB Stretch Right;Left;2 reps;10 seconds  On foam roll   Piriformis Stretch Right;Left;3 reps;20 seconds   Gastroc Stretch Right;Left;3 reps;30 seconds   Other Knee/Hip Stretches Lunge pose with arms overhead 3 times each leg with 10 sec holds     Shoulder Exercises: ROM/Strengthening   Other ROM/Strengthening Exercises Modified Downward Dog on wall 3 times, 10 sec holds.     Shoulder Exercises: Stretch   Corner Stretch 3 reps;20 seconds  In doorway                   Short Term Clinic Goals - 01/29/17 1042      CC Short Term Goal  #1   Title Pt. will report at least 50% decrease in pain in thoracic back at area between scapulae   Baseline 65% improvement  reported at this time-01/29/17   Status Achieved     CC Short Term Goal  #2   Title Pt. will tolerate at least 15 minutes of nonstops mild to moderate exercise without significant shortness of breath or O2 saturation below 90%   Baseline Pt tolerated 15 mins of cardio (bike 10' and elliptical 5') with SpO2 being no less than 98% - 01/29/17   Status Achieved             Long Term Clinic Goals - 01/31/17 1101      CC Long Term Goal  #1   Title Pt. will report at least 75% decrease in pain at thoracic back at area between scapulae.   Baseline 75% improvement reported at this time-01/31/17   Status Achieved     CC Long Term Goal  #2   Title Pt. will be able to walk nonstop for 25 minutes at mild to moderate pace without needing to stop due to shortness of breath or leg pain.   Baseline 30 mins is limit now and that's just due to overall fatigue, not SOB-01/31/17   Status Achieved     CC Long Term Goal  #3   Title Pt. will be independent in a home exercise program for endurance and strengthening   Baseline Pt has been walking more and is consistent with HEP at this time which we are cont to progress as she tolerates.-01/31/17   Status On-going     CC Long Term Goal  #4   Title Pt. will be knowledgeable about community programs she can transition to for continuing exercise.   Baseline Have discussed [employer] and PREP progream with pt, she is still deciding-01/31/17   Status Partially Met            Plan - 02/26/17 1104    Clinical Impression Statement Pt only able to tolerate 6 minutes on bike today and wanted to focus on streching tight muscles as she has not felt well at all with this recent chemo and Neulasta. Pt did report feeling better at end of session today.    Rehab Potential Good   Clinical Impairments Affecting Rehab Potential currently getting chemo every two weeks; will finish 03/18/17   PT Frequency 2x / week   PT Duration 8 weeks   PT Treatment/Interventions  ADLs/Self Care Home Management;Electrical Stimulation;Moist Heat;Therapeutic exercise;Patient/family education;Manual techniques;Passive range of motion;Taping   PT Next Visit Plan Assess goals next. Cont cardio  and bil LE/UE strength as pt can tolerate; manual therapy prn.    Consulted and Agree with Plan of Care Patient      Patient will benefit from skilled therapeutic intervention in order to improve the following deficits and impairments:  Decreased endurance, Decreased activity tolerance, Pain, Postural dysfunction  Visit Diagnosis: Pain in thoracic spine  Other symptoms and signs involving the musculoskeletal system  Difficulty in walking, not elsewhere classified     Problem List Patient Active Problem List   Diagnosis Date Noted  . Iron deficiency anemia due to chronic blood loss 10/12/2016  . Hodgkin's lymphoma ([place]) 10/10/2016    Otelia Limes, PTA 02/26/2017, 11:06 AM  [place], [place], [address] Phone: [phone]   Fax:  [phone]  Name: Leah Floyd MRN: [id number] Date of Birth: May 21, 1991

## 2017-02-26 NOTE — Telephone Encounter (Signed)
Pt did not answer phone. Spoke with mother. Told her that we would refill her hydrocodone. Pt can pick up prescription from [facility] today before 4:30pm. Visit with pt on 03/01/17. Will discuss side effects and potential treatment change with Dr. Irene Limbo at that time.

## 2017-02-28 ENCOUNTER — Ambulatory Visit: Payer: Self-pay

## 2017-02-28 DIAGNOSIS — R29898 Other symptoms and signs involving the musculoskeletal system: Secondary | ICD-10-CM

## 2017-02-28 DIAGNOSIS — M546 Pain in thoracic spine: Secondary | ICD-10-CM

## 2017-02-28 DIAGNOSIS — R262 Difficulty in walking, not elsewhere classified: Secondary | ICD-10-CM

## 2017-02-28 NOTE — Therapy (Signed)
[place], [place], [address] Phone: [phone]   Fax:  [phone]  Physical Therapy Treatment  Patient Details  Name: Leah Floyd MRN: [id number] Date of Birth: [birth year] Referring Provider: Dr. Sullivan Lone  Encounter Date: 02/28/2017      PT End of Session - 02/28/17 1132    Visit Number 11   Number of Visits 17   Date for PT Re-Evaluation 03/18/17   PT Start Time 1107  Pt running late today   PT Stop Time 1148   PT Time Calculation (min) 41 min   Activity Tolerance Patient tolerated treatment well   Behavior During Therapy [facility] for tasks assessed/performed      Past Medical History:  Diagnosis Date  . Cancer [facility])    lymphoma     Past Surgical History:  Procedure Laterality Date  . ANTERIOR CRUCIATE LIGAMENT REPAIR     left  . HERNIA REPAIR     left  . IR FLUORO GUIDE PORT INSERTION RIGHT  10/04/2016  . IR US GUIDE VASC ACCESS RIGHT  10/04/2016  . TONSILLECTOMY      There were no vitals filed for this visit.      Subjective Assessment - 02/28/17 1116    Subjective My doctor called in a prescription for me for pain after I was here last and that has really helped! I'm feeling a little better today but know it's all going away tomorrow after I have chemo again. Only 2 left though! My back is the only thing bothering e today, just a little sore though. The stretching really helped last time.    Pertinent History Diagnosed with Hodgkin's lymphoma 09/21/16. Was living in [place] before that and had gotten sick in November, but was thought to have an infection or flu; large lymph node went away but a month later came back.  Was thought to have autoimmune disease.  Then insurance ran out.  Had a number of lumps at axillae and groin and was told she might have cancer. Biopsy showed cancer and she moved back here.  Started chemo soon after that and is still on chemo.  Was diagnosed at stage  IV. Was in school in [place] and then online, bt that became too stressful.  Has chemo every two weeks; feels bad right away after chemo for a week+, then has 3-4 good days before the next chemo. Is at round8 of 12 now. Had PET scan two weeks ago that showed 90% improvement so is still on chemo. Will have her next chemo 01/18/17.  Body has been achy this time.  No other health issues.  Had torn left ACL in 2010 and had surgery for that; recovered enough to go back to basketball.   Patient Stated Goals Feel a little better, more energy, less pain.   Currently in Pain? No/denies                         [facility] Adult PT Treatment/Exercise - 02/28/17 0001      Lumbar Exercises: Stretches   Single Knee to Chest Stretch 2 reps;20 seconds   Double Knee to Chest Stretch 2 reps;20 seconds   Lower Trunk Rotation 2 reps;20 seconds   Prone on Elbows Stretch 2 reps;30 seconds     Lumbar Exercises: Aerobic   Elliptical Level 1, incline 8 x 10 mins  Elliptical on ortho side     Knee/Hip Exercises: Stretches   Passive  Hamstring Stretch Right;Left;2 reps;30 seconds   Piriformis Stretch Right;Left;2 reps;30 seconds     Knee/Hip Exercises: Machines for Strengthening   Cybex Knee Extension 20# 2 x 15   Cybex Leg Press 45# 2 x 10   Total Gym Leg Press 45#, 2x15      Shoulder Exercises: ROM/Strengthening   Cybex Press 10 reps  35#, 2 x 10   Cybex Row 15 reps  25#                   Short Term Clinic Goals - 01/29/17 1042      CC Short Term Goal  #1   Title Pt. will report at least 50% decrease in pain in thoracic back at area between scapulae   Baseline 65% improvement reported at this time-01/29/17   Status Achieved     CC Short Term Goal  #2   Title Pt. will tolerate at least 15 minutes of nonstops mild to moderate exercise without significant shortness of breath or O2 saturation below 90%   Baseline Pt tolerated 15 mins of cardio (bike 10' and elliptical 5') with  SpO2 being no less than 98% - 01/29/17   Status Achieved             Long Term Clinic Goals - 02/28/17 1138      CC Long Term Goal  #1   Title Pt. will report at least 75% decrease in pain at thoracic back at area between scapulae.   Baseline 75% improvement reported at this time-01/31/17   Status Achieved     CC Long Term Goal  #2   Title Pt. will be able to walk nonstop for 25 minutes at mild to moderate pace without needing to stop due to shortness of breath or leg pain.   Baseline 30 mins is limit now and that's just due to overall fatigue, not SOB-01/31/17   Status Achieved     CC Long Term Goal  #3   Title Pt. will be independent in a home exercise program for endurance and strengthening   Baseline Pt has been walking more and is consistent with HEP at this time which we are cont to progress as she tolerates.-01/31/17; pt is independent with HEP at this time but reports weight training/strengthening has been very beneficial for her throughout chemo when she can tolerate it as she does not have access to a gym at this time (plans to move back to [place] once last 2 chemo treaments are over) and she still needs to be closely monitored due to current treatment-02/28/17   Status On-going     CC Long Term Goal  #4   Title Pt. will be knowledgeable about community programs she can transition to for continuing exercise.   Baseline Have discussed [employer] and PREP progream with pt, she is still deciding-01/31/17; pt plans to move back to [place] after chemo is finished next month and will look into programs there-02/28/17   Status Achieved            Plan - 02/28/17 1135    Clinical Impression Statement Pt able to tolerate 10 mins of cardio today and weights though we did decrease most weights and reps to be tolerable for pt today. She has another chemo tomorrow so will have treatments as able next week.    Rehab Potential Good   Clinical Impairments Affecting Rehab Potential currently  getting chemo every two weeks; will finish 03/18/17   PT Frequency 2x / week  PT Duration 8 weeks   PT Treatment/Interventions ADLs/Self Care Home Management;Electrical Stimulation;Moist Heat;Therapeutic exercise;Patient/family education;Manual techniques;Passive range of motion;Taping   PT Next Visit Plan Cont cardio and bil LE/UE strength as pt can tolerate; manual therapy prn.    Consulted and Agree with Plan of Care Patient      Patient will benefit from skilled therapeutic intervention in order to improve the following deficits and impairments:  Decreased endurance, Decreased activity tolerance, Pain, Postural dysfunction  Visit Diagnosis: Pain in thoracic spine  Other symptoms and signs involving the musculoskeletal system  Difficulty in walking, not elsewhere classified     Problem List Patient Active Problem List   Diagnosis Date Noted  . Iron deficiency anemia due to chronic blood loss 10/12/2016  . Hodgkin's lymphoma ([place]) 10/10/2016    Otelia Limes, PTA 02/28/2017, 12:07 PM  [place], [place], [address] Phone: [phone]   Fax:  [phone]  Name: Khiana Camino MRN: [id number] Date of Birth: August 12, 1990

## 2017-03-01 ENCOUNTER — Ambulatory Visit (HOSPITAL_BASED_OUTPATIENT_CLINIC_OR_DEPARTMENT_OTHER): Payer: Self-pay | Admitting: Hematology

## 2017-03-01 ENCOUNTER — Ambulatory Visit (HOSPITAL_BASED_OUTPATIENT_CLINIC_OR_DEPARTMENT_OTHER): Payer: Self-pay

## 2017-03-01 ENCOUNTER — Other Ambulatory Visit (HOSPITAL_BASED_OUTPATIENT_CLINIC_OR_DEPARTMENT_OTHER): Payer: Self-pay

## 2017-03-01 ENCOUNTER — Telehealth: Payer: Self-pay

## 2017-03-01 ENCOUNTER — Other Ambulatory Visit: Payer: Self-pay | Admitting: Hematology

## 2017-03-01 ENCOUNTER — Telehealth: Payer: Self-pay | Admitting: Hematology

## 2017-03-01 VITALS — BP 144/97 | HR 70 | Temp 97.8°F | Resp 17 | Ht 61.0 in | Wt 169.5 lb

## 2017-03-01 DIAGNOSIS — T80810A Extravasation of vesicant antineoplastic chemotherapy, initial encounter: Secondary | ICD-10-CM

## 2017-03-01 DIAGNOSIS — C8198 Hodgkin lymphoma, unspecified, lymph nodes of multiple sites: Secondary | ICD-10-CM

## 2017-03-01 DIAGNOSIS — R5383 Other fatigue: Secondary | ICD-10-CM

## 2017-03-01 DIAGNOSIS — D509 Iron deficiency anemia, unspecified: Secondary | ICD-10-CM

## 2017-03-01 DIAGNOSIS — G620 Drug-induced polyneuropathy: Secondary | ICD-10-CM

## 2017-03-01 DIAGNOSIS — Z5111 Encounter for antineoplastic chemotherapy: Secondary | ICD-10-CM

## 2017-03-01 DIAGNOSIS — T451X5A Adverse effect of antineoplastic and immunosuppressive drugs, initial encounter: Secondary | ICD-10-CM

## 2017-03-01 LAB — COMPREHENSIVE METABOLIC PANEL
ALBUMIN: 3.7 g/dL (ref 3.5–5.0)
ALT: 12 U/L (ref 0–55)
AST: 20 U/L (ref 5–34)
Alkaline Phosphatase: 76 U/L (ref 40–150)
Anion Gap: 8 mEq/L (ref 3–11)
BILIRUBIN TOTAL: 0.22 mg/dL (ref 0.20–1.20)
BUN: 3.6 mg/dL — AB (ref 7.0–26.0)
CHLORIDE: 104 meq/L (ref 98–109)
CO2: 28 meq/L (ref 22–29)
CREATININE: 0.7 mg/dL (ref 0.6–1.1)
Calcium: 9.5 mg/dL (ref 8.4–10.4)
EGFR: >90 mL/min/{1.73_m2} (ref >90)
GLUCOSE: 92 mg/dL (ref 70–140)
Potassium: 3.7 mEq/L (ref 3.5–5.1)
SODIUM: 140 meq/L (ref 136–145)
TOTAL PROTEIN: 6.9 g/dL (ref 6.4–8.3)

## 2017-03-01 LAB — CBC & DIFF AND RETIC
BASO%: 1.7 % (ref 0.0–2.0)
BASOS ABS: 0.1 10*3/uL (ref 0.0–0.1)
EOS ABS: 0.5 10*3/uL (ref 0.0–0.5)
EOS%: 7.2 % — ABNORMAL HIGH (ref 0.0–7.0)
HCT: 35.4 % (ref 34.8–46.6)
HEMOGLOBIN: 11.7 g/dL (ref 11.6–15.9)
IMMATURE RETIC FRACT: 15.5 % — AB (ref 1.60–10.00)
LYMPH%: 19.1 % (ref 14.0–49.7)
MCH: 30.8 pg (ref 25.1–34.0)
MCHC: 33.1 g/dL (ref 31.5–36.0)
MCV: 93.2 fL (ref 79.5–101.0)
MONO#: 0.9 10*3/uL (ref 0.1–0.9)
MONO%: 14.4 % — ABNORMAL HIGH (ref 0.0–14.0)
NEUT%: 57.6 % (ref 38.4–76.8)
NEUTROS ABS: 3.8 10*3/uL (ref 1.5–6.5)
Platelets: 376 10*3/uL (ref 145–400)
RBC: 3.8 10*6/uL (ref 3.70–5.45)
RDW: 15.4 % — AB (ref 11.2–14.5)
RETIC %: 6.56 % — AB (ref 0.70–2.10)
RETIC CT ABS: 249.28 10*3/uL — AB (ref 33.70–90.70)
WBC: 6.5 10*3/uL (ref 3.9–10.3)
lymph#: 1.3 10*3/uL (ref 0.9–3.3)

## 2017-03-01 MED ORDER — VINBLASTINE SULFATE CHEMO INJECTION 1 MG/ML
5.6000 mg/m2 | Freq: Once | INTRAVENOUS | Status: DC
  Filled 2017-03-01: qty 10

## 2017-03-01 MED ORDER — SODIUM CHLORIDE 0.9 % IV SOLN
0.9000 mg/kg | Freq: Once | INTRAVENOUS | Status: DC
  Filled 2017-03-01: qty 13

## 2017-03-01 MED ORDER — PALONOSETRON HCL INJECTION 0.25 MG/5ML
0.2500 mg | Freq: Once | INTRAVENOUS | Status: AC
  Administered 2017-03-01: 0.25 mg via INTRAVENOUS

## 2017-03-01 MED ORDER — DOXORUBICIN HCL CHEMO IV INJECTION 2 MG/ML
25.0000 mg/m2 | Freq: Once | INTRAVENOUS | Status: AC
  Administered 2017-03-01: 44 mg via INTRAVENOUS
  Filled 2017-03-01: qty 22

## 2017-03-01 MED ORDER — HEPARIN SOD (PORK) LOCK FLUSH 100 UNIT/ML IV SOLN
500.0000 [IU] | Freq: Once | INTRAVENOUS | Status: DC | PRN
  Filled 2017-03-01: qty 5

## 2017-03-01 MED ORDER — ALBUTEROL SULFATE (2.5 MG/3ML) 0.083% IN NEBU
2.5000 mg | INHALATION_SOLUTION | Freq: Once | RESPIRATORY_TRACT | Status: DC
  Filled 2017-03-01: qty 3

## 2017-03-01 MED ORDER — SODIUM CHLORIDE 0.9 % IV SOLN
Freq: Once | INTRAVENOUS | Status: AC
  Administered 2017-03-01: 14:00:00 via INTRAVENOUS
  Filled 2017-03-01: qty 5

## 2017-03-01 MED ORDER — COLD PACK MISC ONCOLOGY
1.0000 | Freq: Once | Status: AC | PRN
  Administered 2017-03-01: 1 via TOPICAL
  Filled 2017-03-01: qty 1

## 2017-03-01 MED ORDER — SODIUM CHLORIDE 0.9 % IV SOLN
375.0000 mg/m2 | Freq: Once | INTRAVENOUS | Status: DC
  Filled 2017-03-01: qty 33

## 2017-03-01 MED ORDER — PEGFILGRASTIM 6 MG/0.6ML ~~LOC~~ PSKT
6.0000 mg | PREFILLED_SYRINGE | Freq: Once | SUBCUTANEOUS | Status: DC
  Filled 2017-03-01: qty 0.6

## 2017-03-01 MED ORDER — SODIUM CHLORIDE 0.9% FLUSH
10.0000 mL | INTRAVENOUS | Status: DC | PRN
  Filled 2017-03-01: qty 10

## 2017-03-01 MED ORDER — SODIUM CHLORIDE 0.9 % IV SOLN
Freq: Once | INTRAVENOUS | Status: AC
  Administered 2017-03-01: 14:00:00 via INTRAVENOUS

## 2017-03-01 MED ORDER — PALONOSETRON HCL INJECTION 0.25 MG/5ML
INTRAVENOUS | Status: AC
  Filled 2017-03-01: qty 5

## 2017-03-01 NOTE — Progress Notes (Signed)
HEMATOLOGY/ONCOLOGY CLINIC NOTE  Date of Service: 03/01/17  Patient Care Team: Brunetta Genera, MD as PCP - General (Hematology) Patient, No Pcp Per (General Practice)  CHIEF COMPLAINTS/PURPOSE OF CONSULTATION:  F/u classical Hodgkin's lymphoma  HISTORY OF PRESENTING ILLNESS:   Leah Floyd is a wonderful 26 y.o. female who has been referred to [place] by [employer] [facility] for evaluation and management of newly diagnosed classical Hodgkin's lymphoma.  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  She notes that in November 2017 she developed generalized body aches and flulike symptoms with some subjective fevers chills and night sweats. A primary care physician noted a right axillary lump and she was treated empirically with Z-Pak for possible infection. She notes that around Christmas time in 2017 she developed worsening fatigue and generalized body aches a point that she was having difficulties getting through the day. She notes that she had an autoimmune workup HIV testing which was unrevealing she was noted to have some iron deficiency. She notes that she had a follow-up ultrasound that showed right axillary lymph node was enlarging and she also had a left axillary lymph node and had noted inguinal lymph nodes in the groin. Some of her workup was delayed due to insurance issues that she eventually had a left axillary lymph node biopsy on 09/24/2016 accession number [id number] which showed classical Hodgkin's lymphoma.  Patient then has moved back from [place] to be with her mother and family in [place] not, and is here to seek further cares for her classical Hodgkin's lymphoma newly diagnosed.  Patient notes significant fatigue, night sweats, weight loss of about 40 pounds in the  last 4-5 months, generalized body aches, dry cough and loss of appetite.  She notes that she previously had heavy menstrual periods lasting 7 days which are now become lighter and last about 4 days. She has been taking ferrous sulfate plus vitamin C for her iron deficiency anemia.  No previous heart problems. Has been having some mild diarrhea which she describes as soft stools 2-3 times a day.  INTERVAL HISTORY  Leah Floyd is here for a followup prior to C6 D1 of her chemotherapy. She is much better spirits and much more comfortable physically and emotionally today.   She did call us about having some increased neuropathy and myalgias in the interim. We prescribed more Norco and this aided with this. She has been taking this once a day typically. She usually takes her Cymbalta in the morning which controls her pain into the afternoon and she will take her Norco into the evening which helps her sleep throughout the night. She did have some sharp arthralgic pains as well, but her counts have improved. She also reports that her neuropathy is mainly only present in the hands and her toes have not really been bothersome. She is still in outpatient rehab and she reports that she has been tolerating this well and helping her throughout her treatments. She notes that she has had some pruritis, but no overt skin rashes. She does note some shortness of breath while speaking and she will intermittently have to take breaks to help catch her breath; however, she is somewhat anxious regarding her classes, school, and finding a career. May be related to anxiety. No chest pains. She has had some fatigue, chills and night sweats,  but these have been manageable. She denies any new or enlarged nodes.   On review of systems, pt denies fever, weight loss, decreased appetite, night sweats. Denies pain. Pt denies abdominal pain, nausea, vomiting. She has had some constipation, but this has been under control with stool  softner.   MEDICAL HISTORY:   #1 iron deficiency anemia #2 GERD #3 obesity. #4 pseudotumor cerebri however this has resolved with her weight loss as per patient. #5 asthma #6 of newly diagnosed Hodgkin's lymphoma #7 shellfish allergy #8 history of Chlamydia infection   SURGICAL HISTORY: Past Surgical History:  Procedure Laterality Date  . ANTERIOR CRUCIATE LIGAMENT REPAIR     left  . HERNIA REPAIR     left  . IR FLUORO GUIDE PORT INSERTION RIGHT  10/04/2016  . IR US GUIDE VASC ACCESS RIGHT  10/04/2016  . TONSILLECTOMY      SOCIAL HISTORY: Social History   Social History  . Marital status: Single    Spouse name: N/A  . Number of children: N/A  . Years of education: N/A   Occupational History  . Not on file.   Social History Main Topics  . Smoking status: Never Smoker  . Smokeless tobacco: Never Used  . Alcohol use Yes     Comment: socially  . Drug use: Yes    Types: Marijuana     Comment: medical   . Sexual activity: Not on file   Other Topics Concern  . Not on file   Social History Narrative  . No narrative on file    FAMILY HISTORY: Notes her first cousin on her mom's side had some form off rare bone marrow cancer Does not know history on her father's side of the family Maternal grandmother-hypertension, diabetes, fibromyalgia Mother anemia Other relatives on her mom's side with lymphoma and bladder cancer.  ALLERGIES:  is allergic to shellfish-derived products.  MEDICATIONS:  Current Outpatient Prescriptions  Medication Sig Dispense Refill  . albuterol (PROVENTIL HFA;VENTOLIN HFA) 108 (90 Base) MCG/ACT inhaler Inhale 2 puffs into the lungs every 6 (six) hours as needed for wheezing or shortness of breath. 1 Inhaler 2  . Ascorbic Acid (VITAMIN C PO) Take by mouth.    . Cyanocobalamin (VITAMIN B-12 CR PO) Take 1 tablet by mouth daily.    [name] dexamethasone (DECADRON) 4 MG tablet Take 2 tablets by mouth once a day on the day after chemotherapy and then  take 2 tablets two times a day for 2 days. Take with food. 30 tablet 1  . diphenhydramine-acetaminophen (TYLENOL PM) 25-500 MG TABS tablet Take 2 tablets by mouth at bedtime as needed.    . DULoxetine (CYMBALTA) 30 MG capsule Take 2 capsules (60 mg total) by mouth daily. 60 capsule 1  . EPINEPHrine 0.3 mg/0.3 mL IJ SOAJ injection Inject 0.3 mLs (0.3 mg total) into the muscle once. For anaphylactic reactions 1 Device 0  . HYDROcodone-acetaminophen (NORCO/VICODIN) 5-325 MG tablet Take 1-2 tablets by mouth every 6 (six) hours as needed for moderate pain or severe pain. 30 tablet 0  . lidocaine-prilocaine (EMLA) cream Apply to affected area once 30 g 3  . loratadine (CLARITIN) 10 MG tablet Take 10 mg by mouth daily.    [name] LORazepam (ATIVAN) 0.5 MG tablet Take 1 tablet (0.5 mg total) by mouth every 6 (six) hours as needed (Nausea or vomiting). 30 tablet 0  . LORazepam (ATIVAN) 0.5 MG tablet Take 1 tablet (0.5 mg total) by mouth every 6 (six) hours as needed (  Nausea or vomiting). 30 tablet 0  . magic mouthwash w/lidocaine SOLN Take 5 mLs by mouth 4 (four) times daily as needed for mouth pain. 120 mL 0  . ondansetron (ZOFRAN) 8 MG tablet Take 1 tablet (8 mg total) by mouth 2 (two) times daily as needed. Start on the third day after chemotherapy. 30 tablet 1  . pantoprazole (PROTONIX) 40 MG tablet Take 1 tablet (40 mg total) by mouth daily. 30 tablet 1  . PEGFILGRASTIM [place] Apply 1 patch topically once.    . prochlorperazine (COMPAZINE) 10 MG tablet Take 1 tablet (10 mg total) by mouth every 6 (six) hours as needed (Nausea or vomiting). 30 tablet 1  . senna-docusate (SENNA S) 8.6-50 MG tablet Take 2 tablets by mouth at bedtime. Hold if diarrhea ensues 60 tablet 1  . sucralfate (CARAFATE) 1 GM/10ML suspension Take 10 mLs (1 g total) by mouth 4 (four) times daily -  with meals and at bedtime. 420 mL 0   No current facility-administered medications for this visit.    Facility-Administered Medications Ordered in  Other Visits  Medication Dose Route Frequency Provider Last Rate Last Dose  . albuterol (PROVENTIL) (2.5 MG/3ML) 0.083% nebulizer solution 2.5 mg  2.5 mg Nebulization Once Brunetta Genera, MD      . brentuximab vedotin (ADCETRIS) 65 mg in sodium chloride 0.9 % 100 mL chemo infusion  0.9 mg/kg (Treatment Plan Recorded) Intravenous Once Brunetta Genera, MD      . dacarbazine (DTIC) 660 mg in sodium chloride 0.9 % 250 mL chemo infusion  375 mg/m2 (Treatment Plan Recorded) Intravenous Once Brunetta Genera, MD      . heparin lock flush 100 unit/mL  500 Units Intracatheter Once PRN Brunetta Genera, MD      . pegfilgrastim (NEULASTA ONPRO KIT) injection 6 mg  6 mg Subcutaneous Once Brunetta Genera, MD      . sodium chloride flush (NS) 0.9 % injection 10 mL  10 mL Intracatheter PRN Brunetta Genera, MD      . vinBLAStine (VELBAN) 10 mg in sodium chloride 0.9 % 50 mL chemo infusion  5.6 mg/m2 (Treatment Plan Recorded) Intravenous Once Brunetta Genera, MD        REVIEW OF SYSTEMS:   A 10+ POINT REVIEW OF SYSTEMS WAS OBTAINED including neurology, dermatology, psychiatry, cardiac, respiratory, lymph, extremities, GI, GU, Musculoskeletal, constitutional, breasts, reproductive, HEENT.  All pertinent positives are noted in the HPI.  All others are negative.  PHYSICAL EXAMINATION:  ECOG PERFORMANCE STATUS: 1 - Symptomatic but completely ambulatory  . Vitals:   03/01/17 1059  BP: (!) 144/97  Pulse: 70  Resp: 17  Temp: 97.8 F (36.6 C)  SpO2: 100%   Filed Weights   03/01/17 1059  Weight: 169 lb 8 oz (76.9 kg)   .Body mass index is 32.03 kg/m.  GENERAL:alert, in no acute distress and comfortable SKIN: no acute rashes, no significant lesions EYES: conjunctiva are pink and non-injected, sclera anicteric OROPHARYNX: MMM, no exudates, no oropharyngeal erythema or ulceration NECK: supple, no JVD LYMPH: resolved lymphadenopathy previously present in bilaterally in her  neck , occipital area, supraclavicular, bilateral axillary and inguinal areas  LUNGS: clear to auscultation b/l with normal respiratory effort HEART: regular rate & rhythm ABDOMEN:  normoactive bowel sounds , non tender, not distended. Borderline palpable splenomegaly, no palpable hepatomegaly. Extremity: no pedal edema PSYCH: alert & oriented x 3 with fluent speech NEURO: no focal motor/sensory deficits  LABORATORY DATA:  I  have reviewed the data as listed  . CBC Latest Ref Rng & Units 03/01/2017 02/15/2017 02/01/2017  WBC 3.9 - 10.3 10e3/uL 6.5 7.6 10.9(H)  Hemoglobin 11.6 - 15.9 g/dL 11.7 12.0 11.7  Hematocrit 34.8 - 46.6 % 35.4 36.1 35.5  Platelets 145 - 400 10e3/uL 376 329 288   ANC 5k . CBC    Component Value Date/Time   WBC 6.5 03/01/2017 1033   WBC 9.1 12/31/2016 0300   RBC 3.80 03/01/2017 1033   RBC 3.97 12/31/2016 0300   HGB 11.7 03/01/2017 1033   HCT 35.4 03/01/2017 1033   PLT 376 03/01/2017 1033   MCV 93.2 03/01/2017 1033   MCH 30.8 03/01/2017 1033   MCH 30.2 12/31/2016 0300   MCHC 33.1 03/01/2017 1033   MCHC 35.2 12/31/2016 0300   RDW 15.4 (H) 03/01/2017 1033   LYMPHSABS 1.3 03/01/2017 1033   MONOABS 0.9 03/01/2017 1033   EOSABS 0.5 03/01/2017 1033   BASOSABS 0.1 03/01/2017 1033    . CMP Latest Ref Rng & Units 03/01/2017 02/15/2017 02/01/2017  Glucose 70 - 140 mg/dl 92 107 84  BUN 7.0 - 26.0 mg/dL 3.6(L) 2.9(L) 2.7(L)  Creatinine 0.6 - 1.1 mg/dL 0.7 0.7 0.7  Sodium 136 - 145 mEq/L 140 140 140  Potassium 3.5 - 5.1 mEq/L 3.7 3.7 3.6  Chloride 101 - 111 mmol/L - - -  CO2 22 - 29 mEq/L '28 26 29  ' Calcium 8.4 - 10.4 mg/dL 9.5 9.6 9.4  Total Protein 6.4 - 8.3 g/dL 6.9 6.7 6.5  Total Bilirubin 0.20 - 1.20 mg/dL 0.22 <0.22 <0.22  Alkaline Phos 40 - 150 U/L 76 70 69  AST 5 - 34 U/L '20 22 18  ' ALT 0 - 55 U/L '12 15 13   ' .  RADIOGRAPHIC STUDIES: I have personally reviewed the radiological images as listed and agreed with the findings in the report. No results  found.   NM PET Image Restag Skull Base to Thigh, 01/02/2017 IMPRESSION: 1. Marked reduction in size and metabolic activity of axillary adenopathy, mediastinal adenopathy, and periaortic retroperitoneal adenopathy. Activity of these nodes is similar to mediastinal activity and less than liver activity ( Deauville 2). 2. Decreased in size and metabolic activity of bilateral iliac lymphadenopathy with enlarged lymph nodes remaining ( Deauville 2). 3. Most intense activity remains within LEFT inguinal lymph node with activity above liver activity Deauville 4. However, this lymph node was not metabolic on comparison examination and is normal size. Potential reactive node. 4. Increase in marrow activity is favored GCSF type response. 5. Mild metabolic activity associated ground-glass opacities in the upper lobes is favored infectious or inflammatory. Correlate with drug reaction / toxicity.   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  PET/CT after 3 cycles of treatment with excellent response to treatment thus far.  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  PLAN -labs are stable  -plan was to continue A-AVD, but with decreased Bretuximab dose  from 1.87m/m2 to 0.951mm2 to hopefully help with neuropathy, arthralgias/myalgias. Hopefully helps with this and her last dose of chemotherapy. Patient appropriate to proceed with C6D1 of treatment at this time. Labs  reviewed and are stable. She will have her last dose on C6D15 following this.  -We will reevaluate with PET/CT in 4 weeks following her last dose.  -vicodin prn for pain -continue on  Cymbalta 45m po daily and norco prn for pain from neuropathy and neulasta. -continue working with rehab for strength and endurance and to help with fatigue. -discussed importance of maintaining good po food intake.   #4 Grade 1 oral mucositis and oral thrush. Now resolved status post use of nystatin. Plan  - oral cryotherapy while getting chemotherapy - patient was counseled on this. - given refills for Magic mouthwash when necessary   #3 history of iron deficiency anemia Has been on oral iron that causes significant constipation and is still significantly anemic. S/p IV Injectafer 7569mx 1 dose  hgb has improved. Ferritin adequate.  Lab Results  Component Value Date   FERRITIN 235 01/18/2017   plan  - No indication for additional iron replacement at this time  #4 history of asthma. 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 Patient notes that she tolerates eating shrimp. -Prescribed EpiPen for when necessary use. -Recommended also to carry Benadryl.  #5 general medical cares -reminded again to get a PCP  -Advised that she would be able to travel in November if she is feeling up to it.    -plz schedule C6D15 treatment in 2 weeks -PET/CT in 4wks.  -RTC with Dr KaIrene Limbon 5 weeks with PET/CT and labs     GaSullivan LoneD MSJonestownAHIVMS SCAspirus Iron River Hospital & ClinicsTTranssouth Health Care Pc Dba Ddc Surgery Centerematology/Oncology Physician CoBlackhawk(Office):       [phone] cell):  [phone]):           [phone] document serves as a record of services personally performed by GaSullivan LoneMD. It was created on his behalf by WiReola Moshera trained medical scribe. The creation of this record is based on the scribe's personal observations and the provider's statements to them. This document has been checked and approved by the attending  provider.   ADDENDUM   Patient in the infusion room had issues with port-a-cath functioning. Port-a-cath as flushing but unable to withdraw blood. IR contacted but could not help with dye study and port-a-cath evaluation till Tuesday. PIV line attempted by RN a few times and was finally successful Unfortunately despite all precautions PIV infiltrated with 2-3 ml of doxorubicin. Most of the infiltrated solution was aspirated out of the PIV line. Ice was immediately applied. No further chemotherapy was given. I evaluated the patient and no overt rednness or induration noted at this time. Consideration for IV Zinecard was made and after discussing pros vs cons of IV Zinecard and considering the low volume of infiltrated and potential Ad/E of zinecard we decided not to proceed with this. DMSO 50% solution applied to site Plan -no further attempts at chemotherapy today -IR consultation for port study on Tuesday -Ice infiltrated site for 15-20 minutes 4-6 times daily -DMSO to area 2X size of infiltrated site 4 times daily for10-14 days -f/u for infiltrate site examination in infusion room in 24h, 48h and 5-7 days. -re-schedule C6D1 A-AVD chemotherapy on Tuesday 10/2 after port assessment. Will need to re-schedule C6D15 treatment dates accordingly.   TT 4542m >50% on direct patient contact and co-ordination of care.   .GaBrunetta Genera Leah

## 2017-03-01 NOTE — Patient Instructions (Addendum)
Please follow instructions on Extravasation Patient Teaching Instructions Sheet. Return to clinic tomorrow between 12:30pm and 1:00pm for evaluation and other appointments per instruction sheet.  Per [facility] Extravasation Protocol and Dr. Grier Mitts orders, cover affected area (approximately twice the size of the affected area) with 50% DMSO solution every 6 hours for 14 days. Per Dr. Irene Limbo, allow solution to stay on skin for 10-20 mins each time and then allow to air dry.  If the affected area worsens in any way, call the clinic @ [phone] or go to your nearest Emergency Department.

## 2017-03-01 NOTE — Telephone Encounter (Signed)
Pt to complete dye study in IR on Tuesday. Staff message sent to desk RN to f/u on Monday. Pt rescheduled for chemotherapy on Tuesday in the afternoon. Pt aware of chemotherapy appt.

## 2017-03-01 NOTE — Progress Notes (Unsigned)
Prior to administering doxorubicin, attempted to aspirate for blood return from port. Unable to obtain blood return. Attempted to flush with normal saline and noted resistance. Port deaccessed and reaccessed without difficulty. Brisk blood return noted; however, patient c/o right-sided neck pain with flushing and aspiration. Informed Dr. Irene Limbo. Dr. Irene Limbo advised to obtain peripheral IV for treatment. Unsuccessful attempt x 1 left anterior forearm by this RN. Jesse Fall, RN unsuccessful attempt x 1 right posterior forearm medial aspect, then successful IV attempt x 1 right posterior forearm lateral aspect. Patient denied discomfort. IV normal saline freely dripped to gravity without signs/symptoms of infiltration. This RN began administering doxorubicin (diluted with normal saline for a total volume of 60 mL-withdrew from bag hanging for flush) via this site with brisk blood return and IV normal saline infusing via gravity. Blood return obtained every 1-2 mL's of doxorubicin administered. After administering 10 mL's of doxorubicin, patient began c/o discomfort at IV site. Doxorubicin administration immediately interrupted (approximately 7mg  of total dose administered). Site soft to touch, blood return present, and 3 mL aspirated from site. Cold pack applied and Dr. Irene Limbo notified. Dr. Irene Limbo came to treatment area to evaluate patient. Photos of IV site obtained with patient's permission. Ice pack and IV catheter removed. Gauze saturated with 50% DMSO solution applied to affected area (covering approximately twice the affected area) for 20 minutes. Removed solution and allowed to air dry. At time of discharge, patient continues to c/o slight tenderness to affected area. 1cm x 2.5cm slightly ecchymotic/indurated area noted at IV insertion site. Dr. Irene Limbo aware. Discharge instructions, including Extravasation Patient Teaching Instructions Sheet, reviewed with patient in great detail. Patient was able to verbalize all  instructions and understands the importance of follow-up visits. Also aware of importance of notifying the clinic if the sight worsens in any way.

## 2017-03-01 NOTE — Telephone Encounter (Signed)
Gave avs and calendar for October and November  °

## 2017-03-01 NOTE — Telephone Encounter (Signed)
Spoke with patient re 10/2 appointment.

## 2017-03-02 ENCOUNTER — Other Ambulatory Visit: Payer: Self-pay

## 2017-03-02 ENCOUNTER — Encounter: Payer: Self-pay | Admitting: Medical Oncology

## 2017-03-02 DIAGNOSIS — Z95828 Presence of other vascular implants and grafts: Secondary | ICD-10-CM

## 2017-03-02 NOTE — Progress Notes (Signed)
Patient in today for extravasation assessment to right posterior forearm. Assessment of site shows light bruising approximately 1.0 x 2.5 cm. Patient denies burning or itching to site, does report tenderness when light pressure applied. No redness or edema present. Patient confirms to be following physician instructions for care and knows to call with issues. Patient to return tomorrow as scheduled.

## 2017-03-04 ENCOUNTER — Ambulatory Visit: Payer: Self-pay

## 2017-03-05 ENCOUNTER — Encounter (HOSPITAL_COMMUNITY): Payer: Self-pay | Admitting: Interventional Radiology

## 2017-03-05 ENCOUNTER — Ambulatory Visit (HOSPITAL_COMMUNITY)
Admission: RE | Admit: 2017-03-05 | Discharge: 2017-03-05 | Disposition: A | Discharge status: Home / Self Care | Payer: Self-pay | Source: Ambulatory Visit | Attending: Hematology | Admitting: Hematology

## 2017-03-05 ENCOUNTER — Ambulatory Visit (HOSPITAL_BASED_OUTPATIENT_CLINIC_OR_DEPARTMENT_OTHER): Payer: Self-pay

## 2017-03-05 ENCOUNTER — Telehealth: Payer: Self-pay

## 2017-03-05 VITALS — BP 140/69 | HR 73 | Temp 98.9°F | Resp 18

## 2017-03-05 DIAGNOSIS — Z5111 Encounter for antineoplastic chemotherapy: Secondary | ICD-10-CM

## 2017-03-05 DIAGNOSIS — Z452 Encounter for adjustment and management of vascular access device: Secondary | ICD-10-CM | POA: Insufficient documentation

## 2017-03-05 DIAGNOSIS — Z5112 Encounter for antineoplastic immunotherapy: Secondary | ICD-10-CM

## 2017-03-05 DIAGNOSIS — C8198 Hodgkin lymphoma, unspecified, lymph nodes of multiple sites: Secondary | ICD-10-CM

## 2017-03-05 DIAGNOSIS — C8190 Hodgkin lymphoma, unspecified, unspecified site: Secondary | ICD-10-CM | POA: Insufficient documentation

## 2017-03-05 DIAGNOSIS — Z5189 Encounter for other specified aftercare: Secondary | ICD-10-CM

## 2017-03-05 HISTORY — PX: IR CV LINE INJECTION: IMG2294

## 2017-03-05 MED ORDER — ALBUTEROL SULFATE (2.5 MG/3ML) 0.083% IN NEBU
2.5000 mg | INHALATION_SOLUTION | Freq: Once | RESPIRATORY_TRACT | Status: DC
  Filled 2017-03-05: qty 3

## 2017-03-05 MED ORDER — PALONOSETRON HCL INJECTION 0.25 MG/5ML
0.2500 mg | Freq: Once | INTRAVENOUS | Status: AC
  Administered 2017-03-05: 0.25 mg via INTRAVENOUS

## 2017-03-05 MED ORDER — HEPARIN SOD (PORK) LOCK FLUSH 100 UNIT/ML IV SOLN
INTRAVENOUS | Status: AC
  Filled 2017-03-05: qty 5

## 2017-03-05 MED ORDER — PALONOSETRON HCL INJECTION 0.25 MG/5ML
INTRAVENOUS | Status: AC
  Filled 2017-03-05: qty 5

## 2017-03-05 MED ORDER — IOPAMIDOL (ISOVUE-300) INJECTION 61%
10.0000 mL | Freq: Once | INTRAVENOUS | Status: AC | PRN
  Administered 2017-03-05: 7 mL via INTRAVENOUS

## 2017-03-05 MED ORDER — SODIUM CHLORIDE 0.9 % IV SOLN
Freq: Once | INTRAVENOUS | Status: AC
  Administered 2017-03-05: 14:00:00 via INTRAVENOUS

## 2017-03-05 MED ORDER — SODIUM CHLORIDE 0.9% FLUSH
10.0000 mL | INTRAVENOUS | Status: DC | PRN
  Administered 2017-03-05: 10 mL
  Filled 2017-03-05: qty 10

## 2017-03-05 MED ORDER — HEPARIN SOD (PORK) LOCK FLUSH 100 UNIT/ML IV SOLN
500.0000 [IU] | Freq: Once | INTRAVENOUS | Status: AC | PRN
  Administered 2017-03-05: 500 [IU]
  Filled 2017-03-05: qty 5

## 2017-03-05 MED ORDER — SODIUM CHLORIDE 0.9 % IV SOLN
5.6000 mg/m2 | Freq: Once | INTRAVENOUS | Status: AC
  Administered 2017-03-05: 10 mg via INTRAVENOUS
  Filled 2017-03-05: qty 10

## 2017-03-05 MED ORDER — IOPAMIDOL (ISOVUE-300) INJECTION 61%
INTRAVENOUS | Status: AC
  Administered 2017-03-05: 7 mL via INTRAVENOUS
  Filled 2017-03-05: qty 50

## 2017-03-05 MED ORDER — SODIUM CHLORIDE 0.9 % IV SOLN
0.9000 mg/kg | Freq: Once | INTRAVENOUS | Status: AC
  Administered 2017-03-05: 65 mg via INTRAVENOUS
  Filled 2017-03-05: qty 13

## 2017-03-05 MED ORDER — SODIUM CHLORIDE 0.9 % IV SOLN
375.0000 mg/m2 | Freq: Once | INTRAVENOUS | Status: AC
  Administered 2017-03-05: 660 mg via INTRAVENOUS
  Filled 2017-03-05: qty 33

## 2017-03-05 MED ORDER — FOSAPREPITANT DIMEGLUMINE INJECTION 150 MG
Freq: Once | INTRAVENOUS | Status: AC
  Administered 2017-03-05: 15:00:00 via INTRAVENOUS
  Filled 2017-03-05: qty 5

## 2017-03-05 MED ORDER — PEGFILGRASTIM 6 MG/0.6ML ~~LOC~~ PSKT
6.0000 mg | PREFILLED_SYRINGE | Freq: Once | SUBCUTANEOUS | Status: AC
  Administered 2017-03-05: 6 mg via SUBCUTANEOUS
  Filled 2017-03-05: qty 0.6

## 2017-03-05 MED ORDER — DOXORUBICIN HCL CHEMO IV INJECTION 2 MG/ML
25.0000 mg/m2 | Freq: Once | INTRAVENOUS | Status: AC
  Administered 2017-03-05: 44 mg via INTRAVENOUS
  Filled 2017-03-05: qty 22

## 2017-03-05 NOTE — Patient Instructions (Signed)
[facility] Discharge Instructions for Patients Receiving Chemotherapy  Today you received the following chemotherapy agents Doxorubicin, Vinblastine, Dacarbazine, Brentuximab  To help prevent nausea and vomiting after your treatment, we encourage you to take your nausea medication as directed   If you develop nausea and vomiting that is not controlled by your nausea medication, call the clinic.   BELOW ARE SYMPTOMS THAT SHOULD BE REPORTED IMMEDIATELY:  *FEVER GREATER THAN 100.5 F  *CHILLS WITH OR WITHOUT FEVER  NAUSEA AND VOMITING THAT IS NOT CONTROLLED WITH YOUR NAUSEA MEDICATION  *UNUSUAL SHORTNESS OF BREATH  *UNUSUAL BRUISING OR BLEEDING  TENDERNESS IN MOUTH AND THROAT WITH OR WITHOUT PRESENCE OF ULCERS  *URINARY PROBLEMS  *BOWEL PROBLEMS  UNUSUAL RASH Items with * indicate a potential emergency and should be followed up as soon as possible.  Feel free to call the clinic should you have any questions or concerns. The clinic phone number is [phone].  Please show the [place] at check-in to the Emergency Department and triage nurse.

## 2017-03-05 NOTE — Telephone Encounter (Signed)
Pt dye study time was changed from 8 to 1230. She was calling to make sure the time frame will work with her 1330 infusion appt. Told her this is OK, and to ask IR to leave the needle in for chemo.

## 2017-03-05 NOTE — Progress Notes (Signed)
Evaluated pt right forearm extravasation site.  Site has slight bruising, non tender to touch, pt denies burning and itching. Pt to continue with physician instructions.

## 2017-03-05 NOTE — Progress Notes (Signed)
Ok to use [facility] after dye study per Radiologist, Dr. Kathlene Cote.

## 2017-03-11 ENCOUNTER — Telehealth: Payer: Self-pay

## 2017-03-11 ENCOUNTER — Ambulatory Visit: Payer: Self-pay | Attending: Hematology

## 2017-03-11 ENCOUNTER — Other Ambulatory Visit: Payer: Self-pay

## 2017-03-11 DIAGNOSIS — C8198 Hodgkin lymphoma, unspecified, lymph nodes of multiple sites: Secondary | ICD-10-CM

## 2017-03-11 DIAGNOSIS — M546 Pain in thoracic spine: Secondary | ICD-10-CM | POA: Insufficient documentation

## 2017-03-11 DIAGNOSIS — R29898 Other symptoms and signs involving the musculoskeletal system: Secondary | ICD-10-CM | POA: Insufficient documentation

## 2017-03-11 NOTE — Telephone Encounter (Signed)
Dr. Irene Limbo made aware of pt symptoms: slight constipation and small amount of blood with BM. No changes to medications at this time. Left VM encouraging pt to continue taking Senna S as prescribed and pushing fluids.

## 2017-03-11 NOTE — Therapy (Addendum)
[place], [place], [address] Phone: [phone]   Fax:  [phone]  Physical Therapy Treatment  Patient Details  Name: Leah Floyd MRN: [id number] Date of Birth: 1991-05-26 Referring Provider: Dr. Sullivan Lone  Encounter Date: 03/11/2017      PT End of Session - 03/11/17 0932    Visit Number 12   Number of Visits 17   Date for PT Re-Evaluation 03/18/17   PT Start Time 0849   PT Stop Time 0931   PT Time Calculation (min) 42 min   Activity Tolerance Treatment limited secondary to medical complications (Comment)   Behavior During Therapy [facility] for tasks assessed/performed      Past Medical History:  Diagnosis Date  . Cancer [facility])    lymphoma     Past Surgical History:  Procedure Laterality Date  . ANTERIOR CRUCIATE LIGAMENT REPAIR     left  . HERNIA REPAIR     left  . IR CV LINE INJECTION  03/05/2017  . IR FLUORO GUIDE PORT INSERTION RIGHT  10/04/2016  . IR US GUIDE VASC ACCESS RIGHT  10/04/2016  . TONSILLECTOMY      There were no vitals filed for this visit.      Subjective Assessment - 03/11/17 0856    Subjective My chemo didn't go as planned Friday before last bc they couldn't access my port so I had to go back last Tuesday. They tried IV that Friday but that didn't work out so I waited until Tuesday and it went fine with my port . So y last chemo got moved to next Tuesday (03/19/17).  I've changed my plans. I'm not going back to [place] now, I think I want to travel but I don't know . I am going to look into [employer] here though bc that won't be for awhile. I think I can only tolerate a massage today, I've been having diarrhea so I don't need to be up doing anything.    Pertinent History Diagnosed with Hodgkin's lymphoma 09/21/16. Was living in [place] before that and had gotten sick in November, but was thought to have an infection or flu; large lymph node went away but a month later came  back.  Was thought to have autoimmune disease.  Then insurance ran out.  Had a number of lumps at axillae and groin and was told she might have cancer. Biopsy showed cancer and she moved back here.  Started chemo soon after that and is still on chemo.  Was diagnosed at stage IV. Was in school in [place] and then online, bt that became too stressful.  Has chemo every two weeks; feels bad right away after chemo for a week+, then has 3-4 good days before the next chemo. Is at round8 of 12 now. Had PET scan two weeks ago that showed 90% improvement so is still on chemo. Will have her next chemo 01/18/17.  Body has been achy this time.  No other health issues.  Had torn left ACL in 2010 and had surgery for that; recovered enough to go back to basketball.   Patient Stated Goals Feel a little better, more energy, less pain.   Currently in Pain? No/denies                         [facility] Adult PT Treatment/Exercise - 03/11/17 0001      Manual Therapy   Manual Therapy Scapular mobilization   Soft tissue  mobilization In Supine: soft tissue work with prolonged pressure to upper trap near levator scap insertion with cervical P/ROM to stretch tightness at upper traps. pt report some relieft, then in prone to Lt periscapular area with trigger point release   Scapular Mobilization to Lt scapula into retraction and protraction to tolerance in prone                   Short Term Clinic Goals - 01/29/17 1042      CC Short Term Goal  #1   Title Pt. will report at least 50% decrease in pain in thoracic back at area between scapulae   Baseline 65% improvement reported at this time-01/29/17   Status Achieved     CC Short Term Goal  #2   Title Pt. will tolerate at least 15 minutes of nonstops mild to moderate exercise without significant shortness of breath or O2 saturation below 90%   Baseline Pt tolerated 15 mins of cardio (bike 10' and elliptical 5') with SpO2 being no less than 98% -  01/29/17   Status Achieved             Long Term Clinic Goals - 02/28/17 1138      CC Long Term Goal  #1   Title Pt. will report at least 75% decrease in pain at thoracic back at area between scapulae.   Baseline 75% improvement reported at this time-01/31/17   Status Achieved     CC Long Term Goal  #2   Title Pt. will be able to walk nonstop for 25 minutes at mild to moderate pace without needing to stop due to shortness of breath or leg pain.   Baseline 30 mins is limit now and that's just due to overall fatigue, not SOB-01/31/17   Status Achieved     CC Long Term Goal  #3   Title Pt. will be independent in a home exercise program for endurance and strengthening   Baseline Pt has been walking more and is consistent with HEP at this time which we are cont to progress as she tolerates.-01/31/17; pt is independent with HEP at this time but reports weight training/strengthening has been very beneficial for her throughout chemo when she can tolerate it as she does not have access to a gym at this time (plans to move back to [place] once last 2 chemo treaments are over) and she still needs to be closely monitored due to current treatment-02/28/17   Status On-going     CC Long Term Goal  #4   Title Pt. will be knowledgeable about community programs she can transition to for continuing exercise.   Baseline Have discussed [employer] and [facility] progream with pt, she is still deciding-01/31/17; pt plans to move back to [place] after chemo is finished next month and will look into programs there-02/28/17   Status Achieved            Plan - 03/11/17 0932    Clinical Impression Statement Pt was unable to tolerate any standing activity today as she reports she has been dealing with diarrhea since her last chemo treatment on Tuesday. She was reporting feeling very tight and sore from sleeping more due to treatment but would get up and walk around house as much as possible when able as well. So focused on  manual therapy to neck and Lt scapular area. Spoke with pt about decreasing frequency to 1x/wk as she is able to do more with HEP athome now when  able and she agreed to this. Reports might try to come 2x next week though depending on how she feels after chemo Tuesday as she goes out of town the following week.   Rehab Potential Good   Clinical Impairments Affecting Rehab Potential currently getting chemo every two weeks; will finish 03/18/17   PT Frequency 2x / week  working towards 1x/wk   PT Duration 8 weeks   PT Treatment/Interventions ADLs/Self Care Home Management;Electrical Stimulation;Moist Heat;Therapeutic exercise;Patient/family education;Manual techniques;Passive range of motion;Taping   PT Next Visit Plan Cont cardio and bil LE/UE strength as pt can tolerate; manual therapy prn.    Consulted and Agree with Plan of Care Patient      Patient will benefit from skilled therapeutic intervention in order to improve the following deficits and impairments:  Decreased endurance, Decreased activity tolerance, Pain, Postural dysfunction  Visit Diagnosis: Pain in thoracic spine  Other symptoms and signs involving the musculoskeletal system     Problem List Patient Active Problem List   Diagnosis Date Noted  . Iron deficiency anemia due to chronic blood loss 10/12/2016  . Hodgkin's lymphoma ([place]) 10/10/2016    Otelia Limes, PTA 03/11/2017, 9:35 AM  [place], [place], [address] Phone: [phone]   Fax:  [phone]  Name: Hoorain Kozakiewicz MRN: [id number] Date of Birth: 27-Dec-1990  PHYSICAL THERAPY DISCHARGE SUMMARY  Visits from Start of Care: 12  Current functional level related to goals / functional outcomes: Goals mostly met, as noted above.   Remaining deficits: Unknown:  The patient had planned to return subsequent to the last time she was here, on this date, but she did not.    Education / Equipment: Home exercise program. Plan: Patient agrees to discharge.  Patient goals were met. Patient is being discharged due to not returning since the last visit.  ?????   Serafina Royals, PT 08/01/17 3:47 PM

## 2017-03-11 NOTE — Telephone Encounter (Signed)
Pt in need of lab appointment prior to infusion on 10/16. Added at 0915. Pt c/o change in BM and some bleeding noted. Pt stated, "I usually have changes in my bowel movements after treatment, but this time is different. I had a small stool yesterday and noted some slight bright red blood. Same thing happened a second time yesterday. Today, there was a little blood, but not bright red. I just wanted Dr. Irene Limbo to be aware, but was wary to start another laxative in addition to the Senna I am already taking." Will discuss with Dr. Irene Limbo to help pt have easier BM, but not push her too far in the other direction, causing diarrhea. Pt to expect a call back with MD recommendation today.

## 2017-03-14 ENCOUNTER — Ambulatory Visit: Payer: Self-pay

## 2017-03-15 ENCOUNTER — Ambulatory Visit: Payer: Self-pay

## 2017-03-19 ENCOUNTER — Ambulatory Visit (HOSPITAL_BASED_OUTPATIENT_CLINIC_OR_DEPARTMENT_OTHER): Payer: Self-pay

## 2017-03-19 ENCOUNTER — Other Ambulatory Visit (HOSPITAL_BASED_OUTPATIENT_CLINIC_OR_DEPARTMENT_OTHER): Payer: Self-pay

## 2017-03-19 VITALS — BP 119/89 | HR 84 | Temp 97.6°F | Resp 18

## 2017-03-19 DIAGNOSIS — Z5112 Encounter for antineoplastic immunotherapy: Secondary | ICD-10-CM

## 2017-03-19 DIAGNOSIS — C8198 Hodgkin lymphoma, unspecified, lymph nodes of multiple sites: Secondary | ICD-10-CM

## 2017-03-19 DIAGNOSIS — Z5111 Encounter for antineoplastic chemotherapy: Secondary | ICD-10-CM

## 2017-03-19 LAB — COMPREHENSIVE METABOLIC PANEL
ALBUMIN: 3.6 g/dL (ref 3.5–5.0)
ALK PHOS: 72 U/L (ref 40–150)
ALT: 13 U/L (ref 0–55)
AST: 17 U/L (ref 5–34)
Anion Gap: 8 mEq/L (ref 3–11)
BUN: 3.2 mg/dL — AB (ref 7.0–26.0)
CO2: 28 mEq/L (ref 22–29)
Calcium: 9.7 mg/dL (ref 8.4–10.4)
Chloride: 105 mEq/L (ref 98–109)
Creatinine: 0.7 mg/dL (ref 0.6–1.1)
EGFR: >60 mL/min/{1.73_m2} (ref >60)
GLUCOSE: 74 mg/dL (ref 70–140)
POTASSIUM: 3.8 meq/L (ref 3.5–5.1)
SODIUM: 141 meq/L (ref 136–145)
Total Bilirubin: <0.22 mg/dL (ref 0.20–1.20)
Total Protein: 7.2 g/dL (ref 6.4–8.3)

## 2017-03-19 LAB — CBC WITH DIFFERENTIAL/PLATELET
BASO%: 1.4 % (ref 0.0–2.0)
BASOS ABS: 0.1 10*3/uL (ref 0.0–0.1)
EOS ABS: 0.7 10*3/uL — AB (ref 0.0–0.5)
EOS%: 7.6 % — ABNORMAL HIGH (ref 0.0–7.0)
HEMATOCRIT: 37.5 % (ref 34.8–46.6)
HEMOGLOBIN: 12.6 g/dL (ref 11.6–15.9)
LYMPH%: 13.2 % — ABNORMAL LOW (ref 14.0–49.7)
MCH: 31.3 pg (ref 25.1–34.0)
MCHC: 33.6 g/dL (ref 31.5–36.0)
MCV: 93.3 fL (ref 79.5–101.0)
MONO#: 0.6 10*3/uL (ref 0.1–0.9)
MONO%: 6.6 % (ref 0.0–14.0)
NEUT#: 6.6 10*3/uL — ABNORMAL HIGH (ref 1.5–6.5)
NEUT%: 71.2 % (ref 38.4–76.8)
PLATELETS: 291 10*3/uL (ref 145–400)
RBC: 4.02 10*6/uL (ref 3.70–5.45)
RDW: 16.2 % — AB (ref 11.2–14.5)
WBC: 9.3 10*3/uL (ref 3.9–10.3)
lymph#: 1.2 10*3/uL (ref 0.9–3.3)

## 2017-03-19 MED ORDER — SODIUM CHLORIDE 0.9 % IV SOLN
Freq: Once | INTRAVENOUS | Status: AC
  Administered 2017-03-19: 12:00:00 via INTRAVENOUS
  Filled 2017-03-19: qty 5

## 2017-03-19 MED ORDER — HYDROCODONE-ACETAMINOPHEN 5-325 MG PO TABS: 1.0000 | ORAL_TABLET | Freq: Four times a day (QID) | ORAL | 0 refills | Status: DC | PRN

## 2017-03-19 MED ORDER — HEPARIN SOD (PORK) LOCK FLUSH 100 UNIT/ML IV SOLN
500.0000 [IU] | Freq: Once | INTRAVENOUS | Status: AC | PRN
  Administered 2017-03-19: 500 [IU]
  Filled 2017-03-19: qty 5

## 2017-03-19 MED ORDER — VINBLASTINE SULFATE CHEMO INJECTION 1 MG/ML
5.6000 mg/m2 | Freq: Once | INTRAVENOUS | Status: AC
  Administered 2017-03-19: 10 mg via INTRAVENOUS
  Filled 2017-03-19: qty 10

## 2017-03-19 MED ORDER — SODIUM CHLORIDE 0.9 % IV SOLN
375.0000 mg/m2 | Freq: Once | INTRAVENOUS | Status: AC
  Administered 2017-03-19: 660 mg via INTRAVENOUS
  Filled 2017-03-19: qty 33

## 2017-03-19 MED ORDER — PALONOSETRON HCL INJECTION 0.25 MG/5ML
0.2500 mg | Freq: Once | INTRAVENOUS | Status: AC
  Administered 2017-03-19: 0.25 mg via INTRAVENOUS

## 2017-03-19 MED ORDER — PALONOSETRON HCL INJECTION 0.25 MG/5ML
INTRAVENOUS | Status: AC
  Filled 2017-03-19: qty 5

## 2017-03-19 MED ORDER — PEGFILGRASTIM 6 MG/0.6ML ~~LOC~~ PSKT
6.0000 mg | PREFILLED_SYRINGE | Freq: Once | SUBCUTANEOUS | Status: AC
  Administered 2017-03-19: 6 mg via SUBCUTANEOUS
  Filled 2017-03-19: qty 0.6

## 2017-03-19 MED ORDER — SODIUM CHLORIDE 0.9% FLUSH
10.0000 mL | INTRAVENOUS | Status: DC | PRN
  Administered 2017-03-19: 10 mL
  Filled 2017-03-19: qty 10

## 2017-03-19 MED ORDER — SODIUM CHLORIDE 0.9 % IV SOLN
0.9000 mg/kg | Freq: Once | INTRAVENOUS | Status: AC
  Administered 2017-03-19: 65 mg via INTRAVENOUS
  Filled 2017-03-19: qty 13

## 2017-03-19 MED ORDER — SODIUM CHLORIDE 0.9 % IV SOLN
Freq: Once | INTRAVENOUS | Status: AC
  Administered 2017-03-19: 11:00:00 via INTRAVENOUS

## 2017-03-19 MED ORDER — DOXORUBICIN HCL CHEMO IV INJECTION 2 MG/ML
25.0000 mg/m2 | Freq: Once | INTRAVENOUS | Status: AC
  Administered 2017-03-19: 44 mg via INTRAVENOUS
  Filled 2017-03-19: qty 22

## 2017-03-19 NOTE — Progress Notes (Signed)
Pt states she got overheated and nauseated after walking to bathroom . Settled into chair and ice pack applied to head. Symptom resolved.

## 2017-03-19 NOTE — Patient Instructions (Addendum)
[facility] Discharge Instructions for Patients Receiving Chemotherapy  Today you received the following chemotherapy agents Adriamycin, vinblastine and Dacarbazine. You also received an immunotherapy drug called Brentuximab To help prevent nausea and vomiting after your treatment, we encourage you to take your nausea medication as prescribed: ondansetron (ZOFRAN) 8 MG tablet 30 tablet 1 10/12/2016    Sig - Route: Take 1 tablet (8 mg total) by mouth 2 (two) times daily as needed. Start on the third day after chemotherapy. - Oral    prochlorperazine (COMPAZINE) 10 MG tablet 30 tablet 1 11/22/2016    Sig - Route: Take 1 tablet (10 mg total) by mouth every 6 (six) hours as needed (Nausea or vomiting). - Oral   Sent to pharmacy as: prochlorperazine (COMPAZINE) 10 MG tablet    LORazepam (ATIVAN) 0.5 MG tablet 30 tablet 0 11/22/2016    Sig - Route: Take 1 tablet (0.5 mg total) by mouth every 6 (six) hours as needed (Nausea or vomiting). - Oral       If you develop nausea and vomiting that is not controlled by your nausea medication, call the clinic.   BELOW ARE SYMPTOMS THAT SHOULD BE REPORTED IMMEDIATELY:  *FEVER GREATER THAN 100.5 F  *CHILLS WITH OR WITHOUT FEVER  NAUSEA AND VOMITING THAT IS NOT CONTROLLED WITH YOUR NAUSEA MEDICATION  *UNUSUAL SHORTNESS OF BREATH  *UNUSUAL BRUISING OR BLEEDING  TENDERNESS IN MOUTH AND THROAT WITH OR WITHOUT PRESENCE OF ULCERS  *URINARY PROBLEMS  *BOWEL PROBLEMS  UNUSUAL RASH Items with * indicate a potential emergency and should be followed up as soon as possible.  Feel free to call the clinic should you have any questions or concerns. The clinic phone number is [phone].  Please show the [facility] at check-in to the Emergency Department and triage nurse.

## 2017-04-03 ENCOUNTER — Encounter (HOSPITAL_COMMUNITY)
Admission: RE | Admit: 2017-04-03 | Discharge: 2017-04-03 | Disposition: A | Discharge status: Home / Self Care | Payer: Self-pay | Source: Ambulatory Visit | Attending: Hematology | Admitting: Hematology

## 2017-04-03 DIAGNOSIS — C8198 Hodgkin lymphoma, unspecified, lymph nodes of multiple sites: Secondary | ICD-10-CM | POA: Insufficient documentation

## 2017-04-03 LAB — GLUCOSE, CAPILLARY: GLUCOSE-CAPILLARY: 102 mg/dL — AB (ref 65–99)

## 2017-04-03 MED ORDER — FLUDEOXYGLUCOSE F - 18 (FDG) INJECTION
9.1400 | Freq: Once | INTRAVENOUS | Status: AC | PRN
  Administered 2017-04-03: 9.14 via INTRAVENOUS

## 2017-04-04 NOTE — Progress Notes (Signed)
HEMATOLOGY/ONCOLOGY CLINIC NOTE  Date of Service: 04/05/17  Patient Care Team: Brunetta Genera, MD as PCP - General (Hematology) Patient, No Pcp Per (General Practice)  CHIEF COMPLAINTS/PURPOSE OF CONSULTATION:  F/u classical Hodgkin's lymphoma  HISTORY OF PRESENTING ILLNESS:   Leah Floyd is a wonderful 26 y.o. female who has been referred to [place] by [employer] [facility] for evaluation and management of newly diagnosed classical Hodgkin's lymphoma.  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  She notes that in November 2017 she developed generalized body aches and flulike symptoms with some subjective fevers chills and night sweats. A primary care physician noted a right axillary lump and she was treated empirically with Z-Pak for possible infection. She notes that around Christmas time in 2017 she developed worsening fatigue and generalized body aches a point that she was having difficulties getting through the day. She notes that she had an autoimmune workup HIV testing which was unrevealing she was noted to have some iron deficiency. She notes that she had a follow-up ultrasound that showed right axillary lymph node was enlarging and she also had a left axillary lymph node and had noted inguinal lymph nodes in the groin. Some of her workup was delayed due to insurance issues that she eventually had a left axillary lymph node biopsy on 09/24/2016 accession number [id number] which showed classical Hodgkin's lymphoma.  Patient then has moved back from [place] to be with her mother and family in [place], and is here to seek further cares for her classical Hodgkin's lymphoma newly diagnosed.  Patient notes significant fatigue, night sweats, weight loss of about 40 pounds in the  last 4-5 months, generalized body aches, dry cough and loss of appetite.  She notes that she previously had heavy menstrual periods lasting 7 days which are now become lighter and last about 4 days. She has been taking ferrous sulfate plus vitamin C for her iron deficiency anemia.  No previous heart problems. Has been having some mild diarrhea which she describes as soft stools 2-3 times a day.  INTERVAL HISTORY  Leah Floyd is here for a followup following completion of chemotherapy. She reports that she is feeling better physically,  emotionally and spiritually following completion of treatment. She is still currently taking Cymbalta. She is excited to be completing school this December, 2018 and notes that she has to complete her portfolio. She reports that she hasn't been to Physical Therapy, due to being d/c after improving so well since her first appointment. She was given references for other forms of exercise and physical therapy to aid with strength training and she plans to complete exercises. She reports that following treatment, she would like to move to the [facility]. She will be going to [place] for Christmas with her entire family. She has been walking more in order to build up her stamina.   She voices that she would like to complete a Juice Cleanse and voices if she would be able to do so. She voices concern for skin care aid, due to following treatment her skin is now dryer. She doesn't take hot showers due to increased hot flashes. 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 She notes that she has completed 5 blogs throughout her treatment to aid with getting through her treatments. She  has gotten positive response through her blogs.   PET/CT results were reviewed in details with the patient and shows no disease > deauville 2.  On review of systems, she reports neuropathy to her bilateral fingers resembling tingling sensation. She reports that she has not had a  menstrual cycle, but has had bloating and similar pre-menstrual cycle symptoms. She reports that she is starting to obtain taste again. She reports myalgias for 2 days out of the week. She denies decreased energy levels. Denies abdominal pain or bowel movement issues. Denies neuropathy to BLE. She reports resolved swelling to her BLE.     MEDICAL HISTORY:   #1 iron deficiency anemia #2 GERD #3 obesity. #4 pseudotumor cerebri however this has resolved with her weight loss as per patient. #5 asthma #6 of newly diagnosed Hodgkin's lymphoma #7 shellfish allergy #8 history of Chlamydia infection   SURGICAL HISTORY: Past Surgical History:  Procedure Laterality Date  . ANTERIOR CRUCIATE LIGAMENT REPAIR     left  . HERNIA REPAIR     left  . IR CV LINE INJECTION  03/05/2017  . IR FLUORO GUIDE PORT INSERTION RIGHT  10/04/2016  . IR US GUIDE VASC ACCESS RIGHT  10/04/2016  . TONSILLECTOMY      SOCIAL HISTORY: Social History   Social History  . Marital status: Single    Spouse name: N/A  . Number of children: N/A  . Years of education: N/A   Occupational History  . Not on file.   Social History Main Topics  . Smoking status: Never Smoker  . Smokeless tobacco: Never Used  . Alcohol use Yes     Comment: socially  . Drug use: Yes    Types: Marijuana     Comment: medical   . Sexual activity: Not on file   Other Topics Concern  . Not on file   Social History Narrative  . No narrative on file    FAMILY HISTORY: Notes her first cousin on her mom's side had some form off rare bone marrow cancer Does not know history on her father's side of the family Maternal grandmother-hypertension, diabetes, fibromyalgia Mother anemia Other relatives on her mom's side with lymphoma and bladder cancer.  ALLERGIES:  is allergic to shellfish-derived products.  MEDICATIONS:  Current Outpatient Medications  Medication Sig Dispense Refill  . DULoxetine (CYMBALTA) 30 MG capsule Take 2 capsules  (60 mg total) by mouth daily. 60 capsule 1  . pantoprazole (PROTONIX) 40 MG tablet Take 1 tablet (40 mg total) by mouth daily. 30 tablet 1  . sucralfate (CARAFATE) 1 GM/10ML suspension Take 10 mLs (1 g total) by mouth 4 (four) times daily -  with meals and at bedtime. 420 mL 0  . albuterol (PROVENTIL HFA;VENTOLIN HFA) 108 (90 Base) MCG/ACT inhaler Inhale 2 puffs into the lungs every 6 (six) hours as needed for wheezing or shortness of breath. (Patient not taking: Reported on 04/05/2017) 1 Inhaler 2  . Ascorbic Acid (VITAMIN C PO) Take by mouth.    . Cyanocobalamin (VITAMIN B-12 CR PO) Take 1 tablet by mouth daily.    [name] dexamethasone (DECADRON) 4 MG tablet Take 2 tablets by mouth once a day on the day after chemotherapy and then take 2 tablets two times a day for 2 days. Take with food. (Patient not taking: Reported on 04/05/2017) 30 tablet 1  . diphenhydramine-acetaminophen (TYLENOL PM) 25-500 MG TABS tablet Take 2 tablets by mouth at bedtime as needed.    [name] EPINEPHrine 0.3 mg/0.3  mL IJ SOAJ injection Inject 0.3 mLs (0.3 mg total) into the muscle once. For anaphylactic reactions 1 Device 0  . HYDROcodone-acetaminophen (NORCO/VICODIN) 5-325 MG tablet Take 1-2 tablets by mouth every 6 (six) hours as needed for moderate pain or severe pain. (Patient not taking: Reported on 04/05/2017) 30 tablet 0  . lidocaine-prilocaine (EMLA) cream Apply to affected area once (Patient not taking: Reported on 04/05/2017) 30 g 3  . loratadine (CLARITIN) 10 MG tablet Take 10 mg by mouth daily.    [name] LORazepam (ATIVAN) 0.5 MG tablet Take 1 tablet (0.5 mg total) by mouth every 6 (six) hours as needed (Nausea or vomiting). (Patient not taking: Reported on 04/05/2017) 30 tablet 0  . LORazepam (ATIVAN) 0.5 MG tablet Take 1 tablet (0.5 mg total) by mouth every 6 (six) hours as needed (Nausea or vomiting). (Patient not taking: Reported on 04/05/2017) 30 tablet 0  . magic mouthwash w/lidocaine SOLN Take 5 mLs by mouth 4 (four) times daily  as needed for mouth pain. (Patient not taking: Reported on 04/05/2017) 120 mL 0  . ondansetron (ZOFRAN) 8 MG tablet Take 1 tablet (8 mg total) by mouth 2 (two) times daily as needed. Start on the third day after chemotherapy. (Patient not taking: Reported on 04/05/2017) 30 tablet 1  . PEGFILGRASTIM [place] Apply 1 patch topically once.    . prochlorperazine (COMPAZINE) 10 MG tablet Take 1 tablet (10 mg total) by mouth every 6 (six) hours as needed (Nausea or vomiting). (Patient not taking: Reported on 04/05/2017) 30 tablet 1  . senna-docusate (SENNA S) 8.6-50 MG tablet Take 2 tablets by mouth at bedtime. Hold if diarrhea ensues (Patient not taking: Reported on 04/05/2017) 60 tablet 1   No current facility-administered medications for this visit.    Facility-Administered Medications Ordered in Other Visits  Medication Dose Route Frequency Provider Last Rate Last Dose  . albuterol (PROVENTIL) (2.5 MG/3ML) 0.083% nebulizer solution 2.5 mg  2.5 mg Nebulization Once Brunetta Genera, MD      . brentuximab vedotin (ADCETRIS) 65 mg in sodium chloride 0.9 % 100 mL chemo infusion  0.9 mg/kg (Treatment Plan Recorded) Intravenous Once Brunetta Genera, MD      . dacarbazine (DTIC) 660 mg in sodium chloride 0.9 % 250 mL chemo infusion  375 mg/m2 (Treatment Plan Recorded) Intravenous Once Brunetta Genera, MD      . heparin lock flush 100 unit/mL  500 Units Intracatheter Once PRN Brunetta Genera, MD      . pegfilgrastim (NEULASTA ONPRO KIT) injection 6 mg  6 mg Subcutaneous Once Brunetta Genera, MD      . sodium chloride flush (NS) 0.9 % injection 10 mL  10 mL Intracatheter PRN Brunetta Genera, MD      . vinBLAStine (VELBAN) 10 mg in sodium chloride 0.9 % 50 mL chemo infusion  5.6 mg/m2 (Treatment Plan Recorded) Intravenous Once Brunetta Genera, MD        REVIEW OF SYSTEMS:   A 10+ POINT REVIEW OF SYSTEMS WAS OBTAINED including neurology, dermatology, psychiatry, cardiac, respiratory,  lymph, extremities, GI, GU, Musculoskeletal, constitutional, breasts, reproductive, HEENT.  All pertinent positives are noted in the HPI.  All others are negative.  PHYSICAL EXAMINATION:  ECOG PERFORMANCE STATUS: 1 - Symptomatic but completely ambulatory  . Vitals:   04/05/17 0853  BP: 130/77  Pulse: 93  Resp: 18  Temp: 98.7 F (37.1 C)  SpO2: 99%   Filed Weights   04/05/17 0853  Weight: 179  lb 3.2 oz (81.3 kg)   .Body mass index is 33.86 kg/m.  GENERAL:alert, in no acute distress and comfortable SKIN: no acute rashes, no significant lesions. Site on right forearm from previous infiltration shows no change in ulceration, change in pigmentation, and no other major changes noted.  EYES: conjunctiva are pink and non-injected, sclera anicteric OROPHARYNX: MMM, no exudates, no oropharyngeal erythema or ulceration. No thrush.  NECK: supple, no JVD LYMPH: resolved lymphadenopathy previously present in bilaterally in her neck , occipital area, supraclavicular, bilateral axillary and inguinal areas  LUNGS: clear to auscultation b/l with normal respiratory effort HEART: regular rate & rhythm ABDOMEN:  normoactive bowel sounds , non tender, not distended. Borderline palpable splenomegaly, no palpable hepatomegaly. Extremity: no pedal edema PSYCH: alert & oriented x 3 with fluent speech NEURO: no focal motor/sensory deficits    LABORATORY DATA:  I have reviewed the data as listed  . CBC Latest Ref Rng & Units 04/05/2017 03/19/2017 03/01/2017  WBC 3.9 - 10.3 10e3/uL 8.8 9.3 6.5  Hemoglobin 11.6 - 15.9 g/dL 12.6 12.6 11.7  Hematocrit 34.8 - 46.6 % 38.0 37.5 35.4  Platelets 145 - 400 10e3/uL 263 291 376   ANC 5k . CBC    Component Value Date/Time   WBC 8.8 04/05/2017 0801   WBC 9.1 12/31/2016 0300   RBC 4.08 04/05/2017 0801   RBC 3.97 12/31/2016 0300   HGB 12.6 04/05/2017 0801   HCT 38.0 04/05/2017 0801   PLT 263 04/05/2017 0801   MCV 93.1 04/05/2017 0801   MCH 30.9  04/05/2017 0801   MCH 30.2 12/31/2016 0300   MCHC 33.2 04/05/2017 0801   MCHC 35.2 12/31/2016 0300   RDW 15.4 (H) 04/05/2017 0801   LYMPHSABS 1.4 04/05/2017 0801   MONOABS 0.9 04/05/2017 0801   EOSABS 0.5 04/05/2017 0801   BASOSABS 0.1 04/05/2017 0801    . CMP Latest Ref Rng & Units 04/05/2017 03/19/2017 03/01/2017  Glucose 70 - 140 mg/dl 95 74 92  BUN 7.0 - 26.0 mg/dL 6.3(L) 3.2(L) 3.6(L)  Creatinine 0.6 - 1.1 mg/dL 0.7 0.7 0.7  Sodium 136 - 145 mEq/L 140 141 140  Potassium 3.5 - 5.1 mEq/L 4.2 3.8 3.7  Chloride 101 - 111 mmol/L - - -  CO2 22 - 29 mEq/L '26 28 28  ' Calcium 8.4 - 10.4 mg/dL 9.4 9.7 9.5  Total Protein 6.4 - 8.3 g/dL 6.9 7.2 6.9  Total Bilirubin 0.20 - 1.20 mg/dL <0.22 <0.22 0.22  Alkaline Phos 40 - 150 U/L 64 72 76  AST 5 - 34 U/L '17 17 20  ' ALT 0 - 55 U/L '11 13 12   ' .  RADIOGRAPHIC STUDIES: I have personally reviewed the radiological images as listed and agreed with the findings in the report. Nm Pet Image Restag (ps) Skull Base To Thigh  Result Date: 04/03/2017 CLINICAL DATA:  Subsequent treatment strategy for skin lymphoma. EXAM: NUCLEAR MEDICINE PET SKULL BASE TO THIGH TECHNIQUE: 9.1 mCi F-18 FDG was injected intravenously. Full-ring PET imaging was performed from the skull base to thigh after the radiotracer. CT data was obtained and used for attenuation correction and anatomic localization. FASTING BLOOD GLUCOSE:  Value: 102 mg/dl COMPARISON:  Multiple exams, including 01/02/2017 FINDINGS: NECK Considerable activity in the muscular tissues of the neck and supraclavicular adipose tissues, attributed to physiologic activity and brown fat related activity. No enlarged lymph nodes. Faint activity deep to the sternocleidomastoid muscles on some images is without CT correlate other than adipose tissue, and accordingly is  not thought to be related to adenopathy. CHEST Bilateral mild axillary adenopathy including a 1.3 cm right axillary lymph node (stable size) with maximum  SUV 1.9 (formerly 2.0). No appreciable pulmonary nodule. Indistinct prevascular lymph nodes are present and somewhat indistinct, individually measuring up to 0.8 cm in short axis as on image 52/4, subjectively these appear minimally reduced compared the prior exam. There are foci of accentuated axillary and mediastinal activity corresponding to adipose tissues rather than nodes, again probably from brown fat distribution. There is also accentuated paraspinal activity in the upper thorax likely along the intercostal spaces, 18 without CT correlate and favoring brown fat distribution. Right Port-A-Cath tip: Right atrium. The prior ground-glass opacities in the lungs have resolved. Background mediastinal blood pool activity 2.4. ABDOMEN/PELVIS The previously seen hypermetabolic left inguinal lymph node currently measures 0.8 cm in short axis (previously 1.0 cm by my measurement) and has a fatty hilum, maximum SUV 1.3 compared to prior measurement of 4.9. A left external iliac node is mildly enlarged at 1.1 cm in short axis on image 157/4 (previously the same) and with maximum standard uptake value 1.8 (formerly 2.1). No other significant adenopathy is identified. Background hepatic activity SUV 3.3. Contracted gallbladder. SKELETON Generalized mild accentuation of marrow activity, similar to prior. IMPRESSION: 1. Bilateral mild axillary adenopathy along with the mild left external iliac adenopathy persists, uptake is Deauville 2 and similar to minimally reduced compared to the prior exam. Similar Deauville 2 activity in the the remaining prevascular lymph nodes which are upper normal sized and indistinctly marginated. 2. The previously hypermetabolic left inguinal lymph node is no longer significantly hypermetabolic, and may have been reactive. 3. There is considerable activity in some of the muscular and fatty tissues of the neck and chest without CT correlate, likely due to a combination of brown fat distribution and  physiologic activity. 4. Generalized mild accentuation of marrow activity, probably related to marrow/granulocyte stimulation. 5. Prior ground-glass opacities in the lungs have resolved. Electronically Signed   By: Van Clines M.D.   On: 04/03/2017 17:55     NM PET Image Restag Skull Base to Thigh, 01/02/2017 IMPRESSION: 1. Marked reduction in size and metabolic activity of axillary adenopathy, mediastinal adenopathy, and periaortic retroperitoneal adenopathy. Activity of these nodes is similar to mediastinal activity and less than liver activity ( Deauville 2). 2. Decreased in size and metabolic activity of bilateral iliac lymphadenopathy with enlarged lymph nodes remaining ( Deauville 2). 3. Most intense activity remains within LEFT inguinal lymph node with activity above liver activity Deauville 4. However, this lymph node was not metabolic on comparison examination and is normal size. Potential reactive node. 4. Increase in marrow activity is favored GCSF type response. 5. Mild metabolic activity associated ground-glass opacities in the upper lobes is favored infectious or inflammatory. Correlate with drug reaction / toxicity.   ASSESSMENT & PLAN:   26 year old female with   #1 Classical Hodgkin's lymphoma Stage IVB as per PET/CT Patient had constitutional symptoms with significant weight loss of 40 pounds, and night sweats and debilitating fatigue which has now resolved  PET/CT after 3 cycles of treatment with excellent response to treatment thus far.  PET scan on 04/03/2017 after 6 cycles of completion of treatment showed no disease greater than Deauville 2.   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  PLAN  -patient has no clinical, lab or radiographic evidence of disease progression at this time. -labs  are stable  -follow up in 2 months with labs . She understands that she had high risk disease and will need to be monitored closely. -follow up in the next week for port removal.  -continue on Cymbalta 54m po daily and norco prn for pain from neuropathy  -continue with exercise program, [employer]. She has completed planned followed up with [facility] and has done well and graduated.     #4 Grade 1 oral mucositis and oral thrush. Now resolved status post use of nystatin.  #3 history of iron deficiency anemia Has been on oral iron that causes significant constipation and is still significantly anemic. S/p IV Injectafer 7572mx 1 dose  hgb has improved. Ferritin adequate.  Lab Results  Component Value Date   FERRITIN 235 01/18/2017   plan  - No indication for additional iron replacement at this time  #4 history of asthma. 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 Patient notes that she tolerates eating shrimp. -Previously prescribed EpiPen for when necessary use. -Previously recommended also to carry Benadryl.  #5 general medical cares -reminded again to get a PCP  -Previously advised that she would be able to travel in November if she is feeling up to it.    RTC with Dr. KaIrene Limbon 2 months with labs Port removal in 1 week     GaSullivan LoneD Leah AAHIVMS SCSelect [facility] DallasTEll[facility] Divisionematology/Oncology Physician CoAshmore(Office):       [phone] cell):  [phone]):           [phone] document serves as a record of services personally performed by GaSullivan LoneMD. It was created on his behalf by SoSteva Coldera trained medical scribe. The creation of this record is based on the scribe's personal observations and the provider's statements to them. This document has  been checked and approved by the attending provider.      .I have reviewed the above documentation for accuracy and completeness, and I agree with the above. .GBrunetta GeneraD

## 2017-04-05 ENCOUNTER — Other Ambulatory Visit (HOSPITAL_BASED_OUTPATIENT_CLINIC_OR_DEPARTMENT_OTHER): Payer: Self-pay

## 2017-04-05 ENCOUNTER — Ambulatory Visit (HOSPITAL_BASED_OUTPATIENT_CLINIC_OR_DEPARTMENT_OTHER): Payer: Self-pay | Admitting: Hematology

## 2017-04-05 ENCOUNTER — Encounter: Payer: Self-pay | Admitting: Hematology

## 2017-04-05 ENCOUNTER — Telehealth: Payer: Self-pay | Admitting: Hematology

## 2017-04-05 VITALS — BP 130/77 | HR 93 | Temp 98.7°F | Resp 18 | Ht 61.0 in | Wt 179.2 lb

## 2017-04-05 DIAGNOSIS — C8198 Hodgkin lymphoma, unspecified, lymph nodes of multiple sites: Secondary | ICD-10-CM

## 2017-04-05 DIAGNOSIS — T451X5A Adverse effect of antineoplastic and immunosuppressive drugs, initial encounter: Secondary | ICD-10-CM

## 2017-04-05 DIAGNOSIS — G620 Drug-induced polyneuropathy: Secondary | ICD-10-CM

## 2017-04-05 DIAGNOSIS — Z95828 Presence of other vascular implants and grafts: Secondary | ICD-10-CM

## 2017-04-05 DIAGNOSIS — R5383 Other fatigue: Secondary | ICD-10-CM

## 2017-04-05 LAB — CBC & DIFF AND RETIC
BASO%: 1 % (ref 0.0–2.0)
BASOS ABS: 0.1 10*3/uL (ref 0.0–0.1)
EOS%: 5.5 % (ref 0.0–7.0)
Eosinophils Absolute: 0.5 10*3/uL (ref 0.0–0.5)
HEMATOCRIT: 38 % (ref 34.8–46.6)
HGB: 12.6 g/dL (ref 11.6–15.9)
Immature Retic Fract: 8.4 % (ref 1.60–10.00)
LYMPH#: 1.4 10*3/uL (ref 0.9–3.3)
LYMPH%: 15.7 % (ref 14.0–49.7)
MCH: 30.9 pg (ref 25.1–34.0)
MCHC: 33.2 g/dL (ref 31.5–36.0)
MCV: 93.1 fL (ref 79.5–101.0)
MONO#: 0.9 10*3/uL (ref 0.1–0.9)
MONO%: 10.7 % (ref 0.0–14.0)
NEUT#: 5.9 10*3/uL (ref 1.5–6.5)
NEUT%: 67.1 % (ref 38.4–76.8)
Platelets: 263 10*3/uL (ref 145–400)
RBC: 4.08 10*6/uL (ref 3.70–5.45)
RDW: 15.4 % — AB (ref 11.2–14.5)
RETIC %: 3.56 % — AB (ref 0.70–2.10)
RETIC CT ABS: 145.25 10*3/uL — AB (ref 33.70–90.70)
WBC: 8.8 10*3/uL (ref 3.9–10.3)

## 2017-04-05 LAB — COMPREHENSIVE METABOLIC PANEL
ALT: 11 U/L (ref 0–55)
AST: 17 U/L (ref 5–34)
Albumin: 3.4 g/dL — ABNORMAL LOW (ref 3.5–5.0)
Alkaline Phosphatase: 64 U/L (ref 40–150)
Anion Gap: 8 mEq/L (ref 3–11)
BUN: 6.3 mg/dL — AB (ref 7.0–26.0)
CALCIUM: 9.4 mg/dL (ref 8.4–10.4)
CHLORIDE: 107 meq/L (ref 98–109)
CO2: 26 mEq/L (ref 22–29)
Creatinine: 0.7 mg/dL (ref 0.6–1.1)
EGFR: >60 mL/min/{1.73_m2} (ref >60)
Glucose: 95 mg/dl (ref 70–140)
POTASSIUM: 4.2 meq/L (ref 3.5–5.1)
SODIUM: 140 meq/L (ref 136–145)
Total Bilirubin: <0.22 mg/dL (ref 0.20–1.20)
Total Protein: 6.9 g/dL (ref 6.4–8.3)

## 2017-04-05 NOTE — Telephone Encounter (Signed)
Gave avs and calendar for January however patient said radiology will call for port removal

## 2017-04-05 NOTE — Patient Instructions (Signed)
Thank you for choosing [facility] to provide your oncology and hematology care.  To afford each patient quality time with our providers, please arrive 30 minutes before your scheduled appointment time.  If you arrive late for your appointment, you may be asked to reschedule.  We strive to give you quality time with our providers, and arriving late affects you and other patients whose appointments are after yours.   If you are a no show for multiple scheduled visits, you may be dismissed from the clinic at the providers discretion.    Again, thank you for choosing [facility], our hope is that these requests will decrease the amount of time that you wait before being seen by our physicians.  ______________________________________________________________________  Should you have questions after your visit to the  Cancer Center, please contact our office at [phone] between the hours of 8:30 and 4:30 p.m.    Voicemails left after 4:30p.m will not be returned until the following business day.    For prescription refill requests, please have your pharmacy contact us directly.  Please also try to allow 48 hours for prescription requests.    Please contact the scheduling department for questions regarding scheduling.  For scheduling of procedures such as PET scans, CT scans, MRI, Ultrasound, etc please contact central scheduling at [phone].    Resources For Cancer Patients and Caregivers:   Oncolink.org:  A wonderful resource for patients and healthcare providers for information regarding your disease, ways to tract your treatment, what to expect, etc.     American Cancer Society:  800-227-2345  Can help patients locate various types of support and financial assistance  Cancer Care: 1-800-813-HOPE (4673) Provides financial assistance, online support groups, medication/co-pay assistance.    [place] DSS:  336-641-3447 Where to apply for food  stamps, Medicaid, and utility assistance  Medicare Rights Center: 800-333-4114 Helps people with Medicare understand their rights and benefits, navigate the Medicare system, and secure the quality healthcare they deserve  SCAT: 336-333-6589 [place] [employer]'s shared-ride transportation service for eligible riders who have a disability that prevents them from riding the fixed route bus.    For additional information on assistance programs please contact our social worker:   Grier Hock/Abigail Elmore:  [phone]            

## 2017-04-06 LAB — SEDIMENTATION RATE: SED RATE: 22 mm/h (ref 0–32)

## 2017-04-22 ENCOUNTER — Other Ambulatory Visit: Payer: Self-pay | Admitting: Student

## 2017-04-23 ENCOUNTER — Ambulatory Visit (HOSPITAL_COMMUNITY)
Admission: RE | Admit: 2017-04-23 | Discharge: 2017-04-23 | Disposition: A | Discharge status: Home / Self Care | Payer: Self-pay | Source: Ambulatory Visit | Attending: Hematology | Admitting: Hematology

## 2017-04-23 ENCOUNTER — Encounter (HOSPITAL_COMMUNITY): Payer: Self-pay

## 2017-04-23 DIAGNOSIS — Z8571 Personal history of Hodgkin lymphoma: Secondary | ICD-10-CM | POA: Insufficient documentation

## 2017-04-23 DIAGNOSIS — C8198 Hodgkin lymphoma, unspecified, lymph nodes of multiple sites: Secondary | ICD-10-CM

## 2017-04-23 DIAGNOSIS — Z452 Encounter for adjustment and management of vascular access device: Secondary | ICD-10-CM | POA: Insufficient documentation

## 2017-04-23 DIAGNOSIS — Z95828 Presence of other vascular implants and grafts: Secondary | ICD-10-CM

## 2017-04-23 DIAGNOSIS — Z9221 Personal history of antineoplastic chemotherapy: Secondary | ICD-10-CM | POA: Insufficient documentation

## 2017-04-23 HISTORY — PX: IR REMOVAL TUN ACCESS W/ PORT W/O FL MOD SED: IMG2290

## 2017-04-23 LAB — CBC
HEMATOCRIT: 37.6 % (ref 36.0–46.0)
Hemoglobin: 13 g/dL (ref 12.0–15.0)
MCH: 31.8 pg (ref 26.0–34.0)
MCHC: 34.6 g/dL (ref 30.0–36.0)
MCV: 91.9 fL (ref 78.0–100.0)
Platelets: 264 10*3/uL (ref 150–400)
RBC: 4.09 MIL/uL (ref 3.87–5.11)
RDW: 15.2 % (ref 11.5–15.5)
WBC: 4.9 10*3/uL (ref 4.0–10.5)

## 2017-04-23 LAB — PROTIME-INR
INR: 0.99
Prothrombin Time: 13 seconds (ref 11.4–15.2)

## 2017-04-23 LAB — APTT: aPTT: 29 seconds (ref 24–36)

## 2017-04-23 MED ORDER — CEFAZOLIN SODIUM-DEXTROSE 2-4 GM/100ML-% IV SOLN
INTRAVENOUS | Status: AC
Start: 2017-04-23 — End: 2017-04-23
  Administered 2017-04-23: 2000 mg via INTRAVENOUS
  Filled 2017-04-23: qty 100

## 2017-04-23 MED ORDER — MIDAZOLAM HCL 2 MG/2ML IJ SOLN
INTRAMUSCULAR | Status: AC | PRN
  Administered 2017-04-23: 1 mg via INTRAVENOUS

## 2017-04-23 MED ORDER — SODIUM CHLORIDE 0.9 % IV SOLN
INTRAVENOUS | Status: DC
  Administered 2017-04-23: 12:00:00 via INTRAVENOUS

## 2017-04-23 MED ORDER — FENTANYL CITRATE (PF) 100 MCG/2ML IJ SOLN
INTRAMUSCULAR | Status: AC
  Filled 2017-04-23: qty 2

## 2017-04-23 MED ORDER — FENTANYL CITRATE (PF) 100 MCG/2ML IJ SOLN
INTRAMUSCULAR | Status: AC | PRN
  Administered 2017-04-23: 50 ug via INTRAVENOUS

## 2017-04-23 MED ORDER — LIDOCAINE-EPINEPHRINE (PF) 2 %-1:200000 IJ SOLN
INTRAMUSCULAR | Status: AC
  Filled 2017-04-23: qty 20

## 2017-04-23 MED ORDER — CEFAZOLIN SODIUM-DEXTROSE 2-4 GM/100ML-% IV SOLN
2.0000 g | INTRAVENOUS | Status: AC
  Administered 2017-04-23: 2000 mg via INTRAVENOUS
  Filled 2017-04-23: qty 100

## 2017-04-23 MED ORDER — MIDAZOLAM HCL 2 MG/2ML IJ SOLN
INTRAMUSCULAR | Status: AC
  Filled 2017-04-23: qty 2

## 2017-04-23 NOTE — Sedation Documentation (Signed)
Patient is resting comfortably. 

## 2017-04-23 NOTE — Sedation Documentation (Signed)
Patient denies pain and is resting comfortably.  

## 2017-04-23 NOTE — Procedures (Signed)
Interventional Radiology Procedure Note  Procedure: Portacatheter removal.   Complications: None  Estimated Blood Loss: None  Recommendations: DC home Routine wound care  Signed,  Criselda Peaches, MD

## 2017-04-23 NOTE — Discharge Instructions (Addendum)
Implanted Port Removal, Care After °Refer to this sheet in the next few weeks. These instructions provide you with information about caring for yourself after your procedure. Your health care provider may also give you more specific instructions. Your treatment has been planned according to current medical practices, but problems sometimes occur. Call your health care provider if you have any problems or questions after your procedure. °What can I expect after the procedure? °After the procedure, it is common to have: °· Soreness or pain near your incision. °· Some swelling or bruising near your incision. ° °Follow these instructions at home: °Medicines °· Take over-the-counter and prescription medicines only as told by your health care provider. °· If you were prescribed an antibiotic medicine, take it as told by your health care provider. Do not stop taking the antibiotic even if you start to feel better. °Bathing °· Do not take baths, swim, or use a hot tub until your health care provider approves. Ask your health care provider if you can take showers. You may only be allowed to take sponge baths for bathing. °Incision care °· Follow instructions from your health care provider about how to take care of your incision. Make sure you: °? Wash your hands with soap and water before you change your bandage (dressing). If soap and water are not available, use hand sanitizer. °? Change your dressing as told by your health care provider. °? Keep your dressing dry. °? Leave stitches (sutures), skin glue, or adhesive strips in place. These skin closures may need to stay in place for 2 weeks or longer. If adhesive strip edges start to loosen and curl up, you may trim the loose edges. Do not remove adhesive strips completely unless your health care provider tells you to do that. °· Check your incision area every day for signs of infection. Check for: °? More redness, swelling, or pain. °? More fluid or  blood. °? Warmth. °? Pus or a bad smell. °Driving °· If you received a sedative, do not drive for 24 hours after the procedure. °· If you did not receive a sedative, ask your health care provider when it is safe to drive. °Activity °· Return to your normal activities as told by your health care provider. Ask your health care provider what activities are safe for you. °· Until your health care provider says it is safe: °? Do not lift anything that is heavier than 10 lb (4.5 kg). °? Do not do activities that involve lifting your arms over your head. °General instructions °· Do not use any tobacco products, such as cigarettes, chewing tobacco, and e-cigarettes. Tobacco can delay healing. If you need help quitting, ask your health care provider. °· Keep all follow-up visits as told by your health care provider. This is important. °Contact a health care provider if: °· You have more redness, swelling, or pain around your incision. °· You have more fluid or blood coming from your incision. °· Your incision feels warm to the touch. °· You have pus or a bad smell coming from your incision. °· You have a fever. °· You have pain that is not relieved by your pain medicine. °Get help right away if: °· You have chest pain. °· You have difficulty breathing. °This information is not intended to replace advice given to you by your health care provider. Make sure you discuss any questions you have with your health care provider. °Document Released: 05/02/2015 Document Revised: 10/27/2015 Document Reviewed: 02/23/2015 °Elsevier Interactive Patient   Education © 2018 [employer] °Moderate Conscious Sedation, Adult, Care After °These instructions provide you with information about caring for yourself after your procedure. Your health care provider may also give you more specific instructions. Your treatment has been planned according to current medical practices, but problems sometimes occur. Call your health care provider if you have  any problems or questions after your procedure. °What can I expect after the procedure? °After your procedure, it is common: °· To feel sleepy for several hours. °· To feel clumsy and have poor balance for several hours. °· To have poor judgment for several hours. °· To vomit if you eat too soon. ° °Follow these instructions at home: °For at least 24 hours after the procedure: ° °· Do not: °? Participate in activities where you could fall or become injured. °? Drive. °? Use heavy machinery. °? Drink alcohol. °? Take sleeping pills or medicines that cause drowsiness. °? Make important decisions or sign legal documents. °? Take care of children on your own. °· Rest. °Eating and drinking °· Follow the diet recommended by your health care provider. °· If you vomit: °? Drink water, juice, or soup when you can drink without vomiting. °? Make sure you have little or no nausea before eating solid foods. °General instructions °· Have a responsible adult stay with you until you are awake and alert. °· Take over-the-counter and prescription medicines only as told by your health care provider. °· If you smoke, do not smoke without supervision. °· Keep all follow-up visits as told by your health care provider. This is important. °Contact a health care provider if: °· You keep feeling nauseous or you keep vomiting. °· You feel light-headed. °· You develop a rash. °· You have a fever. °Get help right away if: °· You have trouble breathing. °This information is not intended to replace advice given to you by your health care provider. Make sure you discuss any questions you have with your health care provider. °Document Released: 03/11/2013 Document Revised: 10/24/2015 Document Reviewed: 09/10/2015 °Elsevier Interactive Patient Education © 2018 [employer] ° °

## 2017-04-23 NOTE — H&P (Signed)
Referring Physician(s): Brunetta Genera  Supervising Physician: Jacqulynn Cadet  Patient Status:  [facility]  Chief Complaint:  "I'm here to have my port taken out"  Subjective: Pt familiar to IR service from prior Port-A-Cath placement on 10/04/16.  She has a history of Hodgkin's lymphoma and is status post chemotherapy, currently in remission.  She presents today for Port-A-Cath removal.  She currently denies fever, headache, chest pain, dyspnea, cough, abdominal/back pain, nausea, vomiting or bleeding.  Past Medical History:  Diagnosis Date  . Cancer [facility])    lymphoma    Past Surgical History:  Procedure Laterality Date  . ANTERIOR CRUCIATE LIGAMENT REPAIR     left  . HERNIA REPAIR     left  . IR CV LINE INJECTION  03/05/2017  . IR FLUORO GUIDE PORT INSERTION RIGHT  10/04/2016  . IR US GUIDE VASC ACCESS RIGHT  10/04/2016  . TONSILLECTOMY      Allergies: Shellfish-derived products  Medications: Prior to Admission medications   Medication Sig Start Date End Date Taking? Authorizing Provider  albuterol (PROVENTIL HFA;VENTOLIN HFA) 108 (90 Base) MCG/ACT inhaler Inhale 2 puffs into the lungs every 6 (six) hours as needed for wheezing or shortness of breath. 10/03/16  Yes Brunetta Genera, MD  DULoxetine (CYMBALTA) 30 MG capsule Take 2 capsules (60 mg total) by mouth daily. 02/01/17  Yes Brunetta Genera, MD  loratadine (CLARITIN) 10 MG tablet Take 10 mg by mouth daily.   Yes [provider]  Ascorbic Acid (VITAMIN C PO) Take by mouth.    [provider]  Cyanocobalamin (VITAMIN B-12 CR PO) Take 1 tablet by mouth daily.    [provider]  dexamethasone (DECADRON) 4 MG tablet Take 2 tablets by mouth once a day on the day after chemotherapy and then take 2 tablets two times a day for 2 days. Take with food. Patient not taking: Reported on 04/05/2017 12/07/16   Brunetta Genera, MD  diphenhydramine-acetaminophen (TYLENOL PM) 25-500 MG TABS  tablet Take 2 tablets by mouth at bedtime as needed.    [provider]  EPINEPHrine 0.3 mg/0.3 mL IJ SOAJ injection Inject 0.3 mLs (0.3 mg total) into the muscle once. For anaphylactic reactions 10/03/16 01/16/17  Brunetta Genera, MD  HYDROcodone-acetaminophen (NORCO/VICODIN) 5-325 MG tablet Take 1-2 tablets by mouth every 6 (six) hours as needed for moderate pain or severe pain. Patient not taking: Reported on 04/05/2017 03/19/17   Brunetta Genera, MD  lidocaine-prilocaine (EMLA) cream Apply to affected area once Patient not taking: Reported on 04/05/2017 12/19/16   Brunetta Genera, MD  LORazepam (ATIVAN) 0.5 MG tablet Take 1 tablet (0.5 mg total) by mouth every 6 (six) hours as needed (Nausea or vomiting). Patient not taking: Reported on 04/05/2017 11/22/16   Brunetta Genera, MD  LORazepam (ATIVAN) 0.5 MG tablet Take 1 tablet (0.5 mg total) by mouth every 6 (six) hours as needed (Nausea or vomiting). Patient not taking: Reported on 04/05/2017 11/22/16   Brunetta Genera, MD  magic mouthwash w/lidocaine SOLN Take 5 mLs by mouth 4 (four) times daily as needed for mouth pain. Patient not taking: Reported on 04/05/2017 12/19/16   Brunetta Genera, MD  ondansetron (ZOFRAN) 8 MG tablet Take 1 tablet (8 mg total) by mouth 2 (two) times daily as needed. Start on the third day after chemotherapy. Patient not taking: Reported on 04/05/2017 10/12/16   Brunetta Genera, MD  pantoprazole (PROTONIX) 40 MG tablet Take 1 tablet (  40 mg total) by mouth daily. 02/01/17   Brunetta Genera, MD  PEGFILGRASTIM [place] Number Two Apply 1 patch topically once.    [provider]  prochlorperazine (COMPAZINE) 10 MG tablet Take 1 tablet (10 mg total) by mouth every 6 (six) hours as needed (Nausea or vomiting). Patient not taking: Reported on 04/05/2017 11/22/16   Brunetta Genera, MD  senna-docusate (SENNA S) 8.6-50 MG tablet Take 2 tablets by mouth at bedtime. Hold if diarrhea ensues Patient not  taking: Reported on 04/05/2017 01/18/17   Brunetta Genera, MD  sucralfate (CARAFATE) 1 GM/10ML suspension Take 10 mLs (1 g total) by mouth 4 (four) times daily -  with meals and at bedtime. 12/19/16   Brunetta Genera, MD     Vital Signs: BP (!) 135/94 (BP Location: Right Arm)   Pulse 74   Temp 97.8 F (36.6 C) (Oral)   Resp 16   LMP 11/02/2016 (Within Weeks)   SpO2 100%   Physical Exam awake, alert.  Chest clear to auscultation bilaterally.  Clean, intact right chest wall Port-A-Cath.  Heart with regular rate and rhythm.  Abdomen soft, positive bowel sounds, nontender.  No lower extremity edema.  Imaging: No results found.  Labs:  CBC: Recent Labs    02/15/17 1011 03/01/17 1033 03/19/17 1010 04/05/17 0801  WBC 7.6 6.5 9.3 8.8  HGB 12.0 11.7 12.6 12.6  HCT 36.1 35.4 37.5 38.0  PLT 329 376 291 263    COAGS: Recent Labs    10/04/16 0747  INR 1.13    BMP: Recent Labs    11/02/16 1030  12/31/16 0300  02/15/17 1011 03/01/17 1034 03/19/17 1010 04/05/17 0801  NA 134*   < > 134*   < > 140 140 141 140  K 4.2   < > 3.3*   < > 3.7 3.7 3.8 4.2  CL 105  --  99*  --   --   --   --   --   CO2 25   < > 25   < > 26 28 28 26   GLUCOSE 94   < > 133*   < > 107 92 74 95  BUN 6   < > <5*   < > 2.9* 3.6* 3.2* 6.3*  CALCIUM 8.4*   < > 9.6   < > 9.6 9.5 9.7 9.4  CREATININE 0.58   < > 0.52   < > 0.7 0.7 0.7 0.7  GFRNONAA >60  --  >60  --   --   --   --   --   GFRAA >60  --  >60  --   --   --   --   --    < > = values in this interval not displayed.    LIVER FUNCTION TESTS: Recent Labs    02/15/17 1011 03/01/17 1034 03/19/17 1010 04/05/17 0801  BILITOT <0.22 0.22 <0.22 <0.22  AST 22 20 17 17   ALT 15 12 13 11   ALKPHOS 70 76 72 64  PROT 6.7 6.9 7.2 6.9  ALBUMIN 3.4* 3.7 3.6 3.4*    Assessment and Plan: Patient with history of Hodgkin's lymphoma, status post chemotherapy, currently in remission.  She presents today for Port-A-Cath removal.  Details/risk of  procedure, including but not limited to, internal bleeding, infection, injury to adjacent structures discussed with patient/mother with their understanding and consent. Labs pend.   Electronically Signed: D. Rowe Robert, PA-C 04/23/2017, 12:10 PM   I spent a total  of  20 minutes at the the patient's bedside AND on the patient's hospital floor or unit, greater than 50% of which was counseling/coordinating care for port a cath removal

## 2017-06-07 ENCOUNTER — Telehealth: Payer: Self-pay | Admitting: Hematology

## 2017-06-07 ENCOUNTER — Other Ambulatory Visit (HOSPITAL_BASED_OUTPATIENT_CLINIC_OR_DEPARTMENT_OTHER): Payer: Self-pay

## 2017-06-07 ENCOUNTER — Ambulatory Visit: Payer: Self-pay | Admitting: Hematology

## 2017-06-07 DIAGNOSIS — C8198 Hodgkin lymphoma, unspecified, lymph nodes of multiple sites: Secondary | ICD-10-CM

## 2017-06-07 LAB — COMPREHENSIVE METABOLIC PANEL
ALT: 18 U/L (ref 0–55)
ANION GAP: 6 meq/L (ref 3–11)
AST: 18 U/L (ref 5–34)
Albumin: 3.6 g/dL (ref 3.5–5.0)
Alkaline Phosphatase: 85 U/L (ref 40–150)
BILIRUBIN TOTAL: 0.25 mg/dL (ref 0.20–1.20)
BUN: 5.8 mg/dL — ABNORMAL LOW (ref 7.0–26.0)
CALCIUM: 9.8 mg/dL (ref 8.4–10.4)
CO2: 27 mEq/L (ref 22–29)
Chloride: 105 mEq/L (ref 98–109)
Creatinine: 0.7 mg/dL (ref 0.6–1.1)
Glucose: 95 mg/dl (ref 70–140)
Potassium: 4.1 mEq/L (ref 3.5–5.1)
Sodium: 138 mEq/L (ref 136–145)
TOTAL PROTEIN: 7 g/dL (ref 6.4–8.3)

## 2017-06-07 LAB — CBC & DIFF AND RETIC
BASO%: 0.5 % (ref 0.0–2.0)
Basophils Absolute: 0 10*3/uL (ref 0.0–0.1)
EOS%: 8.2 % — AB (ref 0.0–7.0)
Eosinophils Absolute: 0.3 10*3/uL (ref 0.0–0.5)
HCT: 40.6 % (ref 34.8–46.6)
HGB: 14 g/dL (ref 11.6–15.9)
Immature Retic Fract: 2.3 % (ref 1.60–10.00)
LYMPH#: 1.1 10*3/uL (ref 0.9–3.3)
LYMPH%: 29.2 % (ref 14.0–49.7)
MCH: 31.7 pg (ref 25.1–34.0)
MCHC: 34.5 g/dL (ref 31.5–36.0)
MCV: 91.9 fL (ref 79.5–101.0)
MONO#: 0.3 10*3/uL (ref 0.1–0.9)
MONO%: 8.4 % (ref 0.0–14.0)
NEUT#: 2 10*3/uL (ref 1.5–6.5)
NEUT%: 53.7 % (ref 38.4–76.8)
PLATELETS: 225 10*3/uL (ref 145–400)
RBC: 4.42 10*6/uL (ref 3.70–5.45)
RDW: 13.5 % (ref 11.2–14.5)
RETIC %: 1 % (ref 0.70–2.10)
RETIC CT ABS: 44.2 10*3/uL (ref 33.70–90.70)
WBC: 3.7 10*3/uL — AB (ref 3.9–10.3)

## 2017-06-07 NOTE — Progress Notes (Incomplete)
HEMATOLOGY/ONCOLOGY CLINIC NOTE  Date of Service: 06/07/17  Patient Care Team: Brunetta Genera, MD as PCP - General (Hematology) Patient, No Pcp Per (General Practice)  CHIEF COMPLAINTS/PURPOSE OF CONSULTATION:  F/u classical Hodgkin's lymphoma  HISTORY OF PRESENTING ILLNESS:   Leah Floyd is a wonderful 27 y.o. female who has been referred to [place] by [employer] [facility] for evaluation and management of newly diagnosed classical Hodgkin's lymphoma.  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  She notes that in November 2017 she developed generalized body aches and flulike symptoms with some subjective fevers chills and night sweats. A primary care physician noted a right axillary lump and she was treated empirically with Z-Pak for possible infection. She notes that around Christmas time in 2017 she developed worsening fatigue and generalized body aches a point that she was having difficulties getting through the day. She notes that she had an autoimmune workup HIV testing which was unrevealing she was noted to have some iron deficiency. She notes that she had a follow-up ultrasound that showed right axillary lymph node was enlarging and she also had a left axillary lymph node and had noted inguinal lymph nodes in the groin. Some of her workup was delayed due to insurance issues that she eventually had a left axillary lymph node biopsy on 09/24/2016 accession number [id number] which showed classical Hodgkin's lymphoma.  Patient then has moved back from [place] to be with her mother and family in [place] not, and is here to seek further cares for her classical Hodgkin's lymphoma newly diagnosed.  Patient notes significant fatigue, night sweats, weight loss of about 40 pounds in the  last 4-5 months, generalized body aches, dry cough and loss of appetite.  She notes that she previously had heavy menstrual periods lasting 7 days which are now become lighter and last about 4 days. She has been taking ferrous sulfate plus vitamin C for her iron deficiency anemia.  No previous heart problems. Has been having some mild diarrhea which she describes as soft stools 2-3 times a day.  INTERVAL HISTORY  Leah Floyd is here for a followup following completion of chemotherapy. She had her port removed on 04/23/2017. She states that she is doing well overall. ***  On review of systems, pt reports ***  {She reports that she is feeling better physically,  emotionally and spiritually following completion of treatment. She is still currently taking Cymbalta. She is excited to be completing school this December, 2018 and notes that she has to complete her portfolio. She reports that she hasn't been to Physical Therapy, due to being d/c after improving so well since her first appointment. She was given references for other forms of exercise and physical therapy to aid with strength training and she plans to complete exercises. She reports that following treatment, she would like to move to the [facility]. She will be going to [place] for Christmas with her entire family. She has been walking more in order to build up her stamina.   She voices that she would like to complete a Juice Cleanse and voices if she would be able to do so. She voices concern for skin care aid, due to following treatment her skin is now dryer. She doesn't take hot showers due to increased hot flashes. 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 She notes that she has completed 5 blogs throughout her treatment to aid with getting through her treatments. She has gotten positive response through her blogs.   PET/CT results were reviewed in details with the patient and shows no disease > deauville 2.  On review of  systems, she reports neuropathy to her bilateral fingers resembling tingling sensation. She reports that she has not had a menstrual cycle, but has had bloating and similar pre-menstrual cycle symptoms. She reports that she is starting to obtain taste again. She reports myalgias for 2 days out of the week. She denies decreased energy levels. Denies abdominal pain or bowel movement issues. Denies neuropathy to BLE. She reports resolved swelling to her BLE. }    MEDICAL HISTORY:   #1 iron deficiency anemia #2 GERD #3 obesity. #4 pseudotumor cerebri however this has resolved with her weight loss as per patient. #5 asthma #6 of newly diagnosed Hodgkin's lymphoma #7 shellfish allergy #8 history of Chlamydia infection   SURGICAL HISTORY: Past Surgical History:  Procedure Laterality Date   ANTERIOR CRUCIATE LIGAMENT REPAIR     left   HERNIA REPAIR     left   IR CV LINE INJECTION  03/05/2017   IR FLUORO GUIDE PORT INSERTION RIGHT  10/04/2016   IR REMOVAL TUN ACCESS W/ PORT W/O FL MOD SED  04/23/2017   IR US GUIDE VASC ACCESS RIGHT  10/04/2016   TONSILLECTOMY      SOCIAL HISTORY: Social History   Socioeconomic History   Marital status: Single    Spouse name: Not on file   Number of children: Not on file   Years of education: Not on file   Highest education level: Not on file  Social Needs   Financial resource strain: Not on file   Food insecurity - worry: Not on file   Food insecurity - inability: Not on file   Transportation needs - medical: Not on file   Transportation needs - non-medical: Not on file  Occupational History   Not on file  Tobacco Use   Smoking status: Never Smoker   Smokeless tobacco: Never Used  Substance and Sexual Activity   Alcohol use: Yes    Comment: socially   Drug use: Yes    Types: Marijuana    Comment: medical    Sexual activity: Not on file  Other Topics Concern   Not on file  Social History Narrative   Not on file     FAMILY HISTORY: Notes her first cousin on her mom's side had some form off rare bone marrow cancer Does not know history on her father's side of the family Maternal grandmother-hypertension, diabetes, fibromyalgia Mother anemia Other relatives on her mom's side with lymphoma and bladder cancer.  ALLERGIES:  is allergic to shellfish-derived products.  MEDICATIONS:  Current Outpatient Medications  Medication Sig Dispense Refill   albuterol (PROVENTIL HFA;VENTOLIN HFA) 108 (90 Base) MCG/ACT inhaler Inhale 2 puffs into the lungs every 6 (six) hours as needed for wheezing or shortness of breath. 1 Inhaler 2   Ascorbic Acid (VITAMIN C PO) Take by mouth.     Cyanocobalamin (VITAMIN B-12 CR PO) Take 1 tablet by mouth daily.     dexamethasone (DECADRON) 4 MG tablet Take 2 tablets by mouth once a day on the day after chemotherapy and then take 2 tablets two times a day for 2 days. Take with food. (Patient not taking: Reported on 04/05/2017) 30 tablet 1  diphenhydramine-acetaminophen (TYLENOL PM) 25-500 MG TABS tablet Take 2 tablets by mouth at bedtime as needed.     DULoxetine (CYMBALTA) 30 MG capsule Take 2 capsules (60 mg total) by mouth daily. 60 capsule 1   EPINEPHrine 0.3 mg/0.3 mL IJ SOAJ injection Inject 0.3 mLs (0.3 mg total) into the muscle once. For anaphylactic reactions 1 Device 0   HYDROcodone-acetaminophen (NORCO/VICODIN) 5-325 MG tablet Take 1-2 tablets by mouth every 6 (six) hours as needed for moderate pain or severe pain. (Patient not taking: Reported on 04/05/2017) 30 tablet 0   lidocaine-prilocaine (EMLA) cream Apply to affected area once (Patient not taking: Reported on 04/05/2017) 30 g 3   loratadine (CLARITIN) 10 MG tablet Take 10 mg by mouth daily.     LORazepam (ATIVAN) 0.5 MG tablet Take 1 tablet (0.5 mg total) by mouth every 6 (six) hours as needed (Nausea or vomiting). (Patient not taking: Reported on 04/05/2017) 30 tablet 0   LORazepam (ATIVAN) 0.5 MG tablet  Take 1 tablet (0.5 mg total) by mouth every 6 (six) hours as needed (Nausea or vomiting). (Patient not taking: Reported on 04/05/2017) 30 tablet 0   magic mouthwash w/lidocaine SOLN Take 5 mLs by mouth 4 (four) times daily as needed for mouth pain. (Patient not taking: Reported on 04/05/2017) 120 mL 0   ondansetron (ZOFRAN) 8 MG tablet Take 1 tablet (8 mg total) by mouth 2 (two) times daily as needed. Start on the third day after chemotherapy. (Patient not taking: Reported on 04/05/2017) 30 tablet 1   pantoprazole (PROTONIX) 40 MG tablet Take 1 tablet (40 mg total) by mouth daily. 30 tablet 1   PEGFILGRASTIM  Apply 1 patch topically once.     prochlorperazine (COMPAZINE) 10 MG tablet Take 1 tablet (10 mg total) by mouth every 6 (six) hours as needed (Nausea or vomiting). (Patient not taking: Reported on 04/05/2017) 30 tablet 1   senna-docusate (SENNA S) 8.6-50 MG tablet Take 2 tablets by mouth at bedtime. Hold if diarrhea ensues (Patient not taking: Reported on 04/05/2017) 60 tablet 1   sucralfate (CARAFATE) 1 GM/10ML suspension Take 10 mLs (1 g total) by mouth 4 (four) times daily -  with meals and at bedtime. 420 mL 0   No current facility-administered medications for this visit.    Facility-Administered Medications Ordered in Other Visits  Medication Dose Route Frequency Provider Last Rate Last Dose   albuterol (PROVENTIL) (2.5 MG/3ML) 0.083% nebulizer solution 2.5 mg  2.5 mg Nebulization Once Brunetta Genera, MD       brentuximab vedotin (ADCETRIS) 65 mg in sodium chloride 0.9 % 100 mL chemo infusion  0.9 mg/kg (Treatment Plan Recorded) Intravenous Once Brunetta Genera, MD       dacarbazine (DTIC) 660 mg in sodium chloride 0.9 % 250 mL chemo infusion  375 mg/m2 (Treatment Plan Recorded) Intravenous Once Brunetta Genera, MD       heparin lock flush 100 unit/mL  500 Units Intracatheter Once PRN Brunetta Genera, MD       pegfilgrastim (NEULASTA ONPRO KIT) injection 6 mg   6 mg Subcutaneous Once Brunetta Genera, MD       sodium chloride flush (NS) 0.9 % injection 10 mL  10 mL Intracatheter PRN Brunetta Genera, MD       vinBLAStine (VELBAN) 10 mg in sodium chloride 0.9 % 50 mL chemo infusion  5.6 mg/m2 (Treatment Plan Recorded) Intravenous Once Brunetta Genera, MD        REVIEW  OF SYSTEMS:   A 10+ POINT REVIEW OF SYSTEMS WAS OBTAINED including neurology, dermatology, psychiatry, cardiac, respiratory, lymph, extremities, GI, GU, Musculoskeletal, constitutional, breasts, reproductive, HEENT.  All pertinent positives are noted in the HPI.  All others are negative.  PHYSICAL EXAMINATION:  ECOG PERFORMANCE STATUS: 1 - Symptomatic but completely ambulatory  . There were no vitals filed for this visit. There were no vitals filed for this visit. .There is no height or weight on file to calculate BMI.  GENERAL:alert, in no acute distress and comfortable SKIN: no acute rashes, no significant lesions. Site on right forearm from previous infiltration shows no change in ulceration, change in pigmentation, and no other major changes noted.  EYES: conjunctiva are pink and non-injected, sclera anicteric OROPHARYNX: MMM, no exudates, no oropharyngeal erythema or ulceration. No thrush.  NECK: supple, no JVD LYMPH: resolved lymphadenopathy previously present in bilaterally in her neck , occipital area, supraclavicular, bilateral axillary and inguinal areas  LUNGS: clear to auscultation b/l with normal respiratory effort HEART: regular rate & rhythm ABDOMEN:  normoactive bowel sounds , non tender, not distended. Borderline palpable splenomegaly, no palpable hepatomegaly. Extremity: no pedal edema PSYCH: alert & oriented x 3 with fluent speech NEURO: no focal motor/sensory deficits    LABORATORY DATA:  I have reviewed the data as listed  . CBC Latest Ref Rng & Units 04/23/2017 04/05/2017 03/19/2017  WBC 4.0 - 10.5 K/uL 4.9 8.8 9.3  Hemoglobin 12.0 -  15.0 g/dL 13.0 12.6 12.6  Hematocrit 36.0 - 46.0 % 37.6 38.0 37.5  Platelets 150 - 400 K/uL 264 263 291   ANC 5k . CBC    Component Value Date/Time   WBC 4.9 04/23/2017 1219   RBC 4.09 04/23/2017 1219   HGB 13.0 04/23/2017 1219   HGB 12.6 04/05/2017 0801   HCT 37.6 04/23/2017 1219   HCT 38.0 04/05/2017 0801   PLT 264 04/23/2017 1219   PLT 263 04/05/2017 0801   MCV 91.9 04/23/2017 1219   MCV 93.1 04/05/2017 0801   MCH 31.8 04/23/2017 1219   MCHC 34.6 04/23/2017 1219   RDW 15.2 04/23/2017 1219   RDW 15.4 (H) 04/05/2017 0801   LYMPHSABS 1.4 04/05/2017 0801   MONOABS 0.9 04/05/2017 0801   EOSABS 0.5 04/05/2017 0801   BASOSABS 0.1 04/05/2017 0801    . CMP Latest Ref Rng & Units 04/05/2017 03/19/2017 03/01/2017  Glucose 70 - 140 mg/dl 95 74 92  BUN 7.0 - 26.0 mg/dL 6.3(L) 3.2(L) 3.6(L)  Creatinine 0.6 - 1.1 mg/dL 0.7 0.7 0.7  Sodium 136 - 145 mEq/L 140 141 140  Potassium 3.5 - 5.1 mEq/L 4.2 3.8 3.7  Chloride 101 - 111 mmol/L - - -  CO2 22 - 29 mEq/L _0 Calcium 8.4 - 10.4 mg/dL 9.4 9.7 9.5  Total Protein 6.4 - 8.3 g/dL 6.9 7.2 6.9  Total Bilirubin 0.20 - 1.20 mg/dL <0.22 <0.22 0.22  Alkaline Phos 40 - 150 U/L 64 72 76  AST 5 - 34 U/L _1 ALT 0 - 55 U/L _2 .  RADIOGRAPHIC STUDIES: I have personally reviewed the radiological images as listed and agreed with the findings in the report. No results found.   NM PET Image Restag Skull Base to Thigh, 01/02/2017 IMPRESSION: 1. Marked reduction in size and metabolic activity of axillary adenopathy, mediastinal adenopathy, and periaortic retroperitoneal adenopathy. Activity of these nodes is similar to mediastinal activity and less than liver activity ( Deauville 2). 2.  Decreased in size and metabolic activity of bilateral iliac lymphadenopathy with enlarged lymph nodes remaining ( Deauville 2). 3. Most intense activity remains within LEFT inguinal lymph node with activity above liver activity Deauville 4.  However, this lymph node was not metabolic on comparison examination and is normal size. Potential reactive node. 4. Increase in marrow activity is favored GCSF type response. 5. Mild metabolic activity associated ground-glass opacities in the upper lobes is favored infectious or inflammatory. Correlate with drug reaction / toxicity.   ASSESSMENT & PLAN:   27 year old female with   #1 Classical Hodgkin's lymphoma Stage IVB as per PET/CT Patient had constitutional symptoms with significant weight loss of 40 pounds, and night sweats and debilitating fatigue which has now resolved  PET/CT after 3 cycles of treatment with excellent response to treatment thus far.  PET scan on 04/03/2017 after 6 cycles of completion of treatment showed no disease greater than Deauville 2.   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  PLAN  -patient has no clinical, lab or radiographic evidence of disease progression at this time. -labs are stable  -follow up in 2 months with labs . She understands that she had high risk disease and will need to be monitored closely. -follow up in the next week for port removal.  -continue on Cymbalta 36m po daily and norco prn for pain from neuropathy  -continue with exercise program, [employer]. She has completed planned followed up with [facility] and has done well and graduated.     #4 Grade 1 oral mucositis and oral thrush. Now resolved status post use of nystatin.  #3 history of iron deficiency anemia Has been on oral iron that causes significant constipation and is still significantly anemic. S/p IV Injectafer 7530mx 1 dose  hgb has improved. Ferritin adequate.  Lab Results  Component Value Date   FERRITIN 235 01/18/2017   plan  - No indication for additional iron  replacement at this time  #4 history of asthma. 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 Patient notes that she tolerates eating shrimp. -Previously prescribed EpiPen for when necessary use. -Previously recommended also to carry Benadryl.  #5 general medical cares -reminded again to get a PCP  -Previously advised that she would be able to travel in November if she is feeling up to it.    RTC with Dr. KaIrene Limbon 2 months with labs Port removal in 1 week     GaSullivan LoneD Leah AAHIVMS SCPost Acute Medical Specialty Hospital Of MilwaukeeTForest Health Medical Center Of Bucks Countyematology/Oncology Physician [place](Office):       [phone] cell):  [phone]):           [phone] document serves as a record of services personally performed by GaSullivan LoneMD. It was created on his behalf by LaAlean Rinnea trained medical scribe. The creation of this record is based on the scribe's personal observations and the provider's statements to them. This document has been checked and approved by the attending provider.  .I have reviewed the above documentation for accuracy and completeness, and I agree with the above. .GBrunetta GeneraD

## 2017-06-07 NOTE — Telephone Encounter (Signed)
Patient made a new appointment had to leave

## 2017-06-08 LAB — SEDIMENTATION RATE: SED RATE: 22 mm/h (ref 0–32)

## 2017-06-14 NOTE — Progress Notes (Signed)
HEMATOLOGY/ONCOLOGY CLINIC NOTE  Date of Service: 06/17/17  Patient Care Team: Brunetta Genera, MD as PCP - General (Hematology) Patient, No Pcp Per (General Practice)  CHIEF COMPLAINTS/PURPOSE OF CONSULTATION:  F/u classical Hodgkin's lymphoma  HISTORY OF PRESENTING ILLNESS:   Leah Floyd is a wonderful 27 y.o. female who has been referred to [place] by [facility] [place] for evaluation and management of newly diagnosed classical Hodgkin's lymphoma.  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  She notes that in November 2017 she developed generalized body aches and flulike symptoms with some subjective fevers chills and night sweats. A primary care physician noted a right axillary lump and she was treated empirically with Z-Pak for possible infection. She notes that around Christmas time in 2017 she developed worsening fatigue and generalized body aches a point that she was having difficulties getting through the day. She notes that she had an autoimmune workup HIV testing which was unrevealing she was noted to have some iron deficiency. She notes that she had a follow-up ultrasound that showed right axillary lymph node was enlarging and she also had a left axillary lymph node and had noted inguinal lymph nodes in the groin. Some of her workup was delayed due to insurance issues that she eventually had a left axillary lymph node biopsy on 09/24/2016 accession number [id number] which showed classical Hodgkin's lymphoma.  Patient then has moved back from [place] to be with her mother and family in [place] not, and is here to seek further cares for her classical Hodgkin's lymphoma newly diagnosed.  Patient notes significant fatigue, night sweats, weight loss of about 40 pounds in the  last 4-5 months, generalized body aches, dry cough and loss of appetite.  She notes that she previously had heavy menstrual periods lasting 7 days which are now become lighter and last about 4 days. She has been taking ferrous sulfate plus vitamin C for her iron deficiency anemia.  No previous heart problems. Has been having some mild diarrhea which she describes as soft stools 2-3 times a day.  INTERVAL HISTORY  Leah Floyd is here for a followup following completion of 6 cycles of A-AVD chemotherapy. She enjoyed going to [place] [employer] with her family and she is happy that she was able to do so. 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 She will not obtain benefits because it is a part time position, but she is working on a full time position. She discontinued use of Cymbalta and she is doing well without the medication. She is emotionally in a better place at this time. She does have a PCP at [facility]. She hasn't obtained her flu vaccination this year. She reports that she hasn't gotten her menstrual cycle as of yet.   On review of systems, she reports night sweats, weight gain, regained taste, hair growth, improving neuropathy, joint pain that last for approximately 1 minute and then resolves. She reports mild leg swelling. She denies fever, chills, and any other symptoms.        MEDICAL HISTORY:   #1 iron deficiency anemia #2 GERD #3 obesity. #4 pseudotumor cerebri however this has resolved with her weight loss as per patient. #5 asthma #6 of  newly diagnosed Hodgkin's lymphoma #7 shellfish allergy #8 history of Chlamydia infection   SURGICAL HISTORY: Past Surgical History:  Procedure Laterality Date  . ANTERIOR CRUCIATE LIGAMENT REPAIR     left  . HERNIA REPAIR     left  . IR CV LINE INJECTION  03/05/2017  . IR FLUORO GUIDE PORT INSERTION RIGHT  10/04/2016  . IR REMOVAL TUN ACCESS W/  PORT W/O FL MOD SED  04/23/2017  . IR US GUIDE VASC ACCESS RIGHT  10/04/2016  . TONSILLECTOMY      SOCIAL HISTORY: Social History   Socioeconomic History  . Marital status: Single    Spouse name: Not on file  . Number of children: Not on file  . Years of education: Not on file  . Highest education level: Not on file  Social Needs  . Financial resource strain: Not on file  . Food insecurity - worry: Not on file  . Food insecurity - inability: Not on file  . Transportation needs - medical: Not on file  . Transportation needs - non-medical: Not on file  Occupational History  . Not on file  Tobacco Use  . Smoking status: Never Smoker  . Smokeless tobacco: Never Used  Substance and Sexual Activity  . Alcohol use: Yes    Comment: socially  . Drug use: Yes    Types: Marijuana    Comment: medical   . Sexual activity: Not on file  Other Topics Concern  . Not on file  Social History Narrative  . Not on file    FAMILY HISTORY: Notes her first cousin on her mom's side had some form off rare bone marrow cancer Does not know history on her father's side of the family Maternal grandmother-hypertension, diabetes, fibromyalgia Mother anemia Other relatives on her mom's side with lymphoma and bladder cancer.  ALLERGIES:  is allergic to shellfish-derived products.  MEDICATIONS:  Current Outpatient Medications  Medication Sig Dispense Refill  . albuterol (PROVENTIL HFA;VENTOLIN HFA) 108 (90 Base) MCG/ACT inhaler Inhale 2 puffs into the lungs every 6 (six) hours as needed for wheezing or shortness of breath. 1 Inhaler 2  . Ascorbic Acid (VITAMIN C PO) Take by mouth.    . Cyanocobalamin (VITAMIN B-12 CR PO) Take 1 tablet by mouth daily.    . diphenhydramine-acetaminophen (TYLENOL PM) 25-500 MG TABS tablet Take 2 tablets by mouth at bedtime as needed.    . loratadine (CLARITIN) 10 MG tablet Take 10 mg by mouth daily.    [name] senna-docusate (SENNA S) 8.6-50 MG tablet Take 2 tablets by mouth  at bedtime. Hold if diarrhea ensues 60 tablet 1  . EPINEPHrine 0.3 mg/0.3 mL IJ SOAJ injection Inject 0.3 mLs (0.3 mg total) into the muscle once. For anaphylactic reactions 1 Device 0   No current facility-administered medications for this visit.    Facility-Administered Medications Ordered in Other Visits  Medication Dose Route Frequency Provider Last Rate Last Dose  . albuterol (PROVENTIL) (2.5 MG/3ML) 0.083% nebulizer solution 2.5 mg  2.5 mg Nebulization Once Brunetta Genera, MD      . brentuximab vedotin (ADCETRIS) 65 mg in sodium chloride 0.9 % 100 mL chemo infusion  0.9 mg/kg (Treatment Plan Recorded) Intravenous Once Brunetta Genera, MD      . dacarbazine (DTIC) 660 mg in sodium chloride 0.9 % 250 mL chemo infusion  375 mg/m2 (Treatment Plan Recorded) Intravenous Once Brunetta Genera, MD      . heparin lock flush 100 unit/mL  500 Units  Intracatheter Once PRN Brunetta Genera, MD      . pegfilgrastim (NEULASTA ONPRO KIT) injection 6 mg  6 mg Subcutaneous Once Brunetta Genera, MD      . sodium chloride flush (NS) 0.9 % injection 10 mL  10 mL Intracatheter PRN Brunetta Genera, MD      . vinBLAStine (VELBAN) 10 mg in sodium chloride 0.9 % 50 mL chemo infusion  5.6 mg/m2 (Treatment Plan Recorded) Intravenous Once Brunetta Genera, MD        REVIEW OF SYSTEMS:   A 10+ POINT REVIEW OF SYSTEMS WAS OBTAINED including neurology, dermatology, psychiatry, cardiac, respiratory, lymph, extremities, GI, GU, Musculoskeletal, constitutional, breasts, reproductive, HEENT.  All pertinent positives are noted in the HPI.  All others are negative.  PHYSICAL EXAMINATION:  ECOG PERFORMANCE STATUS: 1 - Symptomatic but completely ambulatory  . Vitals:   06/17/17 1354  BP: 119/79  Pulse: 79  Resp: 18  Temp: 98.7 F (37.1 C)  SpO2: 100%   Filed Weights   06/17/17 1354  Weight: 188 lb 9.6 oz (85.5 kg)   .Body mass index is 35.64 kg/m.  GENERAL:alert, in no acute  distress and comfortable SKIN: no acute rashes, no significant lesions. Site on right forearm from previous infiltration shows no change in ulceration, change in pigmentation, and no other major changes noted.  EYES: conjunctiva are pink and non-injected, sclera anicteric OROPHARYNX: MMM, no exudates, no oropharyngeal erythema or ulceration. No thrush.  NECK: supple, no JVD LYMPH: resolved lymphadenopathy previously present in bilaterally in her neck , occipital area, supraclavicular, bilateral axillary and inguinal areas  LUNGS: clear to auscultation b/l with normal respiratory effort HEART: regular rate & rhythm ABDOMEN:  normoactive bowel sounds , non tender, not distended. Borderline palpable splenomegaly, no palpable hepatomegaly. Extremity: no pedal edema PSYCH: alert & oriented x 3 with fluent speech NEURO: no focal motor/sensory deficits   LABORATORY DATA:  I have reviewed the data as listed  . CBC Latest Ref Rng & Units 06/07/2017 04/23/2017 04/05/2017  WBC 3.9 - 10.3 10e3/uL 3.7(L) 4.9 8.8  Hemoglobin 11.6 - 15.9 g/dL 14.0 13.0 12.6  Hematocrit 34.8 - 46.6 % 40.6 37.6 38.0  Platelets 145 - 400 10e3/uL 225 264 263   ANC 2k . CBC    Component Value Date/Time   WBC 3.7 (L) 06/07/2017 0916   WBC 4.9 04/23/2017 1219   RBC 4.42 06/07/2017 0916   RBC 4.09 04/23/2017 1219   HGB 14.0 06/07/2017 0916   HCT 40.6 06/07/2017 0916   PLT 225 06/07/2017 0916   MCV 91.9 06/07/2017 0916   MCH 31.7 06/07/2017 0916   MCH 31.8 04/23/2017 1219   MCHC 34.5 06/07/2017 0916   MCHC 34.6 04/23/2017 1219   RDW 13.5 06/07/2017 0916   LYMPHSABS 1.1 06/07/2017 0916   MONOABS 0.3 06/07/2017 0916   EOSABS 0.3 06/07/2017 0916   BASOSABS 0.0 06/07/2017 0916    . CMP Latest Ref Rng & Units 06/07/2017 04/05/2017 03/19/2017  Glucose 70 - 140 mg/dl 95 95 74  BUN 7.0 - 26.0 mg/dL 5.8(L) 6.3(L) 3.2(L)  Creatinine 0.6 - 1.1 mg/dL 0.7 0.7 0.7  Sodium 136 - 145 mEq/L 138 140 141  Potassium 3.5 - 5.1  mEq/L 4.1 4.2 3.8  Chloride 101 - 111 mmol/L - - -  CO2 22 - 29 mEq/L _0 Calcium 8.4 - 10.4 mg/dL 9.8 9.4 9.7  Total Protein 6.4 - 8.3 g/dL 7.0 6.9 7.2  Total Bilirubin 0.20 -  1.20 mg/dL 0.25 <0.22 <0.22  Alkaline Phos 40 - 150 U/L 85 64 72  AST 5 - 34 U/L _0 ALT 0 - 55 U/L _1 .  RADIOGRAPHIC STUDIES: I have personally reviewed the radiological images as listed and agreed with the findings in the report. No results found.   NM PET Image Restag Skull Base to Thigh, 04/03/2017 IMPRESSION: 1. Bilateral mild axillary adenopathy along with the mild left external iliac adenopathy persists, uptake is Deauville 2 and similar to minimally reduced compared to the prior exam. Similar Deauville 2 activity in the the remaining prevascular lymph nodes which are upper normal sized and indistinctly marginated. 2. The previously hypermetabolic left inguinal lymph node is no longer significantly hypermetabolic, and may have been reactive. 3. There is considerable activity in some of the muscular and fatty tissues of the neck and chest without CT correlate, likely due to a combination of brown fat distribution and physiologic activity. 4. Generalized mild accentuation of marrow activity, probably related to marrow/granulocyte stimulation. 5. Prior ground-glass opacities in the lungs have resolved.  NM PET Image Restag Skull Base to Thigh, 01/02/2017 IMPRESSION: 1. Marked reduction in size and metabolic activity of axillary adenopathy, mediastinal adenopathy, and periaortic retroperitoneal adenopathy. Activity of these nodes is similar to mediastinal activity and less than liver activity ( Deauville 2). 2. Decreased in size and metabolic activity of bilateral iliac lymphadenopathy with enlarged lymph nodes remaining ( Deauville 2). 3. Most intense activity remains within LEFT inguinal lymph node with activity above liver activity Deauville 4. However, this lymph node was not  metabolic on comparison examination and is normal size. Potential reactive node. 4. Increase in marrow activity is favored GCSF type response. 5. Mild metabolic activity associated ground-glass opacities in the upper lobes is favored infectious or inflammatory. Correlate with drug reaction / toxicity.  ASSESSMENT & PLAN:   27 year old female with   #1 Classical Hodgkin's lymphoma Stage IVB as per PET/CT Patient had constitutional symptoms with significant weight loss of 40 pounds, and night sweats and debilitating fatigue which has now resolved  PET/CT after 3 cycles of treatment with excellent response to treatment thus far.  PET scan on 04/03/2017 after 6 cycles of completion of treatment showed no disease greater than Deauville 2.  -Advised the patient to follow up at [facility] for further oncology needs.  -Advised the patient to follow up every 3 months for labs and lymphnode check -Advised and recommended with the patient that obtaining a flu vaccination would be beneficial at this point.  -discussed that the patient's menstrual cycle should return from 3-6 months following completion of treatment. Also advised that the patient obtain GYN follow up while in [place].    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  PLAN  -patient has no clinical, lab or radiographic evidence of lymphoma  progression at this time. -labs are stable . Nl sed rate -follow up in 2 months with labs . She understands that she had high risk disease and will need to be monitored closely. -neuropathy nearly resolved and patient has stopped Cymbalta -Previously advised to continue with exercise program, [employer]. She has completed planned followed up with [place] team and has done well and graduated.     #4 Grade 1  oral  mucositis and oral thrush. Now resolved status post use of nystatin.  #3 history of iron deficiency anemia Has been on oral iron that causes significant constipation and is still significantly anemic. S/p IV Injectafer 7102m x 1 dose  hgb has improved. Ferritin adequate as of 01/18/2017.   Lab Results  Component Value Date   FERRITIN 235 01/18/2017   plan  - No indication for additional iron replacement at this time  #4 history of asthma. This has been mild but [place] Plan -Previously advised the patient to continue albuterol inhaler on an as-needed basis  #4 history of shellfish allergies with anaphylaxis/angioedema. Patient notes that she tolerates eating shrimp. -Previously prescribed EpiPen for when necessary use. -Previously recommended also to carry Benadryl.  #5 general medical cares -reminded again to get a PCP  -Previously advised that she would be able to travel in November if she is feeling up to it.    RTC with Dr KIrene Limboas needed [place] [facility] followup q351monthwith labs for 2 yrs then q6m54monthrom 3-5 yrs out. Consider CT c/a/p at 6 months, 12 months and 24 months Received flu shot today Will need prevnar and PCV vacc[place] contact   GauSullivan Lone Leah HennesseyHIVMS SCHChi Health SchuylerHColonoscopy And Endoscopy Center LLCmatology/Oncology Physician ConLagunitas-Forest KnollsOffice):       [phone] cell):  [phone]):           [phone] document serves as a record of services personally performed by GauSullivan LoneD. It was created on his behalf by SoiSteva Colder trained medical scribe. The creation of this record is based on the scribe's personal observations and the provider's statements to them.   .I have reviewed the above documentation for accuracy and completeness, and I agree with the  above. .GaBrunetta Genera Leah

## 2017-06-17 ENCOUNTER — Inpatient Hospital Stay: Payer: Self-pay | Attending: Hematology | Admitting: Hematology

## 2017-06-17 ENCOUNTER — Encounter: Payer: Self-pay | Admitting: Hematology

## 2017-06-17 ENCOUNTER — Inpatient Hospital Stay: Payer: Self-pay

## 2017-06-17 VITALS — BP 119/79 | HR 79 | Temp 98.7°F | Resp 18 | Ht 61.0 in | Wt 188.6 lb

## 2017-06-17 DIAGNOSIS — D509 Iron deficiency anemia, unspecified: Secondary | ICD-10-CM | POA: Insufficient documentation

## 2017-06-17 DIAGNOSIS — K219 Gastro-esophageal reflux disease without esophagitis: Secondary | ICD-10-CM | POA: Insufficient documentation

## 2017-06-17 DIAGNOSIS — T451X5A Adverse effect of antineoplastic and immunosuppressive drugs, initial encounter: Secondary | ICD-10-CM

## 2017-06-17 DIAGNOSIS — C8198 Hodgkin lymphoma, unspecified, lymph nodes of multiple sites: Secondary | ICD-10-CM

## 2017-06-17 DIAGNOSIS — E669 Obesity, unspecified: Secondary | ICD-10-CM | POA: Insufficient documentation

## 2017-06-17 DIAGNOSIS — D500 Iron deficiency anemia secondary to blood loss (chronic): Secondary | ICD-10-CM

## 2017-06-17 DIAGNOSIS — G620 Drug-induced polyneuropathy: Secondary | ICD-10-CM

## 2017-06-17 DIAGNOSIS — C8194 Hodgkin lymphoma, unspecified, lymph nodes of axilla and upper limb: Secondary | ICD-10-CM | POA: Insufficient documentation

## 2017-06-17 DIAGNOSIS — Z23 Encounter for immunization: Secondary | ICD-10-CM | POA: Insufficient documentation

## 2017-06-17 DIAGNOSIS — R5383 Other fatigue: Secondary | ICD-10-CM | POA: Insufficient documentation

## 2017-06-17 MED ORDER — INFLUENZA VAC SPLIT QUAD 0.5 ML IM SUSY
0.5000 mL | PREFILLED_SYRINGE | Freq: Once | INTRAMUSCULAR | Status: AC
  Administered 2017-06-17: 0.5 mL via INTRAMUSCULAR
  Filled 2017-06-17: qty 0.5

## 2017-06-18 ENCOUNTER — Telehealth: Payer: Self-pay | Admitting: Hematology

## 2017-06-18 NOTE — Telephone Encounter (Signed)
Per 1/14 los return as needed

## 2017-07-18 ENCOUNTER — Telehealth: Payer: Self-pay | Admitting: Medical Oncology

## 2017-07-18 NOTE — Telephone Encounter (Signed)
Wants to be referred to  [facility] in [place]. Please make referral and fax records to [phone]

## 2017-07-19 ENCOUNTER — Telehealth: Payer: Self-pay

## 2017-07-19 NOTE — Telephone Encounter (Signed)
Pt requested referral to [facility] in [place], [place]. Dr. Irene Limbo okay with referral and aware that pt has moved to [place]. Information faxed to [phone] including patient demographics, OV notes from 10/02/16 and 06/17/17, as well as, PET scan from 04/03/17. Confirmed fax receipt 07/19/17 at 1616.

## 2017-11-20 DIAGNOSIS — F419 Anxiety disorder, unspecified: Secondary | ICD-10-CM

## 2018-04-12 ENCOUNTER — Other Ambulatory Visit: Payer: Self-pay | Admitting: Nurse Practitioner

## 2018-06-03 ENCOUNTER — Telehealth: Payer: Self-pay | Admitting: *Deleted

## 2018-06-03 ENCOUNTER — Telehealth: Payer: Self-pay | Admitting: Hematology

## 2018-06-03 NOTE — Telephone Encounter (Signed)
Called patient and left a voicemail in regards to 12/24 voicemail log.  Scheduled patient appointment wit Irene Limbo per his 01/14 stated return when needed.  Left voicemail and printed and mailed calendar.

## 2018-06-03 NOTE — Telephone Encounter (Signed)
Patient called office. Lives in [place]. Last appt with Dr. Irene Limbo 06/17/17.  Had called [facility] on 12/24 to set up appt with Dr. Irene Limbo. Scheduled for appt with Dr. Irene Limbo 06/25/18. Will not be able to keep appt and asked that it be cancelled. In [place] only for holidays - returning to [place] on 06/08/18. Was advised no appts available in that time frame. Patient states she has a PCP in [place], but has not established care with oncologist there due to financial concerns. Patient stated she had noticed a light patch on her scalp, had develped mild cold symptoms and has experienced some night sweats. Encouraged patient to see PCP once she returns to [place]. Patient states she will contact PCP to be seen on return to [place].  Advised patient that if symptoms worsen while she is in [place] she can be seen at Urgent Care. Patient verbalized understanding and thanked this caller for information.

## 2018-06-25 ENCOUNTER — Ambulatory Visit: Payer: Self-pay | Admitting: Hematology

## 2018-06-30 ENCOUNTER — Inpatient Hospital Stay
Admit: 2018-06-30 | Discharge: 2018-07-03 | Disposition: A | Discharge status: Home / Self Care | Payer: BLUE CROSS/BLUE SHIELD | Attending: Student in an Organized Health Care Education/Training Program | Admitting: Student in an Organized Health Care Education/Training Program

## 2018-06-30 DIAGNOSIS — Z8571 Personal history of Hodgkin lymphoma: Secondary | ICD-10-CM

## 2018-06-30 DIAGNOSIS — M2550 Pain in unspecified joint: Secondary | ICD-10-CM

## 2018-06-30 DIAGNOSIS — Z9221 Personal history of antineoplastic chemotherapy: Secondary | ICD-10-CM

## 2018-06-30 DIAGNOSIS — C8590 Non-Hodgkin lymphoma, unspecified, unspecified site: Secondary | ICD-10-CM

## 2018-06-30 DIAGNOSIS — R509 Fever, unspecified: Secondary | ICD-10-CM

## 2018-06-30 DIAGNOSIS — R590 Localized enlarged lymph nodes: Secondary | ICD-10-CM

## 2018-06-30 DIAGNOSIS — J45909 Unspecified asthma, uncomplicated: Secondary | ICD-10-CM

## 2018-06-30 DIAGNOSIS — R591 Generalized enlarged lymph nodes: Secondary | ICD-10-CM

## 2018-06-30 LAB — COMPLETE BLOOD COUNT WITH DIFF
Abs Basophils: 0.05 10*9/L (ref 0.0–0.1)
Abs Eosinophils: 0.12 10*9/L (ref 0.0–0.4)
Abs Imm Granulocytes: 0.02 10*9/L (ref <0.1)
Abs Lymphocytes: 1.91 10*9/L (ref 1.0–3.4)
Abs Monocytes: 0.3 10*9/L (ref 0.2–0.8)
Abs Neutrophils: 3.42 10*9/L (ref 1.8–6.8)
Hematocrit: 44.5 % (ref 36–46)
Hemoglobin: 15 g/dL (ref 12.0–15.5)
MCH: 31.7 pg (ref 26–34)
MCHC: 33.7 g/dL (ref 31–36)
MCV: 94 fL (ref 80–100)
Platelet Count: 222 10*9/L (ref 140–450)
RBC Count: 4.73 10*12/L (ref 4.0–5.2)
WBC Count: 5.8 10*9/L (ref 3.4–10)

## 2018-06-30 LAB — COMPREHENSIVE METABOLIC PANEL
AST: 25 U/L (ref 17–42)
Alanine transaminase: 16 U/L (ref 11–50)
Albumin, Serum / Plasma: 4.1 g/dL (ref 3.5–4.8)
Alkaline Phosphatase: 53 U/L (ref 31–95)
Anion Gap: 9 (ref 4–14)
Bilirubin, Total: 0.7 mg/dL (ref 0.2–1.3)
Calcium, total, Serum / Plasma: 9.4 mg/dL (ref 8.8–10.3)
Carbon Dioxide, Total: 26 mmol/L (ref 22–32)
Chloride, Serum / Plasma: 102 mmol/L (ref 97–108)
Creatinine: 0.62 mg/dL (ref 0.44–1.00)
Glucose, non-fasting: 90 mg/dL (ref 70–199)
Potassium, Serum / Plasma: 3.5 mmol/L (ref 3.5–5.1)
Protein, Total, Serum / Plasma: 8 g/dL (ref 6.0–8.4)
Sodium, Serum / Plasma: 137 mmol/L (ref 135–145)
Urea Nitrogen, Serum / Plasma: 5 mg/dL — ABNORMAL LOW (ref 6–22)
eGFR - high estimate: >120 mL/min (ref >60)
eGFR - low estimate: >120 mL/min (ref >60)

## 2018-06-30 LAB — POCT URINE PREGNANCY, >= 18 YE
POCT Preg Test, Ur: NEGATIVE
Test Kit Lot Number: 136488

## 2018-06-30 LAB — SEDIMENTATION RATE: Sedimentation Rate (MB): 36 mm/h (ref 2.0–37.0)

## 2018-06-30 LAB — LACTATE DEHYDROGENASE, BLOOD: Lactate Dehydrogenase, Serum /: 175 U/L (ref 102–199)

## 2018-06-30 LAB — BLOOD SMEAR MORPHOLOGY

## 2018-06-30 LAB — C-REACTIVE PROTEIN: C-Reactive Protein: 1.6 mg/L (ref <7.5)

## 2018-06-30 LAB — CREATINE KINASE, TOTAL: Creatine kinase, total: 305 U/L — ABNORMAL HIGH (ref 37–241)

## 2018-06-30 MED ORDER — LORATADINE 10 MG TABLET
10 | ORAL | Status: AC
Start: 2018-06-30 — End: ?

## 2018-06-30 MED ORDER — ALBUTEROL SULFATE HFA 90 MCG/ACTUATION AEROSOL INHALER
90 | RESPIRATORY_TRACT | Status: AC
Start: 2018-06-30 — End: ?

## 2018-06-30 MED ORDER — SODIUM CHLORIDE 0.9 % (FLUSH) INJECTION SYRINGE
0.9 | Freq: Three times a day (TID) | INTRAMUSCULAR | Status: DC
Start: 2018-06-30 — End: 2018-07-02
  Administered 2018-07-01 – 2018-07-02 (×4): via INTRAVENOUS

## 2018-06-30 MED ORDER — SODIUM CHLORIDE 0.9 % (FLUSH) INJECTION SYRINGE
0.9 | INTRAMUSCULAR | Status: DC | PRN
Start: 2018-06-30 — End: 2018-07-02

## 2018-06-30 MED ORDER — ALBUTEROL SULFATE HFA 90 MCG/ACTUATION AEROSOL INHALER
90 | Freq: Four times a day (QID) | RESPIRATORY_TRACT | Status: DC | PRN
Start: 2018-06-30 — End: 2018-07-02
  Administered 2018-07-01: 10:00:00

## 2018-06-30 NOTE — Consults (Addendum)
HEMATOLOGY ONCOLOGY INITIAL CONSULT NOTE     Consult requested by Medicine service for evaluation of fevers, chills, lymphadenopathy.    History of Present Illness    28yF with history of Stage IVb Hodgkin's Lymphoma s/p A-AVD x 6 (completed in Oct 2018) with CR who presents with 6 weeks of recurrent fevers, chills, and lymphadenopathy.    She was previously treated by Dr. Sullivan Lone in [facility]. She was diagnosed with Stage IVb HL in 09/2016 after 6 months of generalized body aches and flulike symptoms with some subjective fevers chills and night sweats, 40 pound weight loss in 4-5 months, as well as development of lymphadenopathy. She underwent left axillary lymph node biopsy in 09/24/2016 revealing the diagnosis of classical HL. She underwent 6 cycles of A-AVD which she completed in 03/2017. Her interim PET/CT after 3 cycles of treatment showed excellent response and her PET/CT scan on 04/03/2017 after 6 cycles showed CR with no disease greater than Deauville 2. 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     She moved to [place] for her treatment primarily because of family support and her mother being there and moved back to [place] in January 2019. She was recommended by Dr. Irene Limbo to see a hematologist at [facility] for follow-up but due to insurance issues has not seen any provider.    About 6 weeks ago she developed fevers, chills, and night sweats. She also endorses low energy and loss of appetite. She notes some chest and back pain similar to when she was originally diagnosed with HL. She notes feeling like her clothes are more loose but no weight loss based on her actual weight. She initially thought her loss of energy and appetite were due to her grandmother passing away in Oct 2019 but when her symptoms persisted she became more concerned. Recent travel history to [place] for 6 days in January but this  travel was after the onset of her constitutional symptoms. She also notes some new joint pain in her ankles and hips for the past several weeks. No new skin rashes she has noted.      History reviewed. No pertinent past medical history.  History reviewed. No pertinent surgical history.    Allergies: Patient has no known allergies.     Scheduled Meds:  Continuous Infusions:  PRN Meds:    Social History     Socioeconomic History    Marital status: Significant Other     Spouse name: None    Number of children: None    Years of education: None    Highest education level: None   Occupational History    None   Social Transport planner strain: None    Food insecurity:     Worry: None     Inability: None    Transportation needs:     Medical: None     Non-medical: None   Tobacco Use    Smoking status: None   Substance and Sexual Activity    Alcohol use: None    Drug use: None    Sexual activity: None   Lifestyle    Physical activity:     Days per week: None     Minutes per session: None    Stress: None   Relationships    Social connections:     Talks on phone: None     Gets together: None  Attends religious service: None     Active member of club or organization: None     Attends meetings of clubs or organizations: None     Relationship status: None    Intimate partner violence:     Fear of current or ex partner: None     Emotionally abused: None     Physically abused: None     Forced sexual activity: None   Other Topics Concern    None   Social History Narrative    None       Family History:  Lupus  Cousin with some type of blood cancer    ROS    Vitals  Temp:  [36.4 C (97.5 F)-36.7 C (98.1 F)] 36.7 C (98.1 F)  Heart Rate:  [79-80] 80  *Resp:  [18] 18  BP: (145-153)/(91-102) 153/102  SpO2:  [99 %-100 %] 99 %    Pain Score:      Physical Exam   Constitutional: She is oriented to person, place, and time. She appears well-developed and well-nourished.   Cardiovascular: Normal rate and  regular rhythm.   Pulmonary/Chest: Effort normal and breath sounds normal.   Abdominal: Soft. Bowel sounds are normal.   Right inguinal palpable lymph node   Neurological: She is alert and oriented to person, place, and time.       Data    CBC        06/30/18  1018   WBC 5.8   HGB 15.0   HCT 44.5   PLT 222     Coags  No results found in last 72 hours    Chem7        06/30/18  1018   NA 137   K 3.5   CL 102   CO2 26   BUN 5*   CREAT 0.62   GLU 90     Electrolytes        06/30/18  1018   CA 9.4     Liver Panel        06/30/18  1018   AST 25   ALT 16   ALKP 53   TBILI 0.7   TP 8.0   ALB 4.1   Radiology Results   Xr Chest 2 Views Pa And Lateral    Result Date: 06/30/2018  FINDINGS/IMPRESSION: Normal cardiac and mediastinal silhouettes.  Lungs appear clear. Report dictated by: Aretta Nip, MD, signed by: Aretta Nip, MD Department of Radiology and Biomedical Imaging    Prior imaging found in care everywhere:  PET/CT (04/03/2017):  FINDINGS: NECK Considerable activity in the muscular tissues of the neck and supraclavicular adipose tissues, attributed to physiologic activity and brown fat related activity. No enlarged lymph nodes. Faint  activity deep to the sternocleidomastoid muscles on some images is without CT correlate other than adipose tissue, and accordingly is not thought to be related to adenopathy.    CHEST - Bilateral mild axillary adenopathy including a 1.3 cm right axillary lymph node (stable size) with maximum SUV 1.9 (formerly 2.0). No appreciable pulmonary nodule. Indistinct prevascular lymph nodes are present and somewhat indistinct, individually measuring up to 0.8 cm in short axis as on image 52/4, subjectively these appear minimally reduced compared the prior exam. There are foci of accentuated axillary and mediastinal activity corresponding to adipose tissues rather than nodes, again probably from brown fat distribution. There is also accentuated paraspinal activity in the upper  thorax likely along the intercostal spaces, 18 without CT correlate and  favoring brown fat distribution.    Right Port-A-Cath tip: Right atrium. The prior ground-glass opacities in the lungs have resolved.    Background mediastinal blood pool activity 2.4.    ABDOMEN/PELVIS  The previously seen hypermetabolic left inguinal lymph node currently measures 0.8 cm in short axis (previously 1.0 cm by my measurement) and has a fatty hilum, maximum SUV 1.3 compared to prior measurement of 4.9. A left external iliac node is mildly enlarged at 1.1 cm in short axis on image 157/4 (previously the same) and with maximum standard uptake value 1.8 (formerly 2.1).    No other significant adenopathy is identified. Background hepatic activity SUV 3.3.    Contracted gallbladder.    SKELETON - Generalized mild accentuation of marrow activity, similar to prior.    IMPRESSION:  1. Bilateral mild axillary adenopathy along with the mild left external iliac adenopathy persists, uptake is Deauville 2 and similar to minimally reduced compared to the prior exam. Similar Deauville 2 activity in the the remaining prevascular lymph nodes which are upper normal sized and indistinctly marginated.  2. The previously hypermetabolic left inguinal lymph node is no longer significantly hypermetabolic, and may have been reactive. 3. There is considerable activity in some of the muscular and fatty tissues of the neck and chest without CT correlate, likely due to a combination of brown fat distribution and physiologic activity. 4. Generalized mild accentuation of marrow activity, probably related to marrow/granulocyte stimulation. 5. Prior ground-glass opacities in the lungs have resolved.    PET/CT (01/03/2017):FINDINGS:  NECK    Reduction in metabolic activity of lymph node within a LEFT parotid  gland tissue with no residual activity above mediastinal activity.    CHEST    Marked reduction in size and metabolic activity of bilateral  axillary lymph nodes.  The remaining lymph nodes have metabolic  activity of similar to mediastinal activity (SUV max equal 2.6)  decreased from 8.0.    Likewise the prevascular adenopathy has resolved with metabolic  activity with activity equal to mediastinal activity.    No suspicious pulmonary nodules. There is mild ground-glass  opacities in the upper lobes with mild metabolic activity with SUV  max equal 3.6.    ABDOMEN/PELVIS    Bulky retroperitoneal periaortic adenopathy is much reduced in size  and metabolic activity. The periaortic lymph nodes are 10 mm or less  with metabolic activity similar to or less than mediastinal  activity. Individual lymph nodes are difficult to define. LEFT  periaortic node measuring 1.7 SUV max decreased from 9.0.    In the pelvis likewise reduction in bulky iliac lymphadenopathy. The  LEFT external iliac lymph nodes are mildly enlarged 1.2 cm with SUV  max equal 2.7 decreased from 11.5.    Moderate activity associated with inguinal lymph nodes. Most intense  in the LEFT superficial inguinal node with SUV max equal 5.0 which  is moderately above liver activity (SUV max 3.7). This activity is  new from prior. This lymph node is normal size. (Image 25 of fused  data set). Potential reactive adenopathy    Normal spleen.    SKELETON    There is diffuse increase in marrow activity favored to represent  GCSF type response.    IMPRESSION:  1. Marked reduction in size and metabolic activity of axillary  adenopathy, mediastinal adenopathy, and periaortic retroperitoneal  adenopathy. Activity of these nodes is similar to mediastinal  activity and less than liver activity ( Deauville 2).  2. Decreased in size and metabolic  activity of bilateral iliac  lymphadenopathy with enlarged lymph nodes remaining ( Deauville 2).  3. Most intense activity remains within LEFT inguinal lymph node  with activity above liver activity Deauville 4. However, this lymph  node was not metabolic on comparison examination and is  normal size.  Potential reactive node.  4. Increase in marrow activity is favored GCSF type response.  5. Mild metabolic activity associated ground-glass opacities in the  upper lobes is favored infectious or inflammatory. Correlate with  drug reaction / toxicity.  These results will be called to the ordering clinician or  representative by the Radiologist Assistant, and communication  documented in the PACS or zVision Dashboard.    PET/CT (10/2016):    FINDINGS:  NECK    Left-sided deep parotid lymph node about 0.8 cm in short axis on  image 19/4, maximum SUV 7.6.    Left station IIa lymph nodes are small, measuring up to 7 mm in  short axis on image 23/4, with maximum SUV 4.5. Somewhat similar  small right station 2A lymph nodes.    Accentuated metabolic activity in the supraclavicular region is  thought to be due to a combination of lymph nodes but also  hypermetabolic brown fat. An index left supraclavicular lymph node  with short axis diameter of 1.1 cm on image 40/4 has a maximum  standard uptake value of 6.3.    CHEST    Enlarged and hypermetabolic bilateral axillary, bilateral  subpectoral, and prevascular adenopathy is noted. An index left  lateral axillary lymph node on image 51/4 measures 2.8 cm in short  axis and has a maximum standard uptake value of 8.0.    Port-A-Cath tip: Right atrium.    For reference purposes, background mediastinal blood pool activity  is 2.7.    ABDOMEN/PELVIS    Hypermetabolic and enlarged retrocrural, periaortic, iliac chain,  and inguinal adenopathy. An index left eccentric periaortic lymph  node measures 1.7 cm on image 112/4 and has a maximum standard  uptake value of 7.6. A left common iliac node measuring 2.2 cm on  image 139/4 has a maximum standard uptake value of 19.0. An index  left external iliac node measuring 2.2 cm in short axis on image 105  T5 of series 4 has a maximum standard uptake value of 10.1. Left  inguinal lymph node measuring 1.5 cm in short axis on image  161/4  has a maximum standard uptake value of 7.2.    For reference purposes, background liver activity SUV is 3.1.    SKELETON    Variable degrees of hypermetabolic activity within the bony  structures including the left mandible, right proximal humerus,  spine, bony pelvis, and proximal femurs. An index lesion in the left  iliac bone above the acetabulum has a maximum SUV of 11.4, this is  one of the more hypermetabolic foci although many of the lesions  have less striking but still abnormal activity.    There appears to be a small hypermetabolic right brachial lymph node  in the right upper extremity near the proximal humeral metaphysis  which is mildly hypermetabolic, this measures 8 mm in short axis  diameter on image 31/4 and has maximum SUV of 5.1.    IMPRESSION:  1. Extensive hypermetabolic adenopathy in the chest, abdomen, and pelvis compatible with active lymphoma, maximum standard uptake value measurements up to 19.0 (Deauville 5).  2. Scattered hypermetabolic activity in the axial and appendicular skeleton, much of which is Deauville 5.  3. Level  IIa lymph nodes in the neck are small and Deauville 4.    Assessment and Recommendations    Ani Deoliveira is a 28 y.o. female with history of Stage IVb Hodgkin's Lymphoma s/p A-AVD x 6 (completed in Oct 2018) with CR who presents with 6 weeks of recurrent fevers, chills, and lymphadenopathy.    The constitutional symptoms are very concerning for recurrence of hodgkin's lymphoma. She will need a whole body PET/CT scan as well as a core/excisional biopsy of an FDG avid, most accessible lymph node (perhaps her right inguinal lymph node) to make a definitive diagnosis. If this is recurrent HL then she will need salvage chemotherapy followed by AutoHCT. Though our biggest concern is recurrence of her disease, we would also recommend doing a thorough work-up for B symptoms including ruling out infections, basic autoimmune work-up esp in light of her joint pains, as  well as checking her TSH.    Recommendations:  -please order whole body PET/CT scan  -plan to get core biopsy of an accessible node that is FDG avid on her PET/CT scan  -please order hepatitis B sAg, Hepatitis B sAb, Hepatitis B core Ab, Hepatitis C Ab, and HIV in preparation of likely needing chemotherapy  -defer to primary team regarding basic infectious work-up, TSH, and basic autoimmune work-up  -if she is stable, she can be discharged after her biopsy with plan to follow-up in outpatient malignant hematology clinic which we will help coordinate    We will continue to follow closely. Patient was discussed with attending Dr. Helaine Chess. Separate attending attestation to follow.    Kem Kays MD  Heme/Onc Fellow  Pager: [phone]      Code Status: FULL    Kem Kays, MD  06/30/2018

## 2018-06-30 NOTE — ED Provider Notes (Addendum)
ED First Attending   Lenis Dickinson E    History     Chief Complaint   Patient presents with    Night Sweats     pt is in remisssion from lymphome diagnosis in 2018 presenting with night sweats x 6 weeks, increasing fatigue, intermittent chills during the day    Cough     cough x 2 weeks - took amox finished without improvement        HPI     Debbie Grant is a 28 yo female with history of Hodgkin lymphoma diagnosed in Apr '18 s/p chemo in remission since Oct '18 p/w 3 months of fatigue, night sweats, chills, cough and difficulty breathing at night. Her job has been stressful and grandmother passed away about 3 months ago. She went to urgent care about 2 weeks ago for cough and was started on Amoxicillin for 10 day. She was told to go to ED if symptoms don't improve. Around the same time, she started having joint pain over sternum and b/l hips. The patient moved from [place] in Jan 2019. The patient was seen by PCP in August '19 and oncology referral should be in placed but she has not been able to establish care with oncology in [facility] yet. She has had R inguinal lymphadenopathy that she noticed 2 days ago but had no fever, vomiting or diarrhea. She is not sure if she lost weight.     She traveled to [place] about 1 month ago for 6 days. No known sick contacts. 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 She is sexually active with a female partner but has not had period for 6 weeks although she usually has regular period. She uses CBD oil and takes edible THC for pain.       Allergies/Contraindications  No Known Allergies    Previous Medications    ALBUTEROL 90 MCG/ACTUATION METERED DOSE INHALER    Inhale 2 puffs into the lungs.    LORATADINE (CLARITIN) 10 MG TABLET    Take 10 mg by mouth Daily.       History reviewed. No pertinent past medical history.    History reviewed. No pertinent surgical history.    No family history on file.    Social History     Questions Responses    Primary Legal  Guardian     Who lives at home?             Review of Systems     Review of Systems   Constitutional: Positive for appetite change, chills, diaphoresis and fatigue. Negative for fever.   HENT: Negative for congestion, rhinorrhea and sore throat.    Eyes: Negative.    Respiratory: Positive for cough. Negative for shortness of breath.    Cardiovascular: Negative for chest pain.   Gastrointestinal: Negative for abdominal pain, diarrhea and vomiting.   Endocrine: Negative.    Genitourinary: Negative for dysuria and hematuria.   Musculoskeletal: Positive for arthralgias (sternum and b/l hips).   Skin: Negative.    Allergic/Immunologic: Negative.    Neurological: Negative for headaches.   Hematological: Positive for adenopathy (R inguinal).   Psychiatric/Behavioral: Negative.    All other systems reviewed and are negative.      Physical Exam   Triage Vital Signs:  BP: (!) 145/91, Heart Rate: 79, Pulse - Palpated/Pleth: 79, Temp: 36.4 C (97.5 F), *Resp: 18, SpO2: 100 %    Physical Exam  Vitals signs reviewed.   Constitutional:  General: She is not in acute distress.     Appearance: Normal appearance. She is obese. She is not ill-appearing, toxic-appearing or diaphoretic.   HENT:      Head: Normocephalic.      Right Ear: Tympanic membrane normal.      Left Ear: Tympanic membrane normal.      Nose: Nose normal.      Mouth/Throat:      Mouth: Mucous membranes are moist.      Pharynx: Oropharynx is clear.   Eyes:      Extraocular Movements: Extraocular movements intact.      Conjunctiva/sclera: Conjunctivae normal.      Pupils: Pupils are equal, round, and reactive to light.   Neck:      Musculoskeletal: Normal range of motion.   Cardiovascular:      Rate and Rhythm: Normal rate and regular rhythm.      Pulses: Normal pulses.      Heart sounds: No murmur.   Pulmonary:      Effort: Pulmonary effort is normal. No respiratory distress.      Breath sounds: Normal breath sounds. No wheezing.   Abdominal:      General:  Abdomen is flat. Bowel sounds are normal.      Palpations: Abdomen is soft. There is no mass.      Tenderness: There is no tenderness.   Musculoskeletal: Normal range of motion.   Lymphadenopathy:      Cervical: No cervical adenopathy.      Upper Body:      Right upper body: No supraclavicular, axillary or epitrochlear adenopathy.      Left upper body: No supraclavicular, axillary or epitrochlear adenopathy.      Lower Body: Right inguinal adenopathy (<0.5 cm in R inguinal ) present. No left inguinal adenopathy.   Skin:     General: Skin is warm.      Capillary Refill: Capillary refill takes less than 2 seconds.   Neurological:      General: No focal deficit present.      Mental Status: She is alert and oriented to person, place, and time.   Psychiatric:         Mood and Affect: Mood normal.         Behavior: Behavior normal.           Initial Assessment (problem list and differential diagnosis)     [name] is a 28 yo female with history of Hodgkin lymphoma diagnosed in Apr '18 s/p chemo in remission since Oct '18 p/w 3 months of fatigue, night sweats, chills, cough and difficulty breathing at night and 2 weeks of joint pain concerning for recurrence. The patient has not been able to establish care with oncology in [facility] yet.     Plan:  -urine hCG   -CXR  -Labs  -Adult hospitalist consult    Interpretations:  Lab, Imaging, EKG & Rhythm Strip     CBC, CMP, CRP, and ESR unremarkable. CK slightly elevated.  Urine hCG negative  I have reviewed the patient's Radiology report(s).  CXR: within normal limits.    Reassessment and Final Disposition     Discussed with adult hospitalist who reccommended the patient to be transferred to [place] for further evaluation. The patient agreed with the plan.    Dispo: transfer to [facility]    ED Course as of Jun 30 2024   [place] Documentation   Mon Jun 30, 2018   2014 Pt with slightly elev  CK, normal LDH  Spoke with [facility] and malignant heme  They rec admission to  medicine with heme consult, will need PET scan and biopsy          Others' Documentation   Mon Jun 30, 2018   0956 Wants to be called (Q). 28 yo F hx lymphoma in remission and moved to [place] approx. 1 year ago now presenting with increasing stress, fatigue and chills. She also have cough.   She want to urgent care and rx amox. Also stating more joint pain. She is a Pharmacist, hospital.   Well appearing, nad, nontoxic, op clear, from neck, ctab, rrr, s/nt without hsm, she noted some right inguinal LAD.     [AK]   1107 Discussed with hospitalist and plan to send to [place] for further evaluation    [AK]      ED Course User Index  [AK] Anson Fret, MD              Sung Amabile, DO  Resident  06/30/18 [place], MD  Resident  06/30/18 2703       Anson Fret, MD  06/30/18 2118

## 2018-06-30 NOTE — H&P (Signed)
HOSPITAL MEDICINE H&P NOTE - ATTENDING ONLY     My date of service is 06/30/2018.    PCP  Pecolia Ades, MD    Family/Surrogate Contact Info  RIDLEY,KENYATTA Mother   [phone]   Veto Kemps Significant other   [phone]       Chief Complaint  Fatigue, hot sweats    History of Present Illness  [name] is a 28 yo F hx NHL s/p tx 6 cycles A-AVD 2018 tx in [place] (available in care everywhere) p/w several months of increasing fatigue, nightsweats, and right inguinal lymphadenopathy concerning for a relapse of lymphoma.    The patient noted earlier satiety and itchy skin for past three months, increasing joint pain, fatigue, loss of appetite, and hot sweats for the past month. This month she went to urgent care due to dry cough and SOB. She completed a 10 day course of amox on Saturday. Cough is improved but still w/ constitutional symptoms, so presented to ED for c/f lymphoma.    Patient has been trying to establish w/ [place] as an outpatient but unable to due to insurance / other issues.   Otherwise feeling well.    ED Course  Evaluated by heme/onc in ED, admit recommended    Patient History  Past Medical History:   Diagnosis Date    NHL (non-Hodgkin's lymphoma) ([place])      Past Surgical History:   Procedure Laterality Date    ARTHROSCOPIC REPAIR ACL          Allergies/Contraindications  No Known Allergies    Prior to Admission medications    Medication Instructions   albuterol 90 mcg/actuation metered dose inhaler Inhale 2 puffs into the lungs.   loratadine (CLARITIN) 10 mg tablet Take 10 mg by mouth Daily.     Medications reconciled with patient/surrogate? Yes    Social History     Socioeconomic History    Marital status: Significant Other     Spouse name: None    Number of children: None    Years of education: None    Highest education level: None   Occupational History    None   Social Transport planner strain: None    Food insecurity:     Worry: None     Inability: None     Transportation needs:     Medical: None     Non-medical: None   Tobacco Use    Smoking status: Never Smoker    Smokeless tobacco: Never Used   Substance and Sexual Activity    Alcohol use: None    Drug use: None    Sexual activity: None   Lifestyle    Physical activity:     Days per week: None     Minutes per session: None    Stress: None   Relationships    Social connections:     Talks on phone: None     Gets together: None     Attends religious service: None     Active member of club or organization: None     Attends meetings of clubs or organizations: None     Relationship status: None    Intimate partner violence:     Fear of current or ex partner: None     Emotionally abused: None     Physically abused: None     Forced sexual activity: None   Other Topics Concern    None   Social History Narrative  None       History reviewed. No pertinent family history.      Review of Systems   Constitutional: Positive for chills, fever and weight loss.   HENT: Negative for hearing loss.    Eyes: Negative for blurred vision and double vision.   Respiratory: Positive for cough. Negative for hemoptysis and sputum production.    Cardiovascular: Positive for chest pain. Negative for palpitations.        Pressure, non-exertional, radiating to back   Gastrointestinal: Positive for nausea. Negative for abdominal pain, heartburn and vomiting.        Early satiety     Genitourinary: Negative for dysuria and urgency.   Musculoskeletal: Positive for joint pain. Negative for myalgias and neck pain.   Skin: Positive for itching.   Neurological: Positive for tingling, weakness and headaches. Negative for focal weakness.        B/l legs, feet, unsure if new   Endo/Heme/Allergies: Negative.        Physical Exam  Temp:  [36.4 C (97.5 F)-36.8 C (98.2 F)] 36.8 C (98.2 F)  Heart Rate:  [79-80] 80  BP: (105-153)/(74-102) 122/81  *Resp:  [18-19] 19  SpO2:  [99 %-100 %] 99 %    Physical Exam  Constitutional:       Appearance:  Normal appearance. She is normal weight.   HENT:      Head: Normocephalic and atraumatic.      Right Ear: External ear normal.      Left Ear: External ear normal.      Nose: Nose normal.      Mouth/Throat:      Mouth: Mucous membranes are moist.      Pharynx: Oropharynx is clear. No oropharyngeal exudate or posterior oropharyngeal erythema.   Eyes:      Extraocular Movements: Extraocular movements intact.      Pupils: Pupils are equal, round, and reactive to light.   Neck:      Musculoskeletal: Normal range of motion and neck supple. No neck rigidity.   Cardiovascular:      Rate and Rhythm: Normal rate and regular rhythm.      Pulses: Normal pulses.      Heart sounds: Normal heart sounds. No murmur.   Pulmonary:      Effort: Pulmonary effort is normal. No respiratory distress.      Breath sounds: No wheezing.   Abdominal:      General: Bowel sounds are normal.      Palpations: Abdomen is soft.      Tenderness: There is tenderness.      Comments: Mild RLQ ttp   Musculoskeletal: Normal range of motion.         General: No swelling or tenderness.   Lymphadenopathy:      Cervical: No cervical adenopathy.   Skin:     General: Skin is warm and dry.   Neurological:      General: No focal deficit present.      Mental Status: She is alert and oriented to person, place, and time.   Psychiatric:         Mood and Affect: Mood normal.         Behavior: Behavior normal.       Data  I have reviewed pertinent labs, microbiology, radiology studies, EKGs, and telemetry as relevant.    Pertinent results include:      137 102 5* / 90    3.5 26 0.62 \  01/27 1018     5.8 \ 15.0 / 222    / 44.5 \    01/27 1018       UA  No results found in last 7 days  Microscopy:  No results found in last 7 days    Microbiology  Microbiology Results (last 72 hours)     Procedure Component Value Units Date/Time    MRSA Culture [[id number]]     Order Status:  Sent Specimen:  Anterior Nares Swab     Rapid Influenza A/B/RSV PCR [[id number]] Collected:   06/30/18 1330    Order Status:  Completed Specimen:  Nasopharyngeal Swab Updated:  06/30/18 1903     Influenza A RNA Not detected     Influenza B RNA Not detected     RSV RNA Not detected     Comments --     Reference value for all analytes: Not Detected.  Negative test results do not rule out respiratory viral infection.   (Results reported to [facility])      Respiratory Viral Panel PCR [[id number]] Collected:  06/30/18 1330    Order Status:  Sent Specimen:  Nasopharyngeal Swab Updated:  06/30/18 1422        Radiology   Xr Chest 2 Views Pa And Lateral    Result Date: 06/30/2018  FINDINGS/IMPRESSION: Normal cardiac and mediastinal silhouettes.  Lungs appear clear. 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 Also could consider occult infection or rheum process.  -rapid flu neg  -RVP pending  -ANA, TSH, BCx pending    #Hx Asthma  -continue home albuterol prn      Inpatient Checklist  Code Status: FULL    (Code Status confirmed? Yes)   Discharge planning: pending petct    DVT prophylaxis: contraindicated; pending bx   Access: PIV's  Diet: Regular Diet    Bowel regimen: none      No Foley   Delirium:    AWOL:      Nu-DESC:      Order Set ordered (recommend ordering if AWOL or Nu-DESC >= 2): No    Severity of Illness  Progression of chronic illness    Tressia Danas, MD

## 2018-07-01 LAB — COMPLETE BLOOD COUNT WITH DIFF
Abs Basophils: 0.04 10*9/L (ref 0.0–0.1)
Abs Eosinophils: 0.15 10*9/L (ref 0.0–0.4)
Abs Imm Granulocytes: 0.01 10*9/L (ref <0.1)
Abs Lymphocytes: 1.75 10*9/L (ref 1.0–3.4)
Abs Monocytes: 0.38 10*9/L (ref 0.2–0.8)
Abs Neutrophils: 2.68 10*9/L (ref 1.8–6.8)
Hematocrit: 39.8 % (ref 36–46)
Hemoglobin: 13.6 g/dL (ref 12.0–15.5)
MCH: 32.1 pg (ref 26–34)
MCHC: 34.2 g/dL (ref 31–36)
MCV: 94 fL (ref 80–100)
Platelet Count: 209 10*9/L (ref 140–450)
RBC Count: 4.24 10*12/L (ref 4.0–5.2)
WBC Count: 5 10*9/L (ref 3.4–10)

## 2018-07-01 LAB — RESPIRATORY VIRAL PANEL PCR
Adenovirus DNA: NOT DETECTED
Influenza A (H1) RNA: NOT DETECTED
Influenza A (H3) RNA: NOT DETECTED
Influenza A RNA: NOT DETECTED
Influenza B RNA: NOT DETECTED
Metapneumovirus RNA: NOT DETECTED
Parainfluenza 1 RNA: NOT DETECTED
Parainfluenza 2 RNA: NOT DETECTED
Parainfluenza 3 RNA: NOT DETECTED
RSV A RNA: NOT DETECTED
RSV B RNA: NOT DETECTED
Rhinovirus RNA: NOT DETECTED

## 2018-07-01 LAB — HEPATITIS B SURFACE ANTIGEN: Hep B surf Ag: NEGATIVE

## 2018-07-01 LAB — BASIC METABOLIC PANEL (NA, K,
Anion Gap: 9 (ref 4–14)
Calcium, total, Serum / Plasma: 8.8 mg/dL (ref 8.8–10.3)
Carbon Dioxide, Total: 24 mmol/L (ref 22–32)
Chloride, Serum / Plasma: 102 mmol/L (ref 97–108)
Creatinine: 0.58 mg/dL (ref 0.44–1.00)
Glucose, non-fasting: 79 mg/dL (ref 70–199)
Potassium, Serum / Plasma: 3.7 mmol/L (ref 3.5–5.1)
Sodium, Serum / Plasma: 135 mmol/L (ref 135–145)
Urea Nitrogen, Serum / Plasma: 7 mg/dL (ref 6–22)
eGFR - high estimate: >120 mL/min (ref >60)
eGFR - low estimate: >120 mL/min (ref >60)

## 2018-07-01 LAB — INFLUENZA A/B/RSV PCR
Influenza A RNA: NOT DETECTED
Influenza B RNA: NOT DETECTED
RSV RNA: NOT DETECTED

## 2018-07-01 LAB — HEPATITIS B CORE ANTIBODY, TOT: Hep B Core Ab Total: NEGATIVE

## 2018-07-01 LAB — THYROID STIMULATING HORMONE: Thyroid Stimulating Hormone: 0.94 mIU/L (ref 0.45–4.12)

## 2018-07-01 LAB — HEPATITIS C ANTIBODY: Hep C Ab, Qual: NEGATIVE

## 2018-07-01 LAB — HIV ANTIBODY AND ANTIGEN COMBI: HIV Ag/Ab Combo: NEGATIVE

## 2018-07-01 LAB — HEPATITIS B SURFACE ANTIBODY,: Hep B surf Ab, Quant: 790 m[IU]/mL

## 2018-07-01 MED ORDER — ELECTROLYTE-A INTRAVENOUS SOLUTION
INTRAVENOUS | Status: DC
Start: 2018-07-01 — End: 2018-07-01
  Administered 2018-07-01: 10:00:00 75 mL/h via INTRAVENOUS

## 2018-07-01 MED ORDER — SODIUM CHLORIDE 0.9 % INTRAVENOUS SOLUTION
0.9 | INTRAVENOUS | Status: AC
Start: 2018-07-01 — End: 2018-07-01
  Administered 2018-07-01: 13:00:00 75 mL/h via INTRAVENOUS

## 2018-07-01 MED ORDER — ONDANSETRON HCL 4 MG TABLET
4 | Freq: Four times a day (QID) | ORAL | Status: DC | PRN
Start: 2018-07-01 — End: 2018-07-02

## 2018-07-01 MED ORDER — ACETAMINOPHEN 325 MG TABLET
325 | Freq: Four times a day (QID) | ORAL | Status: DC | PRN
Start: 2018-07-01 — End: 2018-07-02
  Administered 2018-07-02: 23:00:00 via ORAL

## 2018-07-01 NOTE — Progress Notes (Signed)
HOSPITAL MEDICINE PROGRESS NOTE - ATTENDING ONLY     My date of service is 07/01/18    24 Hour Course/Overnight Events  Admitted to Medicine    Subjective/Review of Systems  Feels the same today  Remains very fatigued, some night sweats overnight     Vitals  Temp:  [36.2 C (97.2 F)-36.8 C (98.2 F)] 36.2 C (97.2 F)  Heart Rate:  [61-83] 83  BP: (105-133)/(73-93) 120/73  *Resp:  [16-19] 16  SpO2:  [96 %-100 %] 98 %  O2 Device: None (Room air)    Physical Exam  Constitutional:       Appearance: Normal appearance. She is normal weight.   HENT:      Head: Normocephalic and atraumatic.      Right Ear: External ear normal.      Left Ear: External ear normal.      Nose: Nose normal.      Mouth/Throat:      Mouth: Mucous membranes are moist.      Pharynx: Oropharynx is clear. No oropharyngeal exudate or posterior oropharyngeal erythema.   Eyes:      Extraocular Movements: Extraocular movements intact.      Pupils: Pupils are equal, round, and reactive to light.   Neck:      Musculoskeletal: Normal range of motion and neck supple. No neck rigidity.   Cardiovascular:      Rate and Rhythm: Normal rate and regular rhythm.      Pulses: Normal pulses.      Heart sounds: Normal heart sounds. No murmur.   Pulmonary:      Effort: Pulmonary effort is normal. No respiratory distress.      Breath sounds: No wheezing.   Abdominal:      General: Bowel sounds are normal.      Palpations: Abdomen is soft.      Tenderness: There is tenderness.      Comments: Mild RLQ ttp   Musculoskeletal: Normal range of motion.         General: No swelling or tenderness.   Lymphadenopathy:      Cervical: No cervical adenopathy.   Skin:     General: Skin is warm and dry.   Neurological:      General: No focal deficit present.      Mental Status: She is alert and oriented to person, place, and time.   Psychiatric:         Mood and Affect: Mood normal.         Behavior: Behavior normal.     Data  I have reviewed pertinent labs, microbiology, radiology  studies, EKGs, and telemetry as relevant.    Last Lab Results     Procedure Component Value Units Date/Time    Hepatitis C Antibody [[id number]] Collected:  07/01/18 0603    Specimen:  Serum Updated:  07/01/18 1203     Hepatitis C Antibody NEG    anti-HBs (Hepatitis B Surface Antibody, Quantitative) [[id number]] Collected:  07/01/18 0603    Specimen:  Serum Updated:  07/01/18 1203     Hepatitis B Surface Antibody, Quantitative 790 mIU/mL     Hepatitis B Surface Antigen [[id number]] Collected:  07/01/18 0603    Specimen:  Serum Updated:  07/01/18 1201     Hepatitis B Surface Antigen NEG    Hepatitis B Core Antibody, Total [[id number]] Collected:  07/01/18 0603    Specimen:  Serum Updated:  07/01/18 1201     Hepatitis B Core Antibody, Total NEG  HIV Ag/Ab Combo [[id number]] Collected:  07/01/18 0603    Specimen:  Serum Updated:  07/01/18 1131     HIV Ag/Ab Combo NEG    Complete Blood Count with Differential [[id number]] Collected:  07/01/18 0603    Specimen:  Whole Blood Updated:  07/01/18 0931     WBC Count 5.0 x10E9/L      RBC Count 4.24 x10E12/L      Hemoglobin 13.6 g/dL      Hematocrit 39.8 %      MCV 94 fL      MCH 32.1 pg      MCHC 34.2 g/dL      Platelet Count 209 x10E9/L      Neutrophil Absolute Count 2.68 x10E9/L      Lymphocyte Abs Cnt 1.75 x10E9/L      Monocyte Abs Count 0.38 x10E9/L      Eosinophil Abs Ct 0.15 x10E9/L      Basophil Abs Count 0.04 x10E9/L      Imm Gran, Left Shift 0.01 x10E9/L     Basic Metabolic Panel (NA, K, CL, CO2, BUN, CR, GLU, CA) [[id number]] Collected:  07/01/18 0603    Specimen:  Blood Updated:  07/01/18 0745     Urea Nitrogen, Serum / Plasma 7 mg/dL      Calcium, total, Serum / Plasma 8.8 mg/dL      Chloride, Serum / Plasma 102 mmol/L      Creatinine 0.58 mg/dL      eGFR if non-African American >120 mL/min      eGFR if African Amer >120 mL/min      Potassium, Serum / Plasma 3.7 mmol/L      Glucose, non-fasting 79 mg/dL      Sodium, Serum / Plasma 135 mmol/L      Carbon Dioxide, Total  24 mmol/L      Anion Gap 9    Thyroid Stimulating Hormone [[id number]] Collected:  07/01/18 0603    Specimen:  Blood Updated:  07/01/18 0706    Peripheral Blood Culture [[id number]] Collected:  06/30/18 2305    Specimen:  Peripheral Blood Updated:  07/01/18 0555    Peripheral Blood Culture [[id number]] Collected:  06/30/18 2258    Specimen:  Peripheral Blood Updated:  07/01/18 0554    Anti-Nuclear Antibodies [[id number]] Collected:  06/30/18 2300    Specimen:  Serum Updated:  06/30/18 2328    Respiratory Viral Panel PCR [[id number]] Collected:  06/30/18 1330    Specimen:  Nasopharyngeal Swab Updated:  06/30/18 2304     Influenza A RNA Not detected     Influenza A (H1) RNA Not detected     Influenza A (H3) RNA Not detected     Influenza B RNA Not detected     RSV A RNA Not detected     RSV B RNA Not detected     Parainfluenza 1 RNA Not detected     Parainfluenza 2 RNA Not detected     Parainfluenza 3 RNA Not detected     Rhinovirus RNA Not detected     Metapneumovirus RNA Not detected     Adenovirus DNA Not detected     COMMENTS --     Reference value for all analytes: Not Detected.   (Results reported to [facility])      Rapid Influenza A/B/RSV PCR [[id number]] Collected:  06/30/18 1330    Specimen:  Nasopharyngeal Swab Updated:  06/30/18 1903     Influenza A RNA Not detected  Influenza B RNA Not detected     RSV RNA Not detected     Comments --     Reference value for all analytes: Not Detected.  Negative test results do not rule out respiratory viral infection.   (Results reported to [facility])      Blood Smear Morphology [[id number]] Collected:  06/30/18 1018    Specimen:  Whole Blood Updated:  06/30/18 1439     Diff Comment See Note    Creatine kinase, total [[id number]]  (Abnormal) Collected:  06/30/18 1018    Specimen:  Blood Updated:  06/30/18 1310     Creatine kinase, total 305 U/L     Lactate Dehydrogenase, Serum / Plasma [[id number]] Collected:  06/30/18  1018    Specimen:  Blood Updated:  06/30/18 1310     Lactate Dehydrogenase, Serum / Plasma 175 U/L     Thyroid Stimulating Hormone [[id number]] Collected:  06/30/18 1018    Specimen:  Blood Updated:  06/30/18 1257    C-Reactive Protein [[id number]] Collected:  06/30/18 1018    Specimen:  Serum Updated:  06/30/18 1257     C-Reactive Protein 1.6 mg/L     Sedimentation Rate [[id number]] Collected:  06/30/18 1018    Specimen:  Whole Blood Updated:  06/30/18 1257     Sedimentation Rate (MB) 36 mm/h     Complete Blood Count with Differential [[id number]] Collected:  06/30/18 1018    Specimen:  Whole Blood Updated:  06/30/18 1257     WBC Count 5.8 x10E9/L      RBC Count 4.73 x10E12/L      Hemoglobin 15.0 g/dL      Hematocrit 44.5 %      MCV 94 fL      MCH 31.7 pg      MCHC 33.7 g/dL      Platelet Count 222 x10E9/L      Neutrophil Absolute Count 3.42 x10E9/L      Lymphocyte Abs Cnt 1.91 x10E9/L      Monocyte Abs Count 0.30 x10E9/L      Eosinophil Abs Ct 0.12 x10E9/L      Basophil Abs Count 0.05 x10E9/L      Imm Gran, Left Shift 0.02 x10E9/L     Comprehensive Metabolic Panel (BMP, AST, ALT, T.BILI, ALKP, TP, ALB) [[id number]]  (Abnormal) Collected:  06/30/18 1018    Specimen:  Blood Updated:  06/30/18 1257     Albumin, Serum / Plasma 4.1 g/dL      Alkaline Phosphatase 53 U/L      Alanine transaminase 16 U/L      Aspartate transaminase 25 U/L      Bilirubin, Total 0.7 mg/dL      Urea Nitrogen, Serum / Plasma 5 mg/dL      Calcium, total, Serum / Plasma 9.4 mg/dL      Chloride, Serum / Plasma 102 mmol/L      Creatinine 0.62 mg/dL      eGFR if non-African American >120 mL/min      eGFR if African Amer >120 mL/min      Potassium, Serum / Plasma 3.5 mmol/L      Sodium, Serum / Plasma 137 mmol/L      Protein, Total, Serum / Plasma 8.0 g/dL      Carbon Dioxide, Total 26 mmol/L      Anion Gap 9     Glucose, non-fasting 90 mg/dL           UA  No results found  in last 7 days  Microscopy:  No results found in last 7 days    Microbiology   Microbiology Results (last 72 hours)     Procedure Component Value Units Date/Time    Peripheral Blood Culture [[id number]] Collected:  06/30/18 2305    Order Status:  Sent Specimen:  Peripheral Blood Updated:  07/01/18 0555    Peripheral Blood Culture [[id number]] Collected:  06/30/18 2258    Order Status:  Sent Specimen:  Peripheral Blood Updated:  07/01/18 0554    Respiratory Viral Panel PCR [[id number]] Collected:  06/30/18 1330    Order Status:  Completed Specimen:  Nasopharyngeal Swab Updated:  06/30/18 2304     Influenza A RNA Not detected     Influenza A (H1) RNA Not detected     Influenza A (H3) RNA Not detected     Influenza B RNA Not detected     RSV A RNA Not detected     RSV B RNA Not detected     Parainfluenza 1 RNA Not detected     Parainfluenza 2 RNA Not detected     Parainfluenza 3 RNA Not detected     Rhinovirus RNA Not detected     Metapneumovirus RNA Not detected     Adenovirus DNA Not detected     COMMENTS --     Reference value for all analytes: Not Detected.   (Results reported to [facility])      MRSA Culture [[id number]]     Order Status:  Sent Specimen:  Anterior Nares Swab     Rapid Influenza A/B/RSV PCR [[id number]] Collected:  06/30/18 1330    Order Status:  Completed Specimen:  Nasopharyngeal Swab Updated:  06/30/18 1903     Influenza A RNA Not detected     Influenza B RNA Not detected     RSV RNA Not detected     Comments --     Reference value for all analytes: Not Detected.  Negative test results do not rule out respiratory viral infection.   (Results reported to [facility])          Radiology   Xr Chest 2 Views Pa And Lateral    Result Date: 06/30/2018  FINDINGS/IMPRESSION: Normal cardiac and mediastinal silhouettes.  Lungs appear clear. Report dictated by: Aretta Nip, MD, signed by: Aretta Nip, MD Department of Radiology and Biomedical Imaging    I spoke with Oncology re: the care of this patient.     Problem-based Assessment & Plan    [name] is a 28 yo F hx Hodgkin's Lymphoma s/p tx 6 cycles A-AVD 2018 tx in [place] p/w 3 months of constitutional symptoms and new lymphadenopathy c/f relapsed lymphoma.     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     Dx  -Whole body PET-CT ordered; NPO at midnight; plan for transfer to [facility] for appointment at 1pm tomorrow.   -hepatitis B sAg (neg), Hepatitis B sAb (c/w immunity), Hepatitis B core Ab (neg), Hepatitis C Ab (neg), and HIV (neg)    -Will need core/excisional biopsy of most active lesion (and accesible one), can likely be discharged after  Tx  - Appreciate heme onc recs, considering salvage chemo followed by autoHCT  - Patient would like treatment in [place] (Rather than [place]), so will need to establish care here.     #Arthralgias  #Fevers/Chills:  Likely related to relapsed HL. Also could consider occult infection or rheum process.  -rapid flu neg  -RVP neg  -ANA, TSH, BCx pending    #Hx Asthma  -continue home albuterol prn    Inpatient Checklist  Code Status: FULL    (Code Status confirmed? No  )   Discharge planning: when work-up complete.     DVT prophylaxis: not indicated   Access: PIV's  Diet: Regular Diet    Bowel regimen: N/A      No Foley   Delirium:   AWOL: (!) 2   Nu-DESC: 0    Order Set ordered (recommend ordering if AWOL or Nu-DESC >= 2): No    Nutritional Assessment (if available):                         Severity of Illness  Life threatening disease due to lymphoma    Samuella Cota, MD

## 2018-07-01 NOTE — Interdisciplinary (Signed)
CM note:    PET SCAN @ [facility] schedule for 1/29 at 1:00pm, pick up from [facility] is at [facility].  [X]  confirmed PET Scan schedule with Quillian Quince at [facility] [phone]  [X]  [facility] transport RN booked by Gwyndolyn Saxon on the transport team  [X]  [facility] BLS [phone] booked for Noon pick up and transport to [address] [place]  [X]  Pt notified by MD.    Lelon Mast BSN, RN  Case Management Supervisor  O: [phone]  C: [phone]

## 2018-07-02 DIAGNOSIS — C8190 Hodgkin lymphoma, unspecified, unspecified site: Secondary | ICD-10-CM

## 2018-07-02 DIAGNOSIS — R61 Generalized hyperhidrosis: Secondary | ICD-10-CM

## 2018-07-02 LAB — BASIC METABOLIC PANEL (NA, K,
Anion Gap: 6 (ref 4–14)
Calcium, total, Serum / Plasma: 9.2 mg/dL (ref 8.8–10.3)
Carbon Dioxide, Total: 24 mmol/L (ref 22–32)
Chloride, Serum / Plasma: 107 mmol/L (ref 97–108)
Creatinine: 0.61 mg/dL (ref 0.44–1.00)
Glucose, non-fasting: 93 mg/dL (ref 70–199)
Potassium, Serum / Plasma: 3.8 mmol/L (ref 3.5–5.1)
Sodium, Serum / Plasma: 137 mmol/L (ref 135–145)
Urea Nitrogen, Serum / Plasma: 7 mg/dL (ref 6–22)
eGFR - high estimate: >120 mL/min (ref >60)
eGFR - low estimate: >120 mL/min (ref >60)

## 2018-07-02 LAB — COMPLETE BLOOD COUNT WITH DIFF
Abs Basophils: 0.04 10*9/L (ref 0.0–0.1)
Abs Eosinophils: 0.19 10*9/L (ref 0.0–0.4)
Abs Imm Granulocytes: 0 10*9/L (ref <0.1)
Abs Lymphocytes: 1.78 10*9/L (ref 1.0–3.4)
Abs Monocytes: 0.38 10*9/L (ref 0.2–0.8)
Abs Neutrophils: 3.02 10*9/L (ref 1.8–6.8)
Hematocrit: 40.5 % (ref 36–46)
Hemoglobin: 13.7 g/dL (ref 12.0–15.5)
MCH: 31.7 pg (ref 26–34)
MCHC: 33.8 g/dL (ref 31–36)
MCV: 94 fL (ref 80–100)
Platelet Count: 208 10*9/L (ref 140–450)
RBC Count: 4.32 10*12/L (ref 4.0–5.2)
WBC Count: 5.4 10*9/L (ref 3.4–10)

## 2018-07-02 LAB — THYROID STIMULATING HORMONE: Thyroid Stimulating Hormone: 0.82 mIU/L (ref 0.45–4.12)

## 2018-07-02 LAB — ANTI-NUCLEAR ANTIBODIES: Anti-Nuclear Antibodies: <40 (ref <40)

## 2018-07-02 MED FILL — ACETAMINOPHEN 325 MG TABLET: 325 mg | ORAL | Qty: 2

## 2018-07-02 NOTE — Discharge Instructions (Signed)
Dear Debbie Grant,    You were admitted for night sweats and fatigue. In the hospital, we did a PET scan, which showed that your Hodgkin's disease has NOT come back. We also tested you for infection, thyroid issues, and autoimmune issues, all of which were OK.   You should follow with your PCP to understand why you have fatigue and night sweats.     Key information for you to know:       FOLLOW-UP INSTRUCTIONS:  - Follow-up appointments with your outpatient providers are listed below.     RETURN INSTRUCTIONS:  - Please contact a healthcare provider or return to the emergency room for:    Fever greater than 38o C or 100.4o F and/or shaking chills    Shortness of breath    Severe cough    Bleeding that does not stop on its own   Nausea/vomiting that does not get better with nausea medications   Inability to take oral medications   3 or more loose or liquid stools per day    It was a pleasure taking care of you.      Sincerely,    Dr. Caleen Essex Offie Waide, MD -  Attending

## 2018-07-02 NOTE — Progress Notes (Signed)
HOSPITAL MEDICINE PROGRESS NOTE - ATTENDING ONLY     My date of service is 07/02/18    24 Hour Course/Overnight Events  NAE    Subjective/Review of Systems  Ongoing night sweats overnight  Noticed bruising on right arm     Vitals  Temp:  [36 C (96.8 F)-36.5 C (97.7 F)] 36.5 C (97.7 F)  Heart Rate:  [61-83] 61  BP: (111-128)/(73-85) 128/85  *Resp:  [16] 16  SpO2:  [97 %-100 %] 97 %  O2 Device: None (Room air)    Physical Exam  Constitutional:       Appearance: Normal appearance. She is normal weight.   HENT:      Head: Normocephalic and atraumatic.      Right Ear: External ear normal.      Left Ear: External ear normal.      Nose: Nose normal.      Mouth/Throat:      Mouth: Mucous membranes are moist.      Pharynx: Oropharynx is clear. No oropharyngeal exudate or posterior oropharyngeal erythema.   Eyes:      Extraocular Movements: Extraocular movements intact.      Pupils: Pupils are equal, round, and reactive to light.   Neck:      Musculoskeletal: Normal range of motion and neck supple. No neck rigidity.   Cardiovascular:      Rate and Rhythm: Normal rate and regular rhythm.      Pulses: Normal pulses.      Heart sounds: Normal heart sounds. No murmur.   Pulmonary:      Effort: Pulmonary effort is normal. No respiratory distress.      Breath sounds: No wheezing.   Abdominal:      General: Bowel sounds are normal.      Palpations: Abdomen is soft.      Tenderness: There is tenderness.      Comments: Mild RLQ ttp   Musculoskeletal: Normal range of motion.         General: No swelling or tenderness.   Lymphadenopathy:      Cervical: No cervical adenopathy.   Skin:     General: Skin is warm and dry.   Neurological:      General: No focal deficit present.      Mental Status: She is alert and oriented to person, place, and time.   Psychiatric:         Mood and Affect: Mood normal.         Behavior: Behavior normal.     Data  I have reviewed pertinent labs, microbiology, radiology studies, EKGs, and telemetry as  relevant.    Last Lab Results     Procedure Component Value Units Date/Time    Basic Metabolic Panel (NA, K, CL, CO2, BUN, CR, GLU, CA) [[id number]] Collected:  07/02/18 0508    Specimen:  Blood Updated:  07/02/18 0627     Urea Nitrogen, Serum / Plasma 7 mg/dL      Calcium, total, Serum / Plasma 9.2 mg/dL      Chloride, Serum / Plasma 107 mmol/L      Creatinine 0.61 mg/dL      eGFR if non-African American >120 mL/min      eGFR if African Amer >120 mL/min      Potassium, Serum / Plasma 3.8 mmol/L      Glucose, non-fasting 93 mg/dL      Sodium, Serum / Plasma 137 mmol/L      Carbon Dioxide, Total 24  mmol/L      Anion Gap 6    Complete Blood Count with Differential [[id number]] Collected:  07/02/18 0508    Specimen:  Whole Blood Updated:  07/02/18 0553     WBC Count 5.4 x10E9/L      RBC Count 4.32 x10E12/L      Hemoglobin 13.7 g/dL      Hematocrit 40.5 %      MCV 94 fL      MCH 31.7 pg      MCHC 33.8 g/dL      Platelet Count 208 x10E9/L      Neutrophil Absolute Count 3.02 x10E9/L      Lymphocyte Abs Cnt 1.78 x10E9/L      Monocyte Abs Count 0.38 x10E9/L      Eosinophil Abs Ct 0.19 x10E9/L      Basophil Abs Count 0.04 x10E9/L      Imm Gran, Left Shift 0.00 x10E9/L     Peripheral Blood Culture [[id number]] Collected:  06/30/18 2305    Specimen:  Peripheral Blood Updated:  07/02/18 0543     Peripheral Blood Culture No growth at 23 hours.    Peripheral Blood Culture [[id number]] Collected:  06/30/18 2258    Specimen:  Peripheral Blood Updated:  07/02/18 0543     Peripheral Blood Culture No growth at 23 hours.    Anti-Nuclear Antibodies [[id number]] Collected:  06/30/18 2300    Specimen:  Serum Updated:  07/01/18 1703     Anti-Nuclear Antibodies <40    Thyroid Stimulating Hormone [[id number]] Collected:  07/01/18 0603    Specimen:  Blood Updated:  07/01/18 1236     Thyroid Stimulating Hormone 0.94 mIU/L     Hepatitis C Antibody [[id number]] Collected:  07/01/18 0603    Specimen:  Serum Updated:  07/01/18 1203     Hepatitis C  Antibody NEG    anti-HBs (Hepatitis B Surface Antibody, Quantitative) [[id number]] Collected:  07/01/18 0603    Specimen:  Serum Updated:  07/01/18 1203     Hepatitis B Surface Antibody, Quantitative 790 mIU/mL     Hepatitis B Surface Antigen [[id number]] Collected:  07/01/18 0603    Specimen:  Serum Updated:  07/01/18 1201     Hepatitis B Surface Antigen NEG    Hepatitis B Core Antibody, Total [[id number]] Collected:  07/01/18 0603    Specimen:  Serum Updated:  07/01/18 1201     Hepatitis B Core Antibody, Total NEG    HIV Ag/Ab Combo [[id number]] Collected:  07/01/18 0603    Specimen:  Serum Updated:  07/01/18 1131     HIV Ag/Ab Combo NEG          UA  No results found in last 7 days  Microscopy:  No results found in last 7 days    Microbiology  Microbiology Results (last 72 hours)     Procedure Component Value Units Date/Time    Peripheral Blood Culture [[id number]] Collected:  06/30/18 2305    Order Status:  Completed Specimen:  Peripheral Blood Updated:  07/02/18 0543     Peripheral Blood Culture No growth at 23 hours.    Peripheral Blood Culture [[id number]] Collected:  06/30/18 2258    Order Status:  Completed Specimen:  Peripheral Blood Updated:  07/02/18 0543     Peripheral Blood Culture No growth at 23 hours.    Respiratory Viral Panel PCR [[id number]] Collected:  06/30/18 1330    Order Status:  Completed Specimen:  Nasopharyngeal Swab Updated:  06/30/18 2304  Influenza A RNA Not detected     Influenza A (H1) RNA Not detected     Influenza A (H3) RNA Not detected     Influenza B RNA Not detected     RSV A RNA Not detected     RSV B RNA Not detected     Parainfluenza 1 RNA Not detected     Parainfluenza 2 RNA Not detected     Parainfluenza 3 RNA Not detected     Rhinovirus RNA Not detected     Metapneumovirus RNA Not detected     Adenovirus DNA Not detected     COMMENTS --     Reference value for all analytes: Not Detected.   (Results reported to [place])      MRSA Culture  [[id number]]     Order Status:  Sent Specimen:  Anterior Nares Swab     Rapid Influenza A/B/RSV PCR [[id number]] Collected:  06/30/18 1330    Order Status:  Completed Specimen:  Nasopharyngeal Swab Updated:  06/30/18 1903     Influenza A RNA Not detected     Influenza B RNA Not detected     RSV RNA Not detected     Comments --     Reference value for all analytes: Not Detected.  Negative test results do not rule out respiratory viral infection.   (Results reported to [facility])          Radiology   Xr Chest 2 Views Pa And Lateral    Result Date: 06/30/2018  FINDINGS/IMPRESSION: Normal cardiac and mediastinal silhouettes.  Lungs appear clear. Report dictated by: Aretta Nip, MD, signed by: Aretta Nip, MD Department of Radiology and Biomedical Imaging    I spoke with Oncology re: the care of this patient.    Problem-based Assessment & Plan    Ms Altidor is a 28 yo F hx Hodgkin's Lymphoma s/p tx 6 cycles A-AVD 2018 tx in [place] p/w 3 months of constitutional symptoms and new lymphadenopathy c/f relapsed lymphoma.     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     Dx  -Whole body PET-CT ordered; NPO at midnight; plan for transfer to [facility] for appointment at 1pm today   -hepatitis B sAg (neg), Hepatitis B sAb (c/w immunity), Hepatitis B core Ab (neg), Hepatitis C Ab (neg), and HIV (neg)    -Will need core/excisional biopsy of most active lesion (and accesible one), can likely be discharged after  Tx  - Appreciate heme onc recs, considering salvage chemo followed by autoHCT  - Patient would like treatment in [place] (Rather than [place]), so will need to establish care here.     #Arthralgias  #Fevers/Chills:  Likely related to relapsed HL. Also could consider occult infection or rheum process.  -rapid flu neg  -RVP neg  -ANA wnl, TSH wnl, BCx NGTD    #Hx Asthma  -continue home albuterol  prn    Inpatient Checklist  Code Status: FULL   Discharge planning: when work-up complete.     DVT prophylaxis: not indicated   Access: PIV's  Diet: NPO Except Meds w Sips of Water Effective Midnight    Bowel regimen: N/A      No Foley   Delirium:   AWOL: (!) 2   Nu-DESC: 0    Order Set ordered (recommend ordering if AWOL or Nu-DESC >= 2):  No    Nutritional Assessment (if available):                         Severity of Illness  Life threatening disease due to lymphoma    Samuella Cota, MD

## 2018-07-02 NOTE — Consults (Addendum)
HEMATOLOGY ONCOLOGY FOLLOW UP CONSULT NOTE     Follow-Up History  PET/CT today shows no evidence of hypermetabolic malignancy. No lymphadenopathy     Subjective  Ongoing night sweats    Vitals  Temp:  [36 C (96.8 F)-36.5 C (97.7 F)] 36.4 C (97.5 F)  Heart Rate:  [61-75] 75  *Resp:  [16-18] 18  BP: (111-137)/(77-98) 137/98  SpO2:  [97 %-100 %] 98 %       Scheduled Meds:   sodium chloride flush  3 mL Intravenous Q8H [place]     Continuous Infusions:  PRN Meds:   sodium chloride flush  3 mL Intravenous PRN    acetaminophen  650 mg Oral Q6H PRN    albuterol  1 puff Inhalation Q6H PRN    ondansetron  4 mg Oral Q6H PRN       Data    CBC        07/02/18  0508   WBC 5.4   HGB 13.7   HCT 40.5   PLT 208     Coags  No results found in last 36 hours    Chem7        07/02/18  0508   NA 137   K 3.8   CL 107   CO2 24   BUN 7   CREAT 0.61   GLU 93     Electrolytes        07/02/18  0508   CA 9.2       Radiology Results   Petct Fdg Vertex To Mid Thigh (non Diagnostic Ct)    Result Date: 07/02/2018  No evidence of hypermetabolic malignancy. No lymphadenopathy. This study was reviewed by Dr. Charlsie Quest of Nuclear Medicine. Report dictated by: Sinclair Grooms, MD, signed by: Gerre Scull, MD PhD Department of Radiology and Biomedical Imaging    Assessment and Recommendations    Debbie Grant is a 28 y.o. female hx Hodgkin's Lymphoma s/p tx 1 cycle of ABVD and 5 cyclesA-AVDcompleted in Oct 2018 tx in [place] with CR  p/w 3 months of constitutional symptoms and new lymphadenopathy c/f relapsed lymphoma.     Her PET/CT today reassuringly shows no lymphadenopathy and no FDG avid nodes, making recurrent HL very unlikely. Unclear at this time the etiology of her night sweats but recommend full infectious work-up which has thus far been negative and autoimmune work-up.    We will have her follow-up in outpatient hematology clinic with one of the [facility] providers to continue to monitor her  given her recent treatment for HL. Please place outpatient hematology referral.    We will sign off at this time. Please page [phone] with any additional questions.    Code Status: FULL    Kem Kays, MD  Heme/Onc Fellow  07/02/2018

## 2018-07-02 NOTE — Discharge Summary (Signed)
[place]     Patient Name: Debbie Grant  Patient MRN: [id number]  Date of Birth: 08-25-1990    Facility: [facility]  Attending Physician: Caleen Essex Ajahni Nay, MD  Office Phone: [phone]    Date of Admission: 06/30/2018  Date of Discharge: 07/02/2018    Admission Diagnosis: Lymphadenopathy [R59.1]  Night sweats [R61]  Discharge Diagnosis: lymphadenopathy, history of Hodgkin disease [facility])    Discharge Disposition: Home    My date of service is 07/02/2018.    History (with Chief Complaint)    Debbie Grant is a 28 yo F hx NHL s/p tx 6 cycles A-AVD 2018 tx in [place] (available in care everywhere) p/w several months of increasing fatigue, nightsweats, and right inguinal lymphadenopathy concerning for a relapse of lymphoma.    The patient noted earlier satiety and itchy skin for past three months, increasing joint pain, fatigue, loss of appetite, and hot sweats for the past month. This month she went to urgent care due to dry cough and SOB. She completed a 10 day course of amox on Saturday. Cough is improved but still w/ constitutional symptoms, so presented to ED for c/f lymphoma.    Patient has been trying to establish w/ [facility] as an outpatient but unable to due to insurance / other issues.   Otherwise feeling well.    Brief Hospital Course by Problem    Debbie Grant is a46 yo F hx Hodgkin's Lymphoma s/p tx 6 cyclesA-AVD2018 tx in [place] p/w 3 months of constitutional symptoms and new lymphadenopathy c/f relapsed lymphoma. PET negative.     #HL: Stage IVbs/p tx 6 cyclesA-AVD (Oct2018 tx in [place]), now p/w concern for relapse with b-symptoms x3 months.  Patient underwent whole body PET-CT  Which was negative for LAD.   - Discharge referral to heme/onc placed, Oncology will work on follow up in next 1-2 months to establish care in [facility].       #Arthralgias  #Fevers/Chills: Unclear etiology, although no documented fevers during hospital stay. Blood  cultures NGTD x48hours, rapid flu neg RVP neg ANA wnl, TSH wnl  - Patient to follow-up with PCP next week to continue work-up. Has appt on 07/08/2018.     #Hx Asthma  -continue home albuterol prn    Physical Exam at Discharge  BP (!) 137/98 (BP Location: Left upper arm, Patient Position: Lying)   Pulse 75   Temp 36.4 C (97.5 F) (Oral)   Resp 18   Ht 157.5 cm ('5\' 2"'$ )   Wt 82.5 kg (181 lb 14.1 oz)   LMP 05/11/2018   SpO2 98%   Breastfeeding No   BMI 33.27 kg/m       Intake/Output Summary (Last 24 hours) at 07/02/2018 1818  Last data filed at 07/02/2018 1736  Gross per 24 hour   Intake 830 ml   Output 800 ml   Net 30 ml       Physical Exam  Constitutional:   Appearance: Normal appearance. She isnormal weight.   HENT:   Head: Normocephalicand atraumatic.   Right Ear: External earnormal.   Left Ear: External earnormal.   Nose: Nose normal.   Mouth/Throat:   Mouth: Mucous membranes are moist.   Pharynx: Oropharynx is clear. Nooropharyngeal exudateor posterior oropharyngeal erythema.   Eyes:   Extraocular Movements: Extraocular movements intact.   Pupils: Pupils are equal, round, and reactive to light.   Neck:   Musculoskeletal: Normal range of motionand neck supple. Noneck  rigidity.   Cardiovascular:   Rate and Rhythm: Normal rateand regular rhythm.   Pulses: Normal pulses.   Heart sounds: Normal heart sounds.No murmur.   Pulmonary:   Effort: Pulmonary effort is normal. Norespiratory distress.   Breath sounds: No wheezing.   Abdominal:   General: Bowel sounds are normal.   Palpations: Abdomen is soft.   Tenderness: There is tenderness.   Comments: Mild RLQ ttp  Musculoskeletal:Normal range of motion.   General: No swellingor tenderness.   Lymphadenopathy:   Cervical: No cervical adenopathy.   Skin:  General: Skin is warmand dry.   Neurological:   General: No focal deficitpresent.   Mental Status: She is alertand  oriented to person, place, and time.   Psychiatric:   Mood and Affect: Moodnormal.   Behavior: Behaviornormal.     Relevant Labs, Radiology, and Other Studies  Lab Results (last 72 hours)     Procedure Component Value Units Date/Time    Thyroid Stimulating Hormone [[id number]] Collected:  06/30/18 1018    Order Status:  Completed Specimen:  Blood Updated:  07/02/18 1444     Thyroid Stimulating Hormone 0.82 mIU/L      Comment: Note: These TSH test results are flagged according to reference intervals for non-pregnant patients. In pregnant patients, the upper reference limit is 2.5 mIU/L in the first trimester, 3.0 mIU/L in the second trimester, and 3.5 mIU/L in the third   trimester. The lower reference limit for TSH in pregnancy is 0.1 - 0.2 mIU/L lower than the limit for non-pregnant subjects.         Basic Metabolic Panel (NA, K, CL, CO2, BUN, CR, GLU, CA) [[id number]] Collected:  07/02/18 0508    Order Status:  Completed Specimen:  Blood Updated:  07/02/18 0627     Urea Nitrogen, Serum / Plasma 7 mg/dL      Calcium, total, Serum / Plasma 9.2 mg/dL      Chloride, Serum / Plasma 107 mmol/L      Creatinine 0.61 mg/dL      eGFR if non-African American >120 mL/min      eGFR if African Amer >120 mL/min      Comment: Calculated using the CKD-EPI (2009) equation.                                            eGFR corrected for 1.73 sq meters body surface area                                            NOTE: eGFR is only an estimation. Please see on-line Lab Manual for potential limitations          Potassium, Serum / Plasma 3.8 mmol/L      Glucose, non-fasting 93 mg/dL      Sodium, Serum / Plasma 137 mmol/L      Carbon Dioxide, Total 24 mmol/L      Anion Gap 6     Comment: Anion gap is calculated as Na-(Cl+CO2)       Complete Blood Count with Differential [[id number]] Collected:  07/02/18 0508    Order Status:  Completed Specimen:  Whole Blood Updated:  07/02/18 0553     WBC Count 5.4 x10E9/L      RBC Count 4.32  x10E12/L      Hemoglobin 13.7 g/dL      Hematocrit 40.5 %      MCV 94 fL      MCH 31.7 pg      MCHC 33.8 g/dL      Platelet Count 208 x10E9/L      Neutrophil Absolute Count 3.02 x10E9/L      Lymphocyte Abs Cnt 1.78 x10E9/L      Monocyte Abs Count 0.38 x10E9/L      Eosinophil Abs Ct 0.19 x10E9/L      Basophil Abs Count 0.04 x10E9/L      Imm Gran, Left Shift 0.00 x10E9/L     Anti-Nuclear Antibodies [[id number]] Collected:  06/30/18 2300    Order Status:  Completed Specimen:  Serum Updated:  07/01/18 1703     Anti-Nuclear Antibodies <40     Comment: by indirect immunofluorescence       Thyroid Stimulating Hormone [[id number]] Collected:  07/01/18 0603    Order Status:  Completed Specimen:  Blood Updated:  07/01/18 1236     Thyroid Stimulating Hormone 0.94 mIU/L      Comment: Note: These TSH test results are flagged according to reference intervals for non-pregnant patients. In pregnant patients, the upper reference limit is 2.5 mIU/L in the first trimester, 3.0 mIU/L in the second trimester, and 3.5 mIU/L in the third   trimester. The lower reference limit for TSH in pregnancy is 0.1 - 0.2 mIU/L lower than the limit for non-pregnant subjects.         Hepatitis C Antibody [[id number]] Collected:  07/01/18 0603    Order Status:  Completed Specimen:  Serum Updated:  07/01/18 1203     Hepatitis C Antibody NEG    anti-HBs (Hepatitis B Surface Antibody, Quantitative) [[id number]] Collected:  07/01/18 0603    Order Status:  Completed Specimen:  Serum Updated:  07/01/18 1203     Hepatitis B Surface Antibody, Quantitative 790 mIU/mL      Comment: Reference Range:  < 8 mIU/mL: Not Immune  8-11 mIU/mL: Equivocal, questionable immunity  12 mIU/mL or greater: Immune         Hepatitis B Surface Antigen [[id number]] Collected:  07/01/18 0603    Order Status:  Completed Specimen:  Serum Updated:  07/01/18 1201     Hepatitis B Surface Antigen NEG    Hepatitis B Core Antibody, Total [[id number]] Collected:  07/01/18 0603    Order Status:   Completed Specimen:  Serum Updated:  07/01/18 1201     Hepatitis B Core Antibody, Total NEG    HIV Ag/Ab Combo [[id number]] Collected:  07/01/18 0603    Order Status:  Completed Specimen:  Serum Updated:  07/01/18 1131     HIV Ag/Ab Combo NEG     Comment: (Reported by Lab to [employer]. Physician reporting also required.)       Complete Blood Count with Differential [[id number]] Collected:  07/01/18 0603    Order Status:  Completed Specimen:  Whole Blood Updated:  07/01/18 0931     WBC Count 5.0 x10E9/L      RBC Count 4.24 x10E12/L      Hemoglobin 13.6 g/dL      Hematocrit 39.8 %      MCV 94 fL      MCH 32.1 pg      MCHC 34.2 g/dL      Platelet Count 209 x10E9/L      Neutrophil Absolute Count 2.68 x10E9/L      Lymphocyte Abs  Cnt 1.75 x10E9/L      Monocyte Abs Count 0.38 x10E9/L      Eosinophil Abs Ct 0.15 x10E9/L      Basophil Abs Count 0.04 x10E9/L      Imm Gran, Left Shift 0.01 x10E9/L     Basic Metabolic Panel (NA, K, CL, CO2, BUN, CR, GLU, CA) [[id number]] Collected:  07/01/18 0603    Order Status:  Completed Specimen:  Blood Updated:  07/01/18 0745     Urea Nitrogen, Serum / Plasma 7 mg/dL      Calcium, total, Serum / Plasma 8.8 mg/dL      Chloride, Serum / Plasma 102 mmol/L      Creatinine 0.58 mg/dL      eGFR if non-African American >120 mL/min      eGFR if African Amer >120 mL/min      Comment: Calculated using the CKD-EPI (2009) equation.                                            eGFR corrected for 1.73 sq meters body surface area                                            NOTE: eGFR is only an estimation. Please see on-line Lab Manual for potential limitations          Potassium, Serum / Plasma 3.7 mmol/L      Glucose, non-fasting 79 mg/dL      Sodium, Serum / Plasma 135 mmol/L      Carbon Dioxide, Total 24 mmol/L      Anion Gap 9     Comment: Anion gap is calculated as Na-(Cl+CO2)       Blood Smear Morphology [[id number]] Collected:  06/30/18 1018    Order Status:  Completed Specimen:  Whole Blood  Updated:  06/30/18 1439     Diff Comment See Note     Comment: Platelet morphology unremarkable  RBC morphology unremarkable  WBC morphology unremarkable         Creatine kinase, total [[id number]]  (Abnormal) Collected:  06/30/18 1018    Order Status:  Completed Specimen:  Blood Updated:  06/30/18 1310     Creatine kinase, total 305 U/L     Lactate Dehydrogenase, Serum / Plasma [[id number]] Collected:  06/30/18 1018    Order Status:  Completed Specimen:  Blood Updated:  06/30/18 1310     Lactate Dehydrogenase, Serum / Plasma 175 U/L     C-Reactive Protein [[id number]] Collected:  06/30/18 1018    Order Status:  Completed Specimen:  Serum Updated:  06/30/18 1257     C-Reactive Protein 1.6 mg/L      Comment: This CRP assay is a high sensitivity method that can be used for assessment of cardiovascular risk as well as for assessment of inflammation associated with other clinical conditions.                                            TERTILE  CRP MG/L   CARDIOVASCULAR RISK  ---------------------------------------     1       <1.0  LOW     2      1.0-3.0     MODERATE     3       >3.0       HIGH  ---------------------------------------         Sedimentation Rate [[id number]] Collected:  06/30/18 1018    Order Status:  Completed Specimen:  Whole Blood Updated:  06/30/18 1257     Sedimentation Rate (MB) 36 mm/h     Complete Blood Count with Differential [[id number]] Collected:  06/30/18 1018    Order Status:  Completed Specimen:  Whole Blood Updated:  06/30/18 1257     WBC Count 5.8 x10E9/L      RBC Count 4.73 x10E12/L      Hemoglobin 15.0 g/dL      Hematocrit 44.5 %      MCV 94 fL      MCH 31.7 pg      MCHC 33.7 g/dL      Platelet Count 222 x10E9/L      Neutrophil Absolute Count 3.42 x10E9/L      Lymphocyte Abs Cnt 1.91 x10E9/L      Monocyte Abs Count 0.30 x10E9/L      Eosinophil Abs Ct 0.12 x10E9/L      Basophil Abs Count 0.05 x10E9/L      Imm Gran, Left Shift 0.02 x10E9/L     Comprehensive Metabolic Panel (BMP, AST, ALT,  T.BILI, ALKP, TP, ALB) [[id number]]  (Abnormal) Collected:  06/30/18 1018    Order Status:  Completed Specimen:  Blood Updated:  06/30/18 1257     Albumin, Serum / Plasma 4.1 g/dL      Alkaline Phosphatase 53 U/L      Alanine transaminase 16 U/L      Aspartate transaminase 25 U/L      Bilirubin, Total 0.7 mg/dL      Urea Nitrogen, Serum / Plasma 5 mg/dL      Calcium, total, Serum / Plasma 9.4 mg/dL      Chloride, Serum / Plasma 102 mmol/L      Creatinine 0.62 mg/dL      eGFR if non-African American >120 mL/min      eGFR if African Amer >120 mL/min      Comment: Calculated using the CKD-EPI (2009) equation.                                            eGFR corrected for 1.73 sq meters body surface area                                            NOTE: eGFR is only an estimation. Please see on-line Lab Manual for potential limitations          Potassium, Serum / Plasma 3.5 mmol/L      Sodium, Serum / Plasma 137 mmol/L      Protein, Total, Serum / Plasma 8.0 g/dL      Carbon Dioxide, Total 26 mmol/L      Anion Gap 9     Comment: Anion gap is calculated as Na-(Cl+CO2)        Glucose, non-fasting 90 mg/dL           Procedures Performed and Complications  none    DISCHARGE INSTRUCTIONS  Discharge Diet  Regular Diet    Functional Assessment at Discharge/Activity Goals  No functional activity limits.    Allergies and Medications at Discharge    Allergies: Patient has no known allergies.    Your Medications at the End of This Hospitalization       Disp Refills Start End    albuterol 90 mcg/actuation metered dose inhaler   10/03/2016     Sig - Route: Inhale 2 puffs into the lungs. - Inhalation    Class: Historical Med    loratadine (CLARITIN) 10 mg tablet        Sig - Route: Take 10 mg by mouth Daily. - Oral    Class: Historical Med            Pending Tests    Order Current Status    Peripheral Blood Culture Preliminary result    Peripheral Blood Culture Preliminary result          Booked [place] Appointments  No future  appointments.    Pending [place] Referrals  [place] Referrals Made (From admission, onward)     Ordered     Start    07/02/18 1805  Discharge Referral to Hematology      07/02/18 0000    07/02/18 1805  Discharge Referral to Hematology      07/02/18 0000                Case Management Services Arranged  Case Management Services Arranged: (all recorded)           Discharge Assessment  Condition at discharge:  good                   Primary Care Physician  Pecolia Ades  Address: [address] [place] / [place] [address]   Phone: [phone]  Fax: [phone]     I spent 60 minutes preparing discharge materials, prescriptions, follow up plans, and face-to-face time with the patient/family discussing discharge planning.    Outside Providers, for pending tests please use the following numbers:   For [place] [facility] - Please Call: [phone]    For [place] [facility] - Please Call: [phone]   For [place] [facility] - Please Call: [phone]    Signed,  Samuella Cota, MD  07/02/2018        Discharge Instructions provided to the patient (if any):    Discharge Instructions     Dear Debbie Grant,    You were admitted for night sweats and fatigue. In the hospital, we did a PET scan, which showed that your Hodgkin's disease has NOT come back. We also tested you for infection, thyroid issues, and autoimmune issues, all of which were OK.   You should follow with your PCP to understand why you have fatigue and night sweats.     Key information for you to know:       FOLLOW-UP INSTRUCTIONS:  - Follow-up appointments with your outpatient providers are listed below.     RETURN INSTRUCTIONS:  - Please contact a healthcare provider or return to the emergency room for:    Fever greater than 38o C or 100.4o F and/or shaking chills    Shortness of breath    Severe cough    Bleeding that does not stop on its own   Nausea/vomiting that does not get better with nausea medications   Inability to take oral  medications   3 or more loose or liquid stools per  day    It was a pleasure taking care of you.      Sincerely,    Dr. Caleen Essex Adrianah Prophete, MD -  Attending             Patient Instructions    None

## 2018-07-07 LAB — PERIPHERAL BLOOD CULTURE
Peripheral Blood Culture: NO GROWTH
Peripheral Blood Culture: NO GROWTH

## 2018-10-01 MED ORDER — ASPIRIN-ACETAMINOPHEN-CAFFEINE 250 MG-250 MG-65 MG TABLET
250-250-65 | ORAL | Status: AC
Start: 2018-10-01 — End: ?

## 2018-10-01 MED ORDER — DIPHENHYDRAMINE 25 MG-ACETAMINOPHEN 500 MG TABLET
25-500 | ORAL | Status: AC
Start: 2018-10-01 — End: ?

## 2018-10-01 MED ORDER — OMEPRAZOLE 20 MG CAPSULE,DELAYED RELEASE
20 | ORAL | Status: AC
Start: 2018-10-01 — End: ?

## 2018-10-01 MED ORDER — HYDROCHLOROTHIAZIDE 25 MG TABLET
25 | ORAL | Status: AC
Start: 2018-10-01 — End: ?

## 2018-10-01 MED ORDER — AMOXICILLIN 875 MG-POTASSIUM CLAVULANATE 125 MG TABLET
875-125 | ORAL | Status: AC
Start: 2018-10-01 — End: ?

## 2018-10-01 MED ORDER — EPINEPHRINE 0.3 MG/0.3 ML INJECTION, AUTO-INJECTOR
0.3 | INTRAMUSCULAR | Status: AC
Start: 2018-10-01 — End: ?

## 2018-10-01 MED ORDER — ASCORBIC ACID (VITAMIN C) ER 500 MG TABLET,EXTENDED RELEASE
500 | ORAL | Status: AC
Start: 2018-10-01 — End: ?

## 2018-10-01 MED ORDER — CYANOCOBALAMIN (VIT B-12) ER 1,000 MCG TABLET,EXTENDED RELEASE
1000 | ORAL | Status: AC
Start: 2018-10-01 — End: ?

## 2018-10-01 MED ORDER — SENNOSIDES 8.6 MG-DOCUSATE SODIUM 50 MG TABLET
ORAL | Status: AC
Start: 2018-10-01 — End: ?

## 2018-10-06 ENCOUNTER — Ambulatory Visit: Admit: 2018-10-06 | Discharge: 2018-10-06 | Payer: BLUE CROSS/BLUE SHIELD | Attending: Medical Oncology

## 2018-10-06 DIAGNOSIS — C8190 Hodgkin lymphoma, unspecified, unspecified site: Secondary | ICD-10-CM

## 2018-10-06 NOTE — Progress Notes (Signed)
ID: Debbie Grant is a 28 y.o. 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      She notes that in November 2017 she developed generalized body aches and flulike symptoms with some subjective fevers chills and night sweats. A primary care physician noted a right axillary lump  and she was treated empirically with Z-Pak for possible infection.  She notes that around Christmas time in 2017 she developed worsening fatigue and generalized body aches a point that she was having difficulties getting through the day. She notes that she had an  autoimmune workup HIV testing which was unrevealing she was noted to have some iron deficiency.    She notes that she had a follow-up ultrasound that showed right axillary lymph node was enlarging and she also had a left axillary lymph node and had noted inguinal lymph nodes in the groin.  Some of her workup was delayed due to insurance issues that she eventually had a left axillary lymph node biopsy on 09/24/2016 accession number [id number] which showed classical Hodgkin's  lymphoma.    Treatment history     => 09/24/16  accession number [id number] Left axillary lymph node biopsy showing classical HL.   => 10/10/2016 PET/CT showed Extensive hypermetabolic adenopathy in the chest, abdomen,  Pelvis and multiple bone lesions, compatible with active lymphoma, maximum standard uptake value measurements up to 19.0 (Deauville 5).  => 10/2016-04/03/17   A-AVD x6  => 12/2016 PET/CT after 3 cycles of treatment showing excellent respose to treatment  04/03/17 PET/CT after 6 cycles of treatment showed no disease greater than DS 2    => 07/02/2018 PETCT at [place]: no e/o malignancy     She was recently living in [place], but she has moved back to [place] in January. She is overall doing very well.   In 05/2018 "got sick" with low energy, night sweats, mild cough, pain between the scapular .  Presented to the ED on 06/29/2018, culture nega, PETCT neg for recurrent dz .     At today's visit, pt states that she feels depressed, emotionally isolated, "some days can not get out the bed".   Her grandma whom she was very close to,  passed away 6 months ago, which contributed to her depression.    Physically, she is doing fine.  Symptoms that she experienced in Jan all resolved.  Has normal mestuation now.     Past Medical History:   Diagnosis Date    NHL (non-Hodgkin's lymphoma) ([place])        Past Surgical History:   Procedure Laterality Date    ARTHROSCOPIC REPAIR ACL         Medications the patient states to be taking prior to today's encounter.   Medication Sig    amoxicillin-clavulanate (AUGMENTIN) 875-125 mg tablet Twice a day    EPINEPHrine (EPIPEN) 0.3 mg/0.3 mL injection Inject into the muscle    hydroCHLOROthiazide (HYDRODIURIL) 25 mg tablet Take 25 mg by mouth    senna-docusate (PERICOLACE) 8.6-50 mg tablet Take 2 tablets by mouth       History reviewed. No pertinent family history.    Review of Systems   Constitutional: Negative.    HENT: Negative.    Eyes: Negative.  Respiratory: Negative.    Cardiovascular: Negative.    Gastrointestinal: Negative.    Genitourinary: Negative.    Musculoskeletal: Negative.    Skin: Negative.    Neurological: Negative.    Endo/Heme/Allergies: Negative.    Psychiatric/Behavioral: Positive for depression.   All other systems reviewed and are negative.      Physical Exam:  BP (!) 140/95   Pulse 66   Temp 36.6 C (97.8 F) (Oral)   Resp 18   Ht 158.5 cm (5' 2.4")   Wt 89.9 kg (198 lb 3.1 oz)   SpO2 99%   BMI 35.78 kg/m   ECOG Performance Status: 0 - Asymptomatic   Physical Exam  Vitals signs reviewed.   Constitutional:       Appearance: Normal appearance.   HENT:      Head:  Normocephalic and atraumatic.      Right Ear: Tympanic membrane and external ear normal.      Left Ear: Tympanic membrane and external ear normal.      Nose: Nose normal.      Mouth/Throat:      Mouth: Mucous membranes are moist.      Pharynx: Oropharynx is clear.   Eyes:      Extraocular Movements: Extraocular movements intact.      Conjunctiva/sclera: Conjunctivae normal.      Pupils: Pupils are equal, round, and reactive to light.   Neck:      Musculoskeletal: Normal range of motion and neck supple.   Cardiovascular:      Rate and Rhythm: Normal rate and regular rhythm.      Pulses: Normal pulses.      Heart sounds: Normal heart sounds.   Pulmonary:      Effort: Pulmonary effort is normal.      Breath sounds: Normal breath sounds.   Abdominal:      General: Abdomen is flat. Bowel sounds are normal.      Palpations: Abdomen is soft.   Musculoskeletal: Normal range of motion.   Lymphadenopathy:      Comments: NO adenopathy in the cervical, axillary and L inguinal areas, shotty nodes R inguinal area    Skin:     General: Skin is warm and dry.   Neurological:      General: No focal deficit present.      Mental Status: She is alert and oriented to person, place, and time. Mental status is at baseline.   Psychiatric:         Behavior: Behavior normal.         Thought Content: Thought content normal.         Judgment: Judgment normal.         Labs:  Results for orders placed or performed during the hospital encounter of 06/30/18   Rapid Influenza A/B/RSV PCR   Result Value Ref Range    Influenza A RNA Not detected     Influenza B RNA Not detected     RSV RNA Not detected     Comments       Reference value for all analytes: Not Detected.  Negative test results do not rule out respiratory viral infection.   (Results reported to [facility])     Respiratory Viral Panel PCR   Result Value Ref Range    Influenza A RNA Not detected     Influenza A (H1) RNA Not detected     Influenza A (H3) RNA Not  detected  Influenza B RNA Not detected     RSV A RNA Not detected     RSV B RNA Not detected     Parainfluenza 1 RNA Not detected     Parainfluenza 2 RNA Not detected     Parainfluenza 3 RNA Not detected     Rhinovirus RNA Not detected     Metapneumovirus RNA Not detected     Adenovirus DNA Not detected     COMMENTS       Reference value for all analytes: Not Detected.   (Results reported to [place])     Peripheral Blood Culture   Result Value Ref Range    Peripheral Blood Culture No growth 6 days.    Peripheral Blood Culture   Result Value Ref Range    Peripheral Blood Culture No growth 6 days.    Comprehensive Metabolic Panel (BMP, AST, ALT, T.BILI, ALKP, TP, ALB)   Result Value Ref Range    Albumin, Serum / Plasma 4.1 3.5 - 4.8 g/dL    Alkaline Phosphatase 53 31 - 95 U/L    Alanine transaminase 16 11 - 50 U/L    Aspartate transaminase 25 17 - 42 U/L    Bilirubin, Total 0.7 0.2 - 1.3 mg/dL    Urea Nitrogen, Serum / Plasma 5 (L) 6 - 22 mg/dL    Calcium, total, Serum / Plasma 9.4 8.8 - 10.3 mg/dL    Chloride, Serum / Plasma 102 97 - 108 mmol/L    Creatinine 0.62 0.44 - 1.00 mg/dL    eGFR if non-African American >120 >60 mL/min    eGFR if African Amer >120 >60 mL/min    Potassium, Serum / Plasma 3.5 3.5 - 5.1 mmol/L    Sodium, Serum / Plasma 137 135 - 145 mmol/L    Protein, Total, Serum / Plasma 8.0 6.0 - 8.4 g/dL    Carbon Dioxide, Total 26 22 - 32 mmol/L    Anion Gap 9 4 - 14    Glucose, non-fasting 90 70 - 199 mg/dL   C-Reactive Protein   Result Value Ref Range    C-Reactive Protein 1.6 <7.5 mg/L   Sedimentation Rate   Result Value Ref Range    Sedimentation Rate (MB) 36 2.0 - 37.0 mm/h   Complete Blood Count with Differential   Result Value Ref Range    WBC Count 5.8 3.4 - 10 x10E9/L    RBC Count 4.73 4.0 - 5.2 x10E12/L    Hemoglobin 15.0 12.0 - 15.5 g/dL    Hematocrit 44.5 36 - 46 %    MCV 94 80 - 100 fL    MCH 31.7 26 - 34 pg    MCHC 33.7 31 - 36 g/dL    Platelet Count 222 140  - 450 x10E9/L    Neutrophil Absolute Count 3.42 1.8 - 6.8 x10E9/L    Lymphocyte Abs Cnt 1.91 1.0 - 3.4 x10E9/L    Monocyte Abs Count 0.30 0.2 - 0.8 x10E9/L    Eosinophil Abs Ct 0.12 0.0 - 0.4 x10E9/L    Basophil Abs Count 0.05 0.0 - 0.1 x10E9/L    Imm Gran, Left Shift 0.02 <0.1 x10E9/L   Creatine kinase, total   Result Value Ref Range    Creatine kinase, total 305 (H) 37 - 241 U/L   Lactate Dehydrogenase, Serum / Plasma   Result Value Ref Range    Lactate Dehydrogenase, Serum / Plasma 175 102 - 199 U/L   Thyroid Stimulating Hormone  Result Value Ref Range    Thyroid Stimulating Hormone 0.82 0.45 - 4.12 mIU/L   Blood Smear Morphology   Result Value Ref Range    Diff Comment See Note    Anti-Nuclear Antibodies   Result Value Ref Range    Anti-Nuclear Antibodies <40 <40   Complete Blood Count with Differential   Result Value Ref Range    WBC Count 5.0 3.4 - 10 x10E9/L    RBC Count 4.24 4.0 - 5.2 x10E12/L    Hemoglobin 13.6 12.0 - 15.5 g/dL    Hematocrit 39.8 36 - 46 %    MCV 94 80 - 100 fL    MCH 32.1 26 - 34 pg    MCHC 34.2 31 - 36 g/dL    Platelet Count 209 140 - 450 x10E9/L    Neutrophil Absolute Count 2.68 1.8 - 6.8 x10E9/L    Lymphocyte Abs Cnt 1.75 1.0 - 3.4 x10E9/L    Monocyte Abs Count 0.38 0.2 - 0.8 x10E9/L    Eosinophil Abs Ct 0.15 0.0 - 0.4 x10E9/L    Basophil Abs Count 0.04 0.0 - 0.1 x10E9/L    Imm Gran, Left Shift 0.01 <0.1 [id number]/D   Basic Metabolic Panel (NA, K, CL, CO2, BUN, CR, GLU, CA)   Result Value Ref Range    Urea Nitrogen, Serum / Plasma 7 6 - 22 mg/dL    Calcium, total, Serum / Plasma 8.8 8.8 - 10.3 mg/dL    Chloride, Serum / Plasma 102 97 - 108 mmol/L    Creatinine 0.58 0.44 - 1.00 mg/dL    eGFR if non-African American >120 >60 mL/min    eGFR if African Amer >120 >60 mL/min    Potassium, Serum / Plasma 3.7 3.5 - 5.1 mmol/L    Glucose, non-fasting 79 70 - 199 mg/dL    Sodium, Serum / Plasma 135 135 - 145 mmol/L    Carbon Dioxide, Total 24 22 - 32 mmol/L    Anion Gap 9 4 - 14   Hepatitis C  Antibody   Result Value Ref Range    Hepatitis C Antibody NEG NEG   Hepatitis B Surface Antigen   Result Value Ref Range    Hepatitis B Surface Antigen NEG NEG   anti-HBs (Hepatitis B Surface Antibody, Quantitative)   Result Value Ref Range    Hepatitis B Surface Antibody, Quantitative 790 mIU/mL   Hepatitis B Core Antibody, Total   Result Value Ref Range    Hepatitis B Core Antibody, Total NEG NEG   Thyroid Stimulating Hormone   Result Value Ref Range    Thyroid Stimulating Hormone 0.94 0.45 - 4.12 mIU/L   Complete Blood Count with Differential   Result Value Ref Range    WBC Count 5.4 3.4 - 10 x10E9/L    RBC Count 4.32 4.0 - 5.2 x10E12/L    Hemoglobin 13.7 12.0 - 15.5 g/dL    Hematocrit 40.5 36 - 46 %    MCV 94 80 - 100 fL    MCH 31.7 26 - 34 pg    MCHC 33.8 31 - 36 g/dL    Platelet Count 208 140 - 450 x10E9/L    Neutrophil Absolute Count 3.02 1.8 - 6.8 x10E9/L    Lymphocyte Abs Cnt 1.78 1.0 - 3.4 x10E9/L    Monocyte Abs Count 0.38 0.2 - 0.8 x10E9/L    Eosinophil Abs Ct 0.19 0.0 - 0.4 x10E9/L    Basophil Abs Count 0.04 0.0 - 0.1 x10E9/L  Imm Gran, Left Shift 0.00 <0.1 [id number]   Basic Metabolic Panel (NA, K, CL, CO2, BUN, CR, GLU, CA)   Result Value Ref Range    Urea Nitrogen, Serum / Plasma 7 6 - 22 mg/dL    Calcium, total, Serum / Plasma 9.2 8.8 - 10.3 mg/dL    Chloride, Serum / Plasma 107 97 - 108 mmol/L    Creatinine 0.61 0.44 - 1.00 mg/dL    eGFR if non-African American >120 >60 mL/min    eGFR if African Amer >120 >60 mL/min    Potassium, Serum / Plasma 3.8 3.5 - 5.1 mmol/L    Glucose, non-fasting 93 70 - 199 mg/dL    Sodium, Serum / Plasma 137 135 - 145 mmol/L    Carbon Dioxide, Total 24 22 - 32 mmol/L    Anion Gap 6 4 - 14   POCT Urine Pregnancy, >= 18 years   Result Value Ref Range    POCT Preg Test, Ur Negative Negative    Urine QC Results Passed     Test Kit Lot Number [id number]    HIV Ag/Ab Combo   Result Value Ref Range    HIV Ag/Ab Combo NEG NEG         Imaging:    PET/CT  (10/2016):    FINDINGS:  NECK  Left-sided deep parotid lymph node about 0.8 cm in short axis on  image 19/4, maximum SUV 7.6.  Left station IIa lymph nodes are small, measuring up to 7 mm in  short axis on image 23/4, with maximum SUV 4.5. Somewhat similar  small right station 2A lymph nodes.  Accentuated metabolic activity in the supraclavicular region is  thought to be due to a combination of lymph nodes but also  hypermetabolic brown fat. An index left supraclavicular lymph node  with short axis diameter of 1.1 cm on image 40/4 has a maximum  standard uptake value of 6.3.  CHEST  Enlarged and hypermetabolic bilateral axillary, bilateral  subpectoral, and prevascular adenopathy is noted. An index left  lateral axillary lymph node on image 51/4 measures 2.8 cm in short  axis and has a maximum standard uptake value of 8.0.  Port-A-Cath tip: Right atrium.  For reference purposes, background mediastinal blood pool activity  is 2.7.  ABDOMEN/PELVIS  Hypermetabolic and enlarged retrocrural, periaortic, iliac chain,  and inguinal adenopathy. An index left eccentric periaortic lymph  node measures 1.7 cm on image 112/4 and has a maximum standard  uptake value of 7.6. A left common iliac node measuring 2.2 cm on  image 139/4 has a maximum standard uptake value of 19.0. An index  left external iliac node measuring 2.2 cm in short axis on image 105  T5 of series 4 has a maximum standard uptake value of 10.1. Left  inguinal lymph node measuring 1.5 cm in short axis on image 161/4  has a maximum standard uptake value of 7.2.  For reference purposes, background liver activity SUV is 3.1.    SKELETON  Variable degrees of hypermetabolic activity within the bony  structures including the left mandible, right proximal humerus,  spine, bony pelvis, and proximal femurs. An index lesion in the left  iliac bone above the acetabulum has a maximum SUV of 11.4, this is  one of the more hypermetabolic foci although many of the lesions  have  less striking but still abnormal activity.  There appears to be a small hypermetabolic right brachial lymph node  in the right upper extremity near  the proximal humeral metaphysis  which is mildly hypermetabolic, this measures 8 mm in short axis  diameter on image 31/4 and has maximum SUV of 5.1.  IMPRESSION:  1. Extensive hypermetabolic adenopathy in the chest, abdomen, and  pelvis compatible with active lymphoma, maximum standard uptake  value measurements up to 19.0 (Deauville 5).  2. Scattered hypermetabolic activity in the axial and appendicular  skeleton, much of which is Deauville 5.  3. Level IIa lymph nodes in the neck are small and Deauville 4.      PET/CT (04/03/17):    FINDINGS:  NECK  Considerable activity in the muscular tissues of the neck and  supraclavicular adipose tissues, attributed to physiologic activity  and brown fat related activity. No enlarged lymph nodes. Faint  activity deep to the sternocleidomastoid muscles on some images is  without CT correlate other than adipose tissue, and accordingly is  not thought to be related to adenopathy.  CHEST  Bilateral mild axillary adenopathy including a 1.3 cm right axillary  lymph node (stable size) with maximum SUV 1.9 (formerly 2.0). No  appreciable pulmonary nodule. Indistinct prevascular lymph nodes are  present and somewhat indistinct, individually measuring up to 0.8 cm  in short axis as on image 52/4, subjectively these appear minimally  reduced compared the prior exam. There are foci of accentuated axillary and mediastinal activity corresponding to adipose tissues  rather than nodes, again probably from brown fat distribution. There  is also accentuated paraspinal activity in the upper thorax likely  along the intercostal spaces, 18 without CT correlate and favoring  brown fat distribution.    ABDOMEN/PELVIS  The previously seen hypermetabolic left inguinal lymph node  currently measures 0.8 cm in short axis (previously 1.0 cm by  my  measurement) and has a fatty hilum, maximum SUV 1.3 compared to  prior measurement of 4.9. A left external iliac node is mildly  enlarged at 1.1 cm in short axis on image 157/4 (previously the  same) and with maximum standard uptake value 1.8 (formerly 2.1).  No other significant adenopathy is identified. Background hepatic  activity SUV 3.3.  Contracted gallbladder.  SKELETON  Generalized mild accentuation of marrow activity, similar to prior.  IMPRESSION:  1. Bilateral mild axillary adenopathy along with the mild left  external iliac adenopathy persists, uptake is Deauville 2 and  similar to minimally reduced compared to the prior exam. Similar  Deauville 2 activity in the the remaining prevascular lymph nodes  which are upper normal sized and indistinctly marginated.  2. The previously hypermetabolic left inguinal lymph node is no  longer significantly hypermetabolic, and may have been reactive.  3. There is considerable activity in some of the muscular and fatty  tissues of the neck and chest without CT correlate, likely due to a  combination of brown fat distribution and physiologic activity.  4. Generalized mild accentuation of marrow activity, probably  related to marrow/granulocyte stimulation.  5. Prior ground-glass opacities in the lungs have resolved.    PET/CT (07/02/18):  FINDINGS:    Reference standardized uptake values:  Current exam: Cerebellar SUVmax is 16.4.   Prior PET: Not applicable    Oncologic Findings:    Index lesions:  None.    Lymph nodes: No hypermetabolic lymphadenopathy.  Lungs: No metastases.  Abdominal and pelvic organs: No metastases.  Bones: No metastases.  Other: No metastases.    Non-oncologic findings:    Brain:   Unremarkable, symmetric, FDG uptake is seen throughout the cortical gray matter, basal  ganglia and the cerebellum.  No mass effect.  While these images appear within normal limits, MRI is recommended to rule out intracranial and/or skull base metastases if  clinically indicated.    Neck:   Otherwise unremarkable.    Chest:   Otherwise unremarkable.    Abdomen/Pelvis:   Several small right inguinal lymph nodes measuring up to 3 mm in short axis without hypermetabolism.    Musculoskeletal:   Otherwise unremarkable.    IMPRESSION:     No evidence of hypermetabolic malignancy. No lymphadenopathy.    Pathology:     lymph node, left axillary, ultrasound-guided core biopsy  ([id number]; 09/18/2016)  -- CLASSICAL HODGKIN LYMPHOMA (SEE COMMENT)    I personally reviewed and interpreted all laboratory studies from this visit and discussed with the pt as documented below     ASSESSMENT & PLAN:   This is a 28 yo woman with no significant PMH, who was diagnosed with stage IV nodular sclerosing Hodgkin lymphoma in 09/2016 based on a core bx of a left axillary node.  She is s/p AAVDx6, completed on 04/03/2017.  She Has been in remission since then.  Her last PETCT was in Jan 2020 [facility] which did not show any evidence of recurrent dz.      I indicated that the mature data for the [facility] trial showed that with a median f/u of 37 months, 3-yr PFS 83%  vs 76% for the  A-AVD vs ABVD arms. HR= 0.7, p = 0.005 Earlean Shawl D et al., Blood 2020).  In addition, studies have shown that for patients who do relapse after the firs-line therapy, 80% of the relapses occur during the first 2 yrs after therapy.    Taken together,given that she is nearly 18 months out from last treatment, the likelihood of be cured is very high.    I indicated that it is not uncommon to feel overwhelmed or depressed after completion of the treatment, I am sure that the recent passing of her grandmother added additional challenges in this process.  I support her decision of engaging in psychotherapy and support groups.    I spent a total of 60 minutes face-to-face with the patient and > 50 % of that time was spent counseling regarding the natural history and the prognosis of advanced stage Hodgkin lymphoma, as well  as survivorship and management of post-treatment depression.         The case was discussed with attending physician Dr. Clide Cliff. Ai, MD.    The above plan was reviewed with the patient and all questions and issues were addressed to the patient's satisfaction.    Method of education:  Verbal and Written. Patient ready and able to be educated: Yes. Patient/family verbalized understanding of information and instructions given: Yes. Counseling performed:  Treatment options and side effects.    Kem Kays, MD  Fellow, Hematology / Oncology

## 2019-03-02 ENCOUNTER — Ambulatory Visit: Admit: 2019-03-02 | Discharge: 2019-03-02 | Payer: BLUE CROSS/BLUE SHIELD | Attending: Medical Oncology

## 2019-03-02 DIAGNOSIS — C8190 Hodgkin lymphoma, unspecified, unspecified site: Secondary | ICD-10-CM

## 2019-03-02 DIAGNOSIS — F418 Other specified anxiety disorders: Secondary | ICD-10-CM

## 2019-03-02 DIAGNOSIS — D848 Other specified immunodeficiencies: Secondary | ICD-10-CM

## 2019-03-02 DIAGNOSIS — R232 Flushing: Secondary | ICD-10-CM

## 2019-03-02 DIAGNOSIS — N926 Irregular menstruation, unspecified: Secondary | ICD-10-CM

## 2019-03-02 LAB — COMPLETE BLOOD COUNT WITH DIFF
Abs Basophils: 0.05 10*9/L (ref 0.0–0.1)
Abs Eosinophils: 0.13 10*9/L (ref 0.0–0.4)
Abs Imm Granulocytes: 0.01 10*9/L (ref <0.1)
Abs Lymphocytes: 2.17 10*9/L (ref 1.0–3.4)
Abs Monocytes: 0.38 10*9/L (ref 0.2–0.8)
Abs Neutrophils: 3.83 10*9/L (ref 1.8–6.8)
Hematocrit: 40.6 % (ref 36–46)
Hemoglobin: 14 g/dL (ref 12.0–15.5)
MCH: 31.8 pg (ref 26–34)
MCHC: 34.5 g/dL (ref 31–36)
MCV: 92 fL (ref 80–100)
Platelet Count: 240 10*9/L (ref 140–450)
RBC Count: 4.4 10*12/L (ref 4.0–5.2)
WBC Count: 6.6 10*9/L (ref 3.4–10)

## 2019-03-02 NOTE — Progress Notes (Signed)
ID: Debbie Grant is a 28 y.o. female  Who is here for f/u of her   Hodgkin lymphoma, unspecified Hodgkin lymphoma type, unspecified body region (CMS code)    -     Complete Blood Count with Differential        ONCOLOGICAL HISTORY:  1. 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    => 09/24/16 Left axillary lymph node biopsy showing classical HL.   => 09/2016 Started on A-AVD.  => 10/10/2016 PET/CT showed Extensive hypermetabolic adenopathy in the chest, abdomen, and pelvis compatible with active lymphoma, maximum standard uptake value measurements up to 19.0 (Deauville 5).  => 12/2016 PET/CT after 3 cycles of treatment showing excellent respose to treatment  =>04/03/17 PET/CT after 6 cycles of treatment showed no disease greater than DS 2   => 07/02/2018 PETCT: no e/o malignancy      INTERVAL HISTORY:  -irregular menstrual cycles - one lasted throughout July and a part of  August.  No period in September  -has  hot flashes now  - fatigue  - no pruritus  - no pain after alcohol  - no cough  - no pain    ROS:    Constitutional/general: hot flashes , no B symptoms   Pulm: no SOB, no cough  CV: no chest pain or papitation  Lymph nodes: none   GI: no N/V, no melena  MS: no edema, no joint pain   Neuro: no neuropathy  Skin: warm and dry without lesions  Endo: irregular menstrual cycles    The remainder of a 14-point ROS was reviewed and was otherwise negative.        Past medical, family and social histories, as well as medications and allergies, were reviewed and updated in the medical records as appropriate.      Current Outpatient Medications   Medication Sig Dispense Refill    albuterol 90 mcg/actuation metered dose inhaler Inhale 2 puffs into the lungs.      amoxicillin-clavulanate (AUGMENTIN) 875-125 mg tablet Twice a day      ascorbic acid, vitamin C, 500 mg TABSR Take by mouth       aspirin-acetaminophen-caffeine (EXCEDRIN MIGRAINE) 250-250-65 mg tablet Take 1 tablet by mouth      cyanocobalamin, vitamin B-12, 1,000 mcg TABSR Take by mouth      diphenhydrAMINE-acetaminophen (TYLENOL PM) 25-500 mg tablet Take by mouth      EPINEPHrine (EPIPEN) 0.3 mg/0.3 mL injection Inject into the muscle      hydroCHLOROthiazide (HYDRODIURIL) 25 mg tablet Take 25 mg by mouth      loratadine (CLARITIN) 10 mg tablet Take 10 mg by mouth Daily.      omeprazole (PRILOSEC) 20 mg capsule Take 40 mg by mouth      senna-docusate (PERICOLACE) 8.6-50 mg tablet Take 2 tablets by mouth       No current facility-administered medications for this visit.          PE:  Vitals:    03/02/19 1238   BP: (!) 139/95   Pulse: 57   Resp: 18   Temp: 36.7 C (98 F)   TempSrc: Temporal   SpO2: 100%   Weight: 81.1 kg (178 lb 12.7 oz)   Height: 156.2 cm (5' 1.5")       ECOG 0  Consitutional: well nourished, well developed, NAD  HEENT: no mucositis,  no thrush   Eyes: PERRL, EOM intact  Neck: supple, normal ROM   Lungs: clear b/l, no wheezes or crackles  Cardiac: RRR, normal S1, S2, no MRG  Abd: soft, non-tender, no hepatosplenomegaly   Ext: no edema, no joint tenderness   Lymph nodes: no adenopathy in the cervical or axillary areas, no masses   Skin: No rashes, ecchymoses, or petechiae.  Psych: normal affect, appropriate mood                 Labs  Lab Results   Component Value Date    WBC 6.6 03/02/2019    RBC 4.40 03/02/2019    HGB 14.0 03/02/2019    HCT 40.6 03/02/2019    MCV 92 03/02/2019    MCH 31.8 03/02/2019    MCHC 34.5 03/02/2019    PLT 240 03/02/2019     Lab Results   Component Value Date    Creatinine 0.61 07/02/2018     Lab Results   Component Value Date    Alanine transaminase 16 06/30/2018    Aspartate transaminase 25 06/30/2018    Alkaline Phosphatase 53 06/30/2018    Bilirubin, Total 0.7 06/30/2018       I have personally reviewed and interpreted labs and discussed with pt as documented below      Assessment and  Plan:    1. Hodgkin lymphoma :  High risk disease.  Now in remission. Monitoring for recurrence.  Established, stable   Stage IV    76 yF with history of Stage IV classical Hodgkin's lymphoma s/p A-AVD x6 (completed 04/03/2017) presents here to establish further care.    => 09/24/16 Left axillary lymph node biopsy showing classical HL.   => 09/2016 Started on A-AVD.  => 10/10/2016 PET/CT showed Extensive hypermetabolic adenopathy in the chest, abdomen, and pelvis compatible with active lymphoma, maximum standard uptake value measurements up to 19.0 (Deauville 5).  => 12/2016 PET/CT after 3 cycles of treatment showing excellent respose to treatment  =>04/03/17 PET/CT after 6 cycles of treatment showed no disease greater than DS 2   => 07/02/2018 PETCT: no e/o malignancy    - stage IV HL  - s/p AVD x6, 2 yrs in remission  - labs satisfactory  -no LAN  - cont in clinical remisison  - no scans needed    2. Irregular menstrual cycles   - new   - DDX: due to prior chemo vs fibroids vs malignancy   - referral to GYN placed today    3. Anxiety/depression  - EStablished, stable  - cont seeing a counselor    4. Hot flashes  -new  - likely due to prior chemo  -defer effexore for now  -referral to gyn

## 2019-04-29 IMAGING — DX DG CHEST 1V PORT
1 series · 1 of 1 positions shown · non-contrast
Comparison: PET-CT 10/10/2016

CLINICAL DATA: Shortness of breath and fatigue for 2 days. Recent
chemotherapy for new diagnosis Hodgkin's lymphoma.

EXAM:
PORTABLE CHEST 1 VIEW

[chest ap]
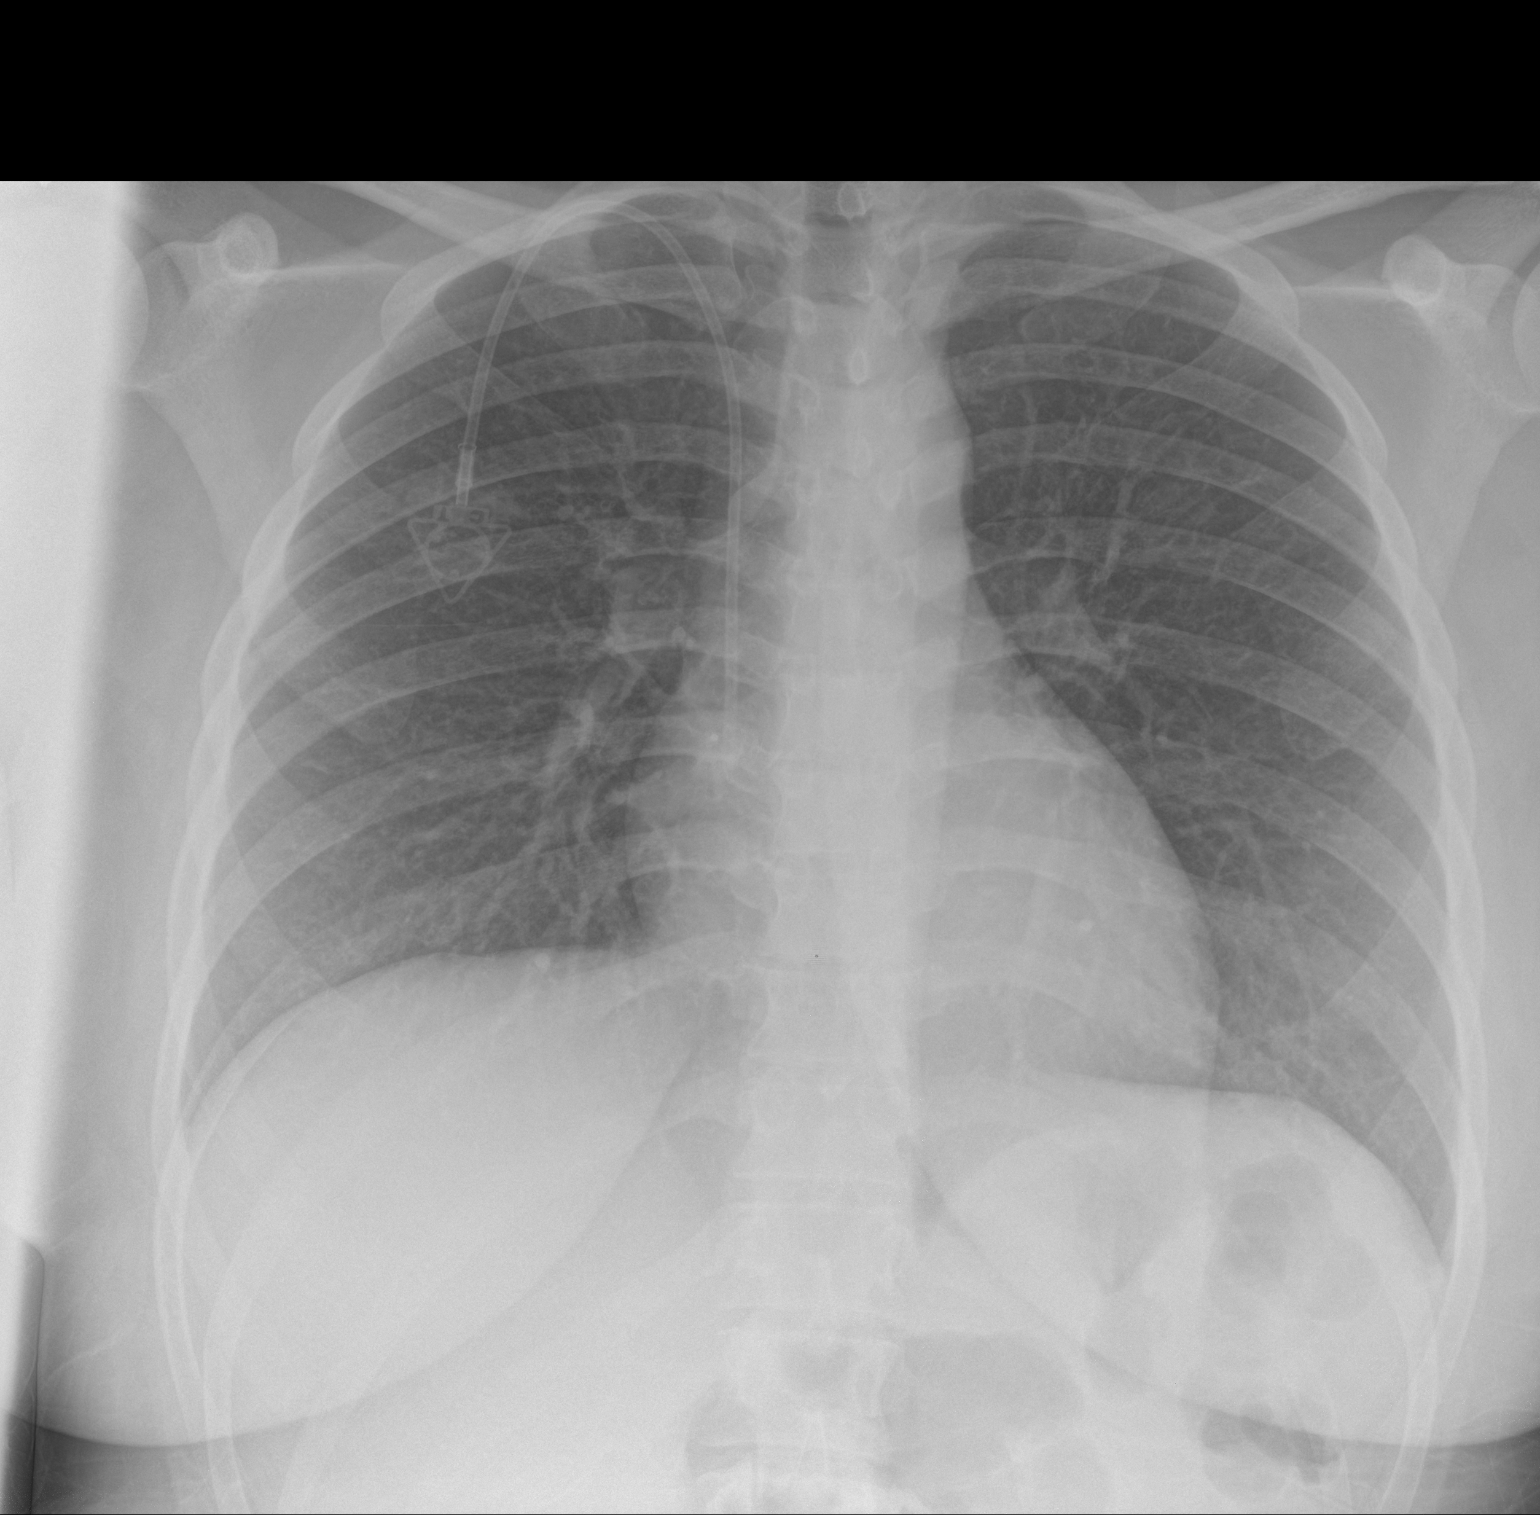

[1 of 1 positions shown; findings below may reference images not displayed]

FINDINGS: Power port type central venous catheter with tip over the low SVC
region. No pneumothorax. Normal heart size and pulmonary
vascularity. No focal airspace disease or consolidation in the
lungs. No blunting of costophrenic angles. No pneumothorax.
Mediastinal contours appear intact.
IMPRESSION: No evidence of active pulmonary disease.

## 2019-07-31 IMAGING — CT NM PET TUM IMG RESTAG (PS) SKULL BASE T - THIGH
1 of 8 series · 1 of 25 positions shown · non-contrast
Comparison: Multiple exams, including 01/02/2017

CLINICAL DATA: Subsequent treatment strategy for skin lymphoma.

EXAM:
NUCLEAR MEDICINE PET SKULL BASE TO THIGH
TECHNIQUE: 9.1 mCi F-18 FDG was injected intravenously. Full-ring PET imaging
was performed from the skull base to thigh after the radiotracer. CT
data was obtained and used for attenuation correction and anatomic
localization.
FASTING BLOOD GLUCOSE:  Value: 102 mg/dl

[Series 4: ct sk_thigh 5.0 b31f · axial · 5.0mm · 0.98mm/px · 1 of 206 slices shown]
[im 206/206  brain]
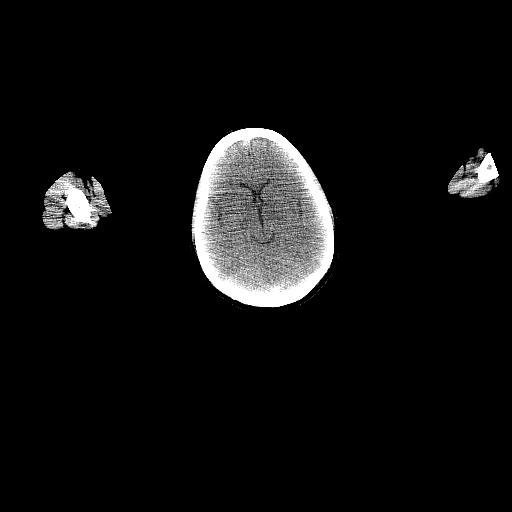

[1 of 25 positions shown; findings below may reference images not displayed]

FINDINGS: NECK

Considerable activity in the muscular tissues of the neck and
supraclavicular adipose tissues, attributed to physiologic activity
and brown fat related activity. No enlarged lymph nodes. Faint
activity deep to the sternocleidomastoid muscles on some images is
without CT correlate other than adipose tissue, and accordingly is
not thought to be related to adenopathy.

CHEST

Bilateral mild axillary adenopathy including a 1.3 cm right axillary
lymph node (stable size) with maximum SUV 1.9 (formerly 2.0). No
appreciable pulmonary nodule. Indistinct prevascular lymph nodes are
present and somewhat indistinct, individually measuring up to 0.8 cm
in short axis as on image 52/4, subjectively these appear minimally
reduced compared the prior exam. There are foci of accentuated
axillary and mediastinal activity corresponding to adipose tissues
rather than nodes, again probably from brown fat distribution. There
is also accentuated paraspinal activity in the upper thorax likely
along the intercostal spaces, 18 without CT correlate and favoring
brown fat distribution.

Right Port-A-Cath tip: Right atrium. The prior ground-glass
opacities in the lungs have resolved.

Background mediastinal blood pool activity 2.4.

ABDOMEN/PELVIS

The previously seen hypermetabolic left inguinal lymph node
currently measures 0.8 cm in short axis (previously 1.0 cm by my
measurement) and has a fatty hilum, maximum SUV 1.3 compared to
prior measurement of 4.9. A left external iliac node is mildly
enlarged at 1.1 cm in short axis on image 157/4 (previously the
same) and with maximum standard uptake value 1.8 (formerly 2.1).

No other significant adenopathy is identified. Background hepatic
activity SUV 3.3.

Contracted gallbladder.

SKELETON

Generalized mild accentuation of marrow activity, similar to prior.
IMPRESSION: 1. Bilateral mild axillary adenopathy along with the mild left
external iliac adenopathy persists, uptake is [HOSPITAL] 2 and
similar to minimally reduced compared to the prior exam. Similar
[HOSPITAL] 2 activity in the the remaining prevascular lymph nodes
which are upper normal sized and indistinctly marginated.
2. The previously hypermetabolic left inguinal lymph node is no
longer significantly hypermetabolic, and may have been reactive.
3. There is considerable activity in some of the muscular and fatty
tissues of the neck and chest without CT correlate, likely due to a
combination of brown fat distribution and physiologic activity.
4. Generalized mild accentuation of marrow activity, probably
related to marrow/granulocyte stimulation.
5. Prior ground-glass opacities in the lungs have resolved.

## 2024-01-06 ENCOUNTER — Ambulatory Visit
Admission: EM | Admit: 2024-01-06 | Discharge: 2024-01-06 | Disposition: A | Discharge status: Home / Self Care | Attending: Internal Medicine | Admitting: Internal Medicine

## 2024-01-06 DIAGNOSIS — M545 Low back pain, unspecified: Secondary | ICD-10-CM | POA: Diagnosis not present

## 2024-01-06 DIAGNOSIS — Z87440 Personal history of urinary (tract) infections: Secondary | ICD-10-CM | POA: Diagnosis not present

## 2024-01-06 DIAGNOSIS — R109 Unspecified abdominal pain: Secondary | ICD-10-CM | POA: Diagnosis not present

## 2024-01-06 DIAGNOSIS — R1012 Left upper quadrant pain: Secondary | ICD-10-CM | POA: Insufficient documentation

## 2024-01-06 DIAGNOSIS — R112 Nausea with vomiting, unspecified: Secondary | ICD-10-CM | POA: Insufficient documentation

## 2024-01-06 DIAGNOSIS — R197 Diarrhea, unspecified: Secondary | ICD-10-CM | POA: Insufficient documentation

## 2024-01-06 DIAGNOSIS — Z862 Personal history of diseases of the blood and blood-forming organs and certain disorders involving the immune mechanism: Secondary | ICD-10-CM | POA: Insufficient documentation

## 2024-01-06 DIAGNOSIS — Z8571 Personal history of Hodgkin lymphoma: Secondary | ICD-10-CM | POA: Diagnosis not present

## 2024-01-06 LAB — POCT URINE DIPSTICK
Bilirubin, UA: NEGATIVE
Blood, UA: NEGATIVE
Glucose, UA: NEGATIVE mg/dL
Leukocytes, UA: NEGATIVE
Nitrite, UA: NEGATIVE
POC PROTEIN,UA: NEGATIVE
Spec Grav, UA: 1.025 (ref 1.010–1.025)
Urobilinogen, UA: 0.2 U/dL
pH, UA: 6.5 (ref 5.0–8.0)

## 2024-01-06 NOTE — ED Provider Notes (Addendum)
 [facility]    CSN: [id number] Arrival date & time: 01/06/24  1430      History   Chief Complaint Chief Complaint  Patient presents with   Abdominal Pain    HPI Leah Floyd is a 33 y.o. female.   Patient presents with nausea, vomiting, diarrhea, abdominal pain that started this morning. Abdominal pain is located in the left upper quadrant, is rated 5/10 on pain scale, and is described as a cramping pain. Denies blood in stool or emesis. Denies fever but reports body aches and chills. Denies any known sick contacts, any recent unfavorable foods, or recent travel outside the [place] . She did take a zofran  around 12:00 which provided improvement in nausea and she has been able to tolerate some fluids. Last menstrual cycle just completed per patient. She is sexually active with a female partner so patient has no concern for pregnancy. She is currently urinating normally. She just completed treatment for a UTI about 5-6 days ago after taking nitrofurantoin. Those urinary symptoms have resolved but she is having pain across her lower back that started with gastrointestinal symptoms. Pertinent medical history includes iron deficiency anemia and Hogkin's Lymphoma where she has completed treatment.    Abdominal Pain   Past Medical History:  Diagnosis Date   Cancer [facility])    lymphoma     Patient Active Problem List   Diagnosis Date Noted   Iron deficiency anemia due to chronic blood loss 10/12/2016   Hodgkin's lymphoma (HCC) 10/10/2016    Past Surgical History:  Procedure Laterality Date   ANTERIOR CRUCIATE LIGAMENT REPAIR     left   HERNIA REPAIR     left   IR CV LINE INJECTION  03/05/2017   IR FLUORO GUIDE PORT INSERTION RIGHT  10/04/2016   IR REMOVAL TUN ACCESS W/ PORT W/O FL MOD SED  04/23/2017   IR US  GUIDE VASC ACCESS RIGHT  10/04/2016   TONSILLECTOMY      OB History   No obstetric history on file.      Home Medications    Prior to Admission  medications   Medication Sig Start Date End Date Taking? Authorizing Provider  albuterol  (PROVENTIL  HFA;VENTOLIN  HFA) 108 (90 Base) MCG/ACT inhaler Inhale 2 puffs into the lungs every 6 (six) hours as needed for wheezing or shortness of breath. 10/03/16  Yes Onesimo Emaline Brink, MD  cetirizine (ZYRTEC) 10 MG tablet Take 10 mg by mouth.   Yes [provider]  omeprazole (PRILOSEC) 20 MG capsule Take 40 mg by mouth.   Yes [provider]  Ascorbic Acid (VITAMIN C CR) 500 MG TBCR Take by mouth.    [provider]  Ascorbic Acid (VITAMIN C PO) Take by mouth. Patient not taking: Reported on 01/06/2024    [provider]  buPROPion (WELLBUTRIN XL) 150 MG 24 hr tablet Take 150 mg by mouth. Patient not taking: Reported on 01/06/2024 12/10/22   [provider]  Cyanocobalamin (VITAMIN B-12 CR PO) Take 1 tablet by mouth daily. Patient not taking: Reported on 01/06/2024    [provider]  diphenhydramine-acetaminophen  (TYLENOL  PM) 25-500 MG TABS tablet Take 2 tablets by mouth at bedtime as needed.    [provider]  EPINEPHrine  0.3 mg/0.3 mL IJ SOAJ injection Inject 0.3 mLs (0.3 mg total) into the muscle once. For anaphylactic reactions 10/03/16 01/16/17  Kale, Gautam Kishore, MD  hydrochlorothiazide (HYDRODIURIL) 25 MG tablet Take 25 mg by mouth. Patient not taking: Reported on 01/06/2024  02/10/15   [provider]  loratadine (CLARITIN) 10 MG tablet Take 10 mg by mouth daily.    [provider]  senna-docusate (SENNA S) 8.6-50 MG tablet Take 2 tablets by mouth at bedtime. Hold if diarrhea ensues 01/18/17   Onesimo Emaline Brink, MD    Family History History reviewed. No pertinent family history.  Social History Social History   Tobacco Use   Smoking status: Never   Smokeless tobacco: Never  Vaping Use   Vaping status: Never Used  Substance Use Topics   Alcohol use: Never    Comment: socially   Drug use: Yes    Types: Marijuana     Comment: medical      Allergies   Shellfish-derived products   Review of Systems Review of Systems Per HPI  Physical Exam Triage Vital Signs ED Triage Vitals  Encounter Vitals Group     BP 01/06/24 1437 126/87     Girls Systolic BP Percentile --      Girls Diastolic BP Percentile --      Boys Systolic BP Percentile --      Boys Diastolic BP Percentile --      Pulse Rate 01/06/24 1437 80     Resp 01/06/24 1437 20     Temp 01/06/24 1437 98.3 F (36.8 C)     Temp Source 01/06/24 1437 Oral     SpO2 01/06/24 1437 97 %     Weight --      Height --      Head Circumference --      Peak Flow --      Pain Score 01/06/24 1438 7     Pain Loc --      Pain Education --      Exclude from Growth Chart --    No data found.  Updated Vital Signs BP 126/87 (BP Location: Right Arm)   Pulse 80   Temp 98.3 F (36.8 C) (Oral)   Resp 20   LMP 12/29/2023 (Exact Date)   SpO2 97%   Visual Acuity Right Eye Distance:   Left Eye Distance:   Bilateral Distance:    Right Eye Near:   Left Eye Near:    Bilateral Near:     Physical Exam Constitutional:      General: She is not in acute distress.    Appearance: Normal appearance. She is ill-appearing. She is not toxic-appearing or diaphoretic.  HENT:     Head: Normocephalic and atraumatic.     Mouth/Throat:     Mouth: Mucous membranes are moist.     Pharynx: No posterior oropharyngeal erythema.  Eyes:     Extraocular Movements: Extraocular movements intact.     Conjunctiva/sclera: Conjunctivae normal.  Cardiovascular:     Rate and Rhythm: Normal rate and regular rhythm.     Pulses: Normal pulses.     Heart sounds: Normal heart sounds.  Pulmonary:     Effort: Pulmonary effort is normal. No respiratory distress.     Breath sounds: Normal breath sounds.  Abdominal:     General: Bowel sounds are normal. There is no distension.     Palpations: Abdomen is soft.     Tenderness: There is abdominal tenderness in the left upper  quadrant. There is no guarding.     Comments: Mild discomfort per patient report with palpation to left upper quadrant of abdomen.   Neurological:     General: No focal deficit present.     Mental Status: She  is alert and oriented to person, place, and time. Mental status is at baseline.  Psychiatric:        Mood and Affect: Mood normal.        Behavior: Behavior normal.        Thought Content: Thought content normal.        Judgment: Judgment normal.      UC Treatments / Results  Labs (all labs ordered are listed, but only abnormal results are displayed) Labs Reviewed  POCT URINE DIPSTICK - Abnormal; Notable for the following components:      Result Value   Color, UA straw (*)    Clarity, UA hazy (*)    Ketones, POC UA small (15) (*)    All other components within normal limits  URINE CULTURE  CBC  COMPREHENSIVE METABOLIC PANEL WITH GFR  LIPASE    EKG   Radiology No results found.  Procedures Procedures (including critical care time)  Medications Ordered in UC Medications - No data to display  Initial Impression / Assessment and Plan / UC Course  I have reviewed the triage vital signs and the nursing notes.  Pertinent labs & imaging results that were available during my care of the patient were reviewed by me and considered in my medical decision making (see chart for details).     Vital signs are stable and there are no signs of acute abdomen or dehydration on physical exam that would warrant imaging or ER evaluation. Suspect viral versus food related illness. Patient reports some improvement with zofran  so encouraged her to continue this as needed. She states that she has some leftover at home. UA completed to ensure symptoms are not related to persisted UTI. UA was unremarkable but will send urine culture to confirm. CMP, CBC, lipase pending. Awaiting results. Also discussed with patient ensuring she is drinking plenty of clear fluids and eating a bland diet.  Urine pregnancy was deferred as patient states that she is sexually active with a female partner. She was given strict ER precautions if symptoms persist or worsen especially abdominal pain as we do not have CT or ultrasound imaging capabilities at urgent care. Patient verbalized understanding and was agreeable with plan.  Final Clinical Impressions(s) / UC Diagnoses   Final diagnoses:  Flank pain  Nausea vomiting and diarrhea  Left upper quadrant abdominal pain     Discharge Instructions      It seems that you may have a viral illness causing your symptoms. Continue nausea medication. Urine was clear but will send urine for culture to confirm. Blood work is pending. Will call only if it is abnormal. Ensure you are drinking plenty of clear fluids and eat a bland diet as we discussed. GO TO THE ER IF SYMPTOMS PERSIST OR WORSEN!    ED Prescriptions   None    PDMP not reviewed this encounter.   Hazen Darryle BRAVO, [place] 01/06/24 1525    [address], [place] 01/06/24 [phone]

## 2024-01-06 NOTE — ED Triage Notes (Signed)
 Pt c/o abdominal pain, lower back pain,diarrhea,nausea and vomiting since this morning.Took zofran  with no relief

## 2024-01-06 NOTE — Discharge Instructions (Signed)
 It seems that you may have a viral illness causing your symptoms. Continue nausea medication. Urine was clear but will send urine for culture to confirm. Blood work is pending. Will call only if it is abnormal. Ensure you are drinking plenty of clear fluids and eat a bland diet as we discussed. GO TO THE ER IF SYMPTOMS PERSIST OR WORSEN!

## 2024-01-07 ENCOUNTER — Telehealth: Payer: Self-pay

## 2024-01-07 ENCOUNTER — Ambulatory Visit: Payer: Self-pay | Admitting: Internal Medicine

## 2024-01-07 LAB — COMPREHENSIVE METABOLIC PANEL WITH GFR

## 2024-01-07 LAB — LIPASE: Lipase: 19 U/L (ref 14–72)

## 2024-01-07 LAB — URINE CULTURE: Culture: <10000 — AB

## 2024-01-07 LAB — CBC
Hematocrit: 42.4 % (ref 34.0–46.6)
Hemoglobin: 13.5 g/dL (ref 11.1–15.9)
MCH: 29 pg (ref 26.6–33.0)
MCHC: 31.8 g/dL (ref 31.5–35.7)
MCV: 91 fL (ref 79–97)
Platelets: 313 x10E3/uL (ref 150–450)
RBC: 4.66 x10E6/uL (ref 3.77–5.28)
RDW: 14.8 % (ref 11.7–15.4)
WBC: 10.3 x10E3/uL (ref 3.4–10.8)

## 2024-01-07 NOTE — Telephone Encounter (Signed)
 CMP was canceled, called [facility] to verify. [facility] states there was insufficient specimen to run the CMP. [facility] recommends re-drawing the cmp. Nurse calling patient to notify.

## 2024-01-07 NOTE — Telephone Encounter (Signed)
 Called pt to talk to her about blood sample recollection.Pt did not answer the phone.

## 2024-01-30 LAB — COMPREHENSIVE METABOLIC PANEL WITH GFR
ALT: 10 IU/L (ref 0–32)
AST: 18 IU/L (ref 0–40)
Albumin: 4.1 g/dL (ref 3.9–4.9)
Alkaline Phosphatase: 71 IU/L (ref 44–121)
BUN/Creatinine Ratio: 10 (ref 9–23)
BUN: 7 mg/dL (ref 6–20)
Bilirubin Total: <0.2 mg/dL (ref 0.0–1.2)
CO2: 16 mmol/L — ABNORMAL LOW (ref 20–29)
Calcium: 9.9 mg/dL (ref 8.7–10.2)
Chloride: 103 mmol/L (ref 96–106)
Creatinine, Ser: 0.7 mg/dL (ref 0.57–1.00)
Globulin, Total: 3.2 g/dL (ref 1.5–4.5)
Glucose: 109 mg/dL — ABNORMAL HIGH (ref 70–99)
Potassium: 4 mmol/L (ref 3.5–5.2)
Sodium: 137 mmol/L (ref 134–144)
Total Protein: 7.3 g/dL (ref 6.0–8.5)
eGFR: 117 mL/min/1.73 (ref >59)

## 2024-01-30 LAB — SPECIMEN STATUS REPORT
